# Patient Record
Sex: Male | Born: 1940
Health system: Southern US, Community
[De-identification: ages and names within clinical notes are randomized; demographics above are authoritative.]

## PROBLEM LIST (undated history)

## (undated) DIAGNOSIS — N189 Chronic kidney disease, unspecified: Secondary | ICD-10-CM

## (undated) DIAGNOSIS — E785 Hyperlipidemia, unspecified: Secondary | ICD-10-CM

## (undated) DIAGNOSIS — E119 Type 2 diabetes mellitus without complications: Secondary | ICD-10-CM

## (undated) DIAGNOSIS — I509 Heart failure, unspecified: Secondary | ICD-10-CM

## (undated) DIAGNOSIS — J449 Chronic obstructive pulmonary disease, unspecified: Secondary | ICD-10-CM

## (undated) DIAGNOSIS — I350 Nonrheumatic aortic (valve) stenosis: Secondary | ICD-10-CM

## (undated) DIAGNOSIS — G629 Polyneuropathy, unspecified: Secondary | ICD-10-CM

## (undated) DIAGNOSIS — R918 Other nonspecific abnormal finding of lung field: Secondary | ICD-10-CM

## (undated) HISTORY — PX: TOTAL KNEE ARTHROPLASTY: SHX125

## (undated) HISTORY — PX: ROTATOR CUFF REPAIR: SHX139

## (undated) SURGERY — EGD (ESOPHAGOGASTRODUODENOSCOPY)
Anesthesia: Monitor Anesthesia Care

---

## 2007-08-20 DIAGNOSIS — Z96619 Presence of unspecified artificial shoulder joint: Secondary | ICD-10-CM | POA: Insufficient documentation

## 2009-12-01 ENCOUNTER — Inpatient Hospital Stay (HOSPITAL_COMMUNITY): Admission: RE | Admit: 2009-12-01 | Discharge: 2009-12-06 | Payer: Self-pay | Admitting: Orthopaedic Surgery

## 2010-09-05 LAB — BASIC METABOLIC PANEL
BUN: 7 mg/dL (ref 6–23)
BUN: 8 mg/dL (ref 6–23)
CO2: 25 mEq/L (ref 19–32)
GFR calc Af Amer: >60 mL/min (ref >60)
Glucose, Bld: 118 mg/dL — ABNORMAL HIGH (ref 70–99)
Potassium: 4.1 mEq/L (ref 3.5–5.1)
Potassium: 4.1 mEq/L (ref 3.5–5.1)

## 2010-09-05 LAB — GLUCOSE, CAPILLARY
Glucose-Capillary: 128 mg/dL — ABNORMAL HIGH (ref 70–99)
Glucose-Capillary: 133 mg/dL — ABNORMAL HIGH (ref 70–99)
Glucose-Capillary: 137 mg/dL — ABNORMAL HIGH (ref 70–99)
Glucose-Capillary: 146 mg/dL — ABNORMAL HIGH (ref 70–99)
Glucose-Capillary: 152 mg/dL — ABNORMAL HIGH (ref 70–99)
Glucose-Capillary: 165 mg/dL — ABNORMAL HIGH (ref 70–99)
Glucose-Capillary: 171 mg/dL — ABNORMAL HIGH (ref 70–99)
Glucose-Capillary: 220 mg/dL — ABNORMAL HIGH (ref 70–99)
Glucose-Capillary: 70 mg/dL (ref 70–99)
Glucose-Capillary: 89 mg/dL (ref 70–99)

## 2010-09-05 LAB — CBC
HCT: 32.7 % — ABNORMAL LOW (ref 39.0–52.0)
Hemoglobin: 11.1 g/dL — ABNORMAL LOW (ref 13.0–17.0)
Platelets: 149 10*3/uL — ABNORMAL LOW (ref 150–400)
Platelets: 197 10*3/uL (ref 150–400)
RBC: 3.41 MIL/uL — ABNORMAL LOW (ref 4.22–5.81)
RDW: 16 % — ABNORMAL HIGH (ref 11.5–15.5)
RDW: 16.1 % — ABNORMAL HIGH (ref 11.5–15.5)
RDW: 16.2 % — ABNORMAL HIGH (ref 11.5–15.5)
WBC: 7.4 10*3/uL (ref 4.0–10.5)
WBC: 7.6 10*3/uL (ref 4.0–10.5)
WBC: 8.2 10*3/uL (ref 4.0–10.5)

## 2010-09-05 LAB — PROTIME-INR
INR: 1.1 (ref 0.00–1.49)
INR: 1.79 — ABNORMAL HIGH (ref 0.00–1.49)
INR: 2.07 — ABNORMAL HIGH (ref 0.00–1.49)
Prothrombin Time: 14.1 seconds (ref 11.6–15.2)
Prothrombin Time: 20.6 seconds — ABNORMAL HIGH (ref 11.6–15.2)

## 2010-09-06 LAB — COMPREHENSIVE METABOLIC PANEL
ALT: 14 U/L (ref 0–53)
AST: 19 U/L (ref 0–37)
Albumin: 4.1 g/dL (ref 3.5–5.2)
Alkaline Phosphatase: 65 U/L (ref 39–117)
BUN: 10 mg/dL (ref 6–23)
Chloride: 107 mEq/L (ref 96–112)
GFR calc Af Amer: >60 mL/min (ref >60)
Potassium: 4.3 mEq/L (ref 3.5–5.1)
Sodium: 135 mEq/L (ref 135–145)
Total Protein: 6.7 g/dL (ref 6.0–8.3)

## 2010-09-06 LAB — DIFFERENTIAL
Basophils Relative: 4 % — ABNORMAL HIGH (ref 0–1)
Eosinophils Relative: 3 % (ref 0–5)
Monocytes Absolute: 0.6 10*3/uL (ref 0.1–1.0)
Monocytes Relative: 11 % (ref 3–12)
Neutro Abs: 2.4 10*3/uL (ref 1.7–7.7)

## 2010-09-06 LAB — URINALYSIS, ROUTINE W REFLEX MICROSCOPIC
Bilirubin Urine: NEGATIVE
Glucose, UA: NEGATIVE mg/dL
Nitrite: NEGATIVE
Nitrite: NEGATIVE
Specific Gravity, Urine: 1.014 (ref 1.005–1.030)
Specific Gravity, Urine: 1.015 (ref 1.005–1.030)
pH: 5.5 (ref 5.0–8.0)
pH: 5.5 (ref 5.0–8.0)

## 2010-09-06 LAB — CBC
Platelets: 220 10*3/uL (ref 150–400)
RDW: 16.7 % — ABNORMAL HIGH (ref 11.5–15.5)
WBC: 5.6 10*3/uL (ref 4.0–10.5)

## 2010-09-06 LAB — URINE CULTURE
Colony Count: 30000
Colony Count: NO GROWTH

## 2010-09-06 LAB — GLUCOSE, CAPILLARY
Glucose-Capillary: 132 mg/dL — ABNORMAL HIGH (ref 70–99)
Glucose-Capillary: 134 mg/dL — ABNORMAL HIGH (ref 70–99)

## 2010-09-06 LAB — TYPE AND SCREEN: ABO/RH(D): O POS

## 2010-09-06 LAB — APTT: aPTT: 35 seconds (ref 24–37)

## 2011-07-20 DIAGNOSIS — I10 Essential (primary) hypertension: Secondary | ICD-10-CM | POA: Insufficient documentation

## 2011-07-27 DIAGNOSIS — M1990 Unspecified osteoarthritis, unspecified site: Secondary | ICD-10-CM | POA: Insufficient documentation

## 2011-07-27 DIAGNOSIS — M19011 Primary osteoarthritis, right shoulder: Secondary | ICD-10-CM | POA: Insufficient documentation

## 2011-07-27 DIAGNOSIS — E1149 Type 2 diabetes mellitus with other diabetic neurological complication: Secondary | ICD-10-CM | POA: Insufficient documentation

## 2011-07-31 DIAGNOSIS — I351 Nonrheumatic aortic (valve) insufficiency: Secondary | ICD-10-CM | POA: Insufficient documentation

## 2012-05-31 DIAGNOSIS — I739 Peripheral vascular disease, unspecified: Secondary | ICD-10-CM | POA: Insufficient documentation

## 2014-09-09 ENCOUNTER — Emergency Department (HOSPITAL_COMMUNITY): Payer: Medicare HMO

## 2014-09-09 ENCOUNTER — Encounter (HOSPITAL_COMMUNITY): Payer: Self-pay | Admitting: Neurology

## 2014-09-09 ENCOUNTER — Emergency Department (HOSPITAL_COMMUNITY)
Admission: EM | Admit: 2014-09-09 | Discharge: 2014-09-09 | Disposition: A | Discharge status: Home / Self Care | Payer: Medicare HMO | Attending: Emergency Medicine | Admitting: Emergency Medicine

## 2014-09-09 DIAGNOSIS — M25562 Pain in left knee: Secondary | ICD-10-CM | POA: Insufficient documentation

## 2014-09-09 DIAGNOSIS — I959 Hypotension, unspecified: Secondary | ICD-10-CM

## 2014-09-09 DIAGNOSIS — I9589 Other hypotension: Secondary | ICD-10-CM | POA: Diagnosis not present

## 2014-09-09 DIAGNOSIS — G629 Polyneuropathy, unspecified: Secondary | ICD-10-CM | POA: Diagnosis not present

## 2014-09-09 DIAGNOSIS — Z791 Long term (current) use of non-steroidal anti-inflammatories (NSAID): Secondary | ICD-10-CM | POA: Diagnosis not present

## 2014-09-09 DIAGNOSIS — Z79899 Other long term (current) drug therapy: Secondary | ICD-10-CM | POA: Diagnosis not present

## 2014-09-09 DIAGNOSIS — E119 Type 2 diabetes mellitus without complications: Secondary | ICD-10-CM | POA: Insufficient documentation

## 2014-09-09 HISTORY — DX: Type 2 diabetes mellitus without complications: E11.9

## 2014-09-09 HISTORY — DX: Polyneuropathy, unspecified: G62.9

## 2014-09-09 LAB — CBC WITH DIFFERENTIAL/PLATELET
BASOS PCT: 0 % (ref 0–1)
Basophils Absolute: 0 10*3/uL (ref 0.0–0.1)
EOS ABS: 0.1 10*3/uL (ref 0.0–0.7)
Eosinophils Relative: 2 % (ref 0–5)
HEMATOCRIT: 32.2 % — AB (ref 39.0–52.0)
Hemoglobin: 10.9 g/dL — ABNORMAL LOW (ref 13.0–17.0)
Lymphocytes Relative: 31 % (ref 12–46)
Lymphs Abs: 1.6 10*3/uL (ref 0.7–4.0)
MCH: 28.7 pg (ref 26.0–34.0)
MCHC: 33.9 g/dL (ref 30.0–36.0)
MCV: 84.7 fL (ref 78.0–100.0)
MONO ABS: 0.7 10*3/uL (ref 0.1–1.0)
MONOS PCT: 14 % — AB (ref 3–12)
Neutro Abs: 2.7 10*3/uL (ref 1.7–7.7)
Neutrophils Relative %: 53 % (ref 43–77)
Platelets: 225 10*3/uL (ref 150–400)
RBC: 3.8 MIL/uL — AB (ref 4.22–5.81)
RDW: 15.2 % (ref 11.5–15.5)
WBC: 5.1 10*3/uL (ref 4.0–10.5)

## 2014-09-09 LAB — URINALYSIS, ROUTINE W REFLEX MICROSCOPIC
BILIRUBIN URINE: NEGATIVE
HGB URINE DIPSTICK: NEGATIVE
KETONES UR: NEGATIVE mg/dL
LEUKOCYTES UA: NEGATIVE
Nitrite: NEGATIVE
Protein, ur: NEGATIVE mg/dL
SPECIFIC GRAVITY, URINE: 1.019 (ref 1.005–1.030)
Urobilinogen, UA: 0.2 mg/dL (ref 0.0–1.0)
pH: 5.5 (ref 5.0–8.0)

## 2014-09-09 LAB — URINE MICROSCOPIC-ADD ON

## 2014-09-09 LAB — I-STAT CG4 LACTIC ACID, ED: LACTIC ACID, VENOUS: 1.91 mmol/L (ref 0.5–2.0)

## 2014-09-09 LAB — SEDIMENTATION RATE: SED RATE: 28 mm/h — AB (ref 0–16)

## 2014-09-09 MED ORDER — HYDROCODONE-ACETAMINOPHEN 5-325 MG PO TABS: 1.0000 | ORAL_TABLET | ORAL | Status: DC | PRN

## 2014-09-09 MED ORDER — SODIUM CHLORIDE 0.9 % IV BOLUS (SEPSIS)
1000.0000 mL | Freq: Once | INTRAVENOUS | Status: AC
  Administered 2014-09-09: 1000 mL via INTRAVENOUS

## 2014-09-09 NOTE — Discharge Instructions (Signed)
Follow-up with the orthopedic doctor for reassessment of your knee pain that you have had for the past 4 weeks. For severe pain take norco or vicodin however realize they have the potential for addiction and it can make you sleepy and has tylenol in it.  No operating machinery while taking.  If you were given medicines take as directed.  If you are on coumadin or contraceptives realize their levels and effectiveness is altered by many different medicines.  If you have any reaction (rash, tongues swelling, other) to the medicines stop taking and see a physician.   Please follow up as directed and return to the ER or see a physician for new or worsening symptoms.  Thank you. Filed Vitals:   09/09/14 1830 09/09/14 1845 09/09/14 1851 09/09/14 2029  BP: 89/56 104/55  110/59  Pulse: 65 77  69  Temp:   98.8 F (37.1 C)   TempSrc:   Rectal   Resp: 16 17  22   Height:      Weight:      SpO2: 95% 96%  99%

## 2014-09-09 NOTE — ED Notes (Signed)
Pt reports left knee pain for 1 year but has gotten worse, denies injury. States he has has neuropathy. No swelling to knee noted.

## 2014-09-09 NOTE — ED Provider Notes (Signed)
CSN: [id number]     Arrival date & time 09/09/14  1654 History   First MD Initiated Contact with Patient 09/09/14 1741     Chief Complaint  Patient presents with  . Knee Pain     (Consider location/radiation/quality/duration/timing/severity/associated sxs/prior Treatment) HPI Comments: 74 year old male with history of left knee replacement 4 years ago by Dr. Dwain Sarna in [place], diabetes, neuropathy, nonsmoker, no illegal drugs presents for worsening left knee pain for the past 4 weeks. Patient has had it for 1 year however significant worsened recently. No significant swelling noted no fevers or chills. Mild decreased oral intake recently. No respiratory symptoms no urinary symptoms. Worse with range of motion.  Pt did have a fall recently onto both knees.   Patient is a 74 y.o. male presenting with knee pain. The history is provided by the patient.  Knee Pain Associated symptoms: no back pain, no fever and no neck pain     Past Medical History  Diagnosis Date  . Diabetes mellitus without complication   . Neuropathy    Past Surgical History  Procedure Laterality Date  . Total knee arthroplasty     No family history on file. History  Substance Use Topics  . Smoking status: Never Smoker   . Smokeless tobacco: Not on file  . Alcohol Use: No    Review of Systems  Constitutional: Negative for fever and chills.  HENT: Negative for congestion.   Eyes: Negative for visual disturbance.  Respiratory: Negative for shortness of breath.   Cardiovascular: Negative for chest pain.  Gastrointestinal: Negative for vomiting and abdominal pain.  Genitourinary: Negative for dysuria and flank pain.  Musculoskeletal: Positive for joint swelling (right knee, non tender). Negative for back pain, neck pain and neck stiffness.  Skin: Negative for rash.  Neurological: Negative for light-headedness and headaches.      Allergies  Review of patient's allergies indicates no known  allergies.  Home Medications   Prior to Admission medications   Medication Sig Start Date End Date Taking? Authorizing Provider  canagliflozin (INVOKANA) 300 MG TABS tablet Take 300 mg by mouth daily before breakfast.   Yes Historical Provider, MD  gabapentin (NEURONTIN) 300 MG capsule Take 300 mg by mouth at bedtime.   Yes Historical Provider, MD  Liraglutide 18 MG/3ML SOPN Inject 18 mg into the skin daily.   Yes Historical Provider, MD  losartan (COZAAR) 25 MG tablet Take 25 mg by mouth daily.   Yes Historical Provider, MD  meloxicam (MOBIC) 7.5 MG tablet Take 7.5 mg by mouth 2 (two) times daily.   Yes Historical Provider, MD  metFORMIN (GLUCOPHAGE) 1000 MG tablet Take 1,000 mg by mouth 2 (two) times daily with a meal.   Yes Historical Provider, MD  mirtazapine (REMERON) 15 MG tablet Take 15 mg by mouth at bedtime.   Yes Historical Provider, MD  pioglitazone (ACTOS) 30 MG tablet Take 30 mg by mouth daily.   Yes Historical Provider, MD  HYDROcodone-acetaminophen (NORCO) 5-325 MG per tablet Take 1 tablet by mouth every 4 (four) hours as needed. 09/09/14   Blane Ohara, MD   BP 110/59 mmHg  Pulse 69  Temp(Src) 98.8 F (37.1 C) (Rectal)  Resp 22  Ht  (1.854 m)  Wt 200 lb (90.719 kg)  BMI 26.39 kg/m2  SpO2 99% Physical Exam  Constitutional: He is oriented to person, place, and time. He appears well-developed and well-nourished.  HENT:  Head: Normocephalic and atraumatic.  Mild dry mucous membranes  Eyes: Conjunctivae  are normal. Right eye exhibits no discharge. Left eye exhibits no discharge.  Neck: Normal range of motion. Neck supple. No tracheal deviation present.  Cardiovascular: Normal rate and regular rhythm.   Pulmonary/Chest: Effort normal and breath sounds normal.  Abdominal: Soft. He exhibits no distension. There is no tenderness. There is no guarding.  Musculoskeletal: He exhibits tenderness. He exhibits no edema.  Patient is mild tenderness left knee with flexion and  lateral joint line. No calf tenderness or groin tenderness. No warmth or significant swelling to left knee. Patient has mild effusion right knee however nontender for range of motion of both knees. Neurovascular intact.  Neurological: He is alert and oriented to person, place, and time.  Skin: Skin is warm. No rash noted.  Psychiatric: He has a normal mood and affect.  Nursing note and vitals reviewed.   ED Course  Procedures (including critical care time) Labs Review Labs Reviewed  CBC WITH DIFFERENTIAL/PLATELET - Abnormal; Notable for the following:    RBC 3.80 (*)    Hemoglobin 10.9 (*)    HCT 32.2 (*)    Monocytes Relative 14 (*)    All other components within normal limits  URINALYSIS, ROUTINE W REFLEX MICROSCOPIC - Abnormal; Notable for the following:    Glucose, UA >1000 (*)    All other components within normal limits  SEDIMENTATION RATE - Abnormal; Notable for the following:    Sed Rate 28 (*)    All other components within normal limits  URINE CULTURE  URINE MICROSCOPIC-ADD ON  BASIC METABOLIC PANEL  TROPONIN I  C-REACTIVE PROTEIN  I-STAT CG4 LACTIC ACID, ED  POC OCCULT BLOOD, ED    Imaging Review Dg Chest Port 1 View  09/09/2014   CLINICAL DATA:  Hypotension, left knee pain  EXAM: PORTABLE CHEST - 1 VIEW  COMPARISON:  11/25/2009  FINDINGS: Lungs are clear.  No pleural effusion or pneumothorax.  The heart is top-normal in size.  Right shoulder arthroplasty.  IMPRESSION: No evidence of acute cardiopulmonary disease.   Electronically Signed   By: Charline BillsSriyesh  Krishnan M.D.   On: 09/09/2014 19:06   Dg Knee Left Port  09/09/2014   CLINICAL DATA:  Left knee pain x1 year, no known injury  EXAM: PORTABLE LEFT KNEE - 1-2 VIEW  COMPARISON:  None.  FINDINGS: Left total knee arthroplasty. No evidence of hardware fracture or loosening.  No fracture or dislocation is seen.  Visualized soft tissues are within normal limits.  No suprapatellar knee joint effusion.  IMPRESSION: Left knee  arthroplasty without evidence of complication.  No fracture or dislocation is seen.   Electronically Signed   By: Charline BillsSriyesh  Krishnan M.D.   On: 09/09/2014 19:02   Dg Knee Right Port  09/09/2014   CLINICAL DATA:  Fall, right knee pain/swelling  EXAM: PORTABLE RIGHT KNEE - 1-2 VIEW  COMPARISON:  None.  FINDINGS: No fracture or dislocation is seen.  Severe tricompartmental degenerative changes.  Moderate suprapatellar knee joint effusion.  IMPRESSION: No fracture or dislocation is seen.  Severe degenerative changes.  Moderate suprapatellar knee joint effusion.   Electronically Signed   By: Charline BillsSriyesh  Krishnan M.D.   On: 09/09/2014 20:43     EKG Interpretation   Date/Time:  Tuesday September 09 2014 17:46:29 EDT Ventricular Rate:  75 PR Interval:  188 QRS Duration: 107 QT Interval:  397 QTC Calculation: 443 R Axis:   -44 Text Interpretation:  Sinus rhythm Consider left atrial enlargement Left  axis deviation Abnormal R-wave progression, early transition  Confirmed by  Jodi Mourning  MD, Marshell Rieger ([id number]) on 09/09/2014 7:23:48 PM      MDM   Final diagnoses:  Hypotension  Transient hypotension  Knee pain, acute, left   Patient presents for worsening left knee pain clinical concern for arthritis, history of knee replacement 4 years ago. No swelling to the knee or in clinically for septic joint. Clinical concern for blood pressure 80 systolic. Patient has no other symptoms. For possible septic workup including urine, chest x-ray, knee x-ray, blood work and sedimentation rate. Patient well-appearing in clinically dehydrated. Fluid boluses given. Patient denies recent change in medications or any cardiac history. Hypotensive workup initiated.  Patient improved significantly in the ER, blood pressure improved to 110 systolic, patient has no fever no obvious source of infection on exam screening workup in ER unremarkable, patient and family are comfortable following up with primary Dr. in orthopedics  outpatient. Xrays reviewed no fx, right knee effusion Results and differential diagnosis were discussed with the patient/parent/guardian. Close follow up outpatient was discussed, comfortable with the plan.   Medications  sodium chloride 0.9 % bolus 1,000 mL (0 mLs Intravenous Stopped 09/09/14 2001)  sodium chloride 0.9 % bolus 1,000 mL (0 mLs Intravenous Stopped 09/09/14 2000)    Filed Vitals:   09/09/14 1830 09/09/14 1845 09/09/14 1851 09/09/14 2029  BP: 89/56 104/55  110/59  Pulse: 65 77  69  Temp:   98.8 F (37.1 C)   TempSrc:   Rectal   Resp: Height:      Weight:      SpO2: 95% 96%  99%    Final diagnoses:  Hypotension  Transient hypotension  Knee pain, acute, left        Blane Ohara, MD 09/09/14 2114

## 2014-09-09 NOTE — ED Notes (Signed)
Patient able to ambulate with cane, Patient family requesting the RT knee to be x-rayed MD made aware.

## 2014-09-10 LAB — C-REACTIVE PROTEIN: CRP: <0.5 mg/dL — ABNORMAL LOW (ref <0.60)

## 2014-09-11 LAB — URINE CULTURE
Colony Count: NO GROWTH
Culture: NO GROWTH

## 2014-09-14 ENCOUNTER — Other Ambulatory Visit (HOSPITAL_COMMUNITY): Payer: Self-pay

## 2015-10-26 ENCOUNTER — Other Ambulatory Visit: Payer: Self-pay | Admitting: *Deleted

## 2015-10-26 ENCOUNTER — Encounter: Payer: Self-pay | Admitting: Vascular Surgery

## 2015-10-26 DIAGNOSIS — I739 Peripheral vascular disease, unspecified: Secondary | ICD-10-CM

## 2016-01-26 ENCOUNTER — Encounter (HOSPITAL_COMMUNITY): Payer: Medicare HMO

## 2016-01-26 ENCOUNTER — Encounter: Payer: Medicare HMO | Admitting: Vascular Surgery

## 2016-08-01 ENCOUNTER — Encounter: Payer: Self-pay | Admitting: Cardiology

## 2016-09-01 ENCOUNTER — Ambulatory Visit (INDEPENDENT_AMBULATORY_CARE_PROVIDER_SITE_OTHER): Payer: Medicare HMO | Admitting: Cardiology

## 2016-09-01 ENCOUNTER — Encounter: Payer: Self-pay | Admitting: Cardiology

## 2016-09-01 ENCOUNTER — Telehealth: Payer: Self-pay | Admitting: Cardiology

## 2016-09-01 VITALS — BP 107/66 | HR 75 | Ht 73.0 in | Wt 195.8 lb

## 2016-09-01 DIAGNOSIS — I350 Nonrheumatic aortic (valve) stenosis: Secondary | ICD-10-CM

## 2016-09-01 DIAGNOSIS — E782 Mixed hyperlipidemia: Secondary | ICD-10-CM

## 2016-09-01 DIAGNOSIS — I10 Essential (primary) hypertension: Secondary | ICD-10-CM

## 2016-09-01 DIAGNOSIS — E119 Type 2 diabetes mellitus without complications: Secondary | ICD-10-CM | POA: Diagnosis not present

## 2016-09-01 NOTE — Patient Instructions (Signed)
Your physician recommends that you schedule a follow-up appointment in: 6 weeks with Dr. Wyline MoodBranch.  Your physician recommends that you continue on your current medications as directed. Please refer to the Current Medication list given to you today.  Your physician has requested that you have an echocardiogram. Echocardiography is a painless test that uses sound waves to create images of your heart. It provides your doctor with information about the size and shape of your heart and how well your heart's chambers and valves are working. This procedure takes approximately one hour. There are no restrictions for this procedure.

## 2016-09-01 NOTE — Progress Notes (Signed)
Clinical Summary Tristan Brooks is a 76 y.o.male seen today as a new patient, previously followed by [facility] cardiology. He is referred by Dr Sherryll Burger.   1. Aortic stenosis - echo at [facility] internal medicine 07/2016 LVEF 50-55%, inferolateral hypokinesis. Moderate AS, moderate AI. From notes LVOT not accurately measured, the reported AVA 0.49 based on AVA vmax, no AVA VTI reported. . There is no AV mean gradient reported. Peak velocity 3.3 m/s. AI PHT 440 ms. LVOT diameter 1.54 from recent study, the LVOT used in the 2013 study was 2.2 - echo 2013 mod AS: AVA 1.1, mean grade 20.  - he denies any SOB/DOE. Highest level of activity is loading his wood stove. Sedentary lifestyle - can have some dizziness. No chest pain. No recent syncope.   2. HTN - compliant with meds   3. DM2 - compliant with meds. No recent HgbA1c in our system.    4. Hyperlipidemia - compliant with statin.     Past Medical History:  Diagnosis Date  . Diabetes mellitus without complication   . Neuropathy      No Known Allergies   Current Outpatient Prescriptions  Medication Sig Dispense Refill  . canagliflozin (INVOKANA) 300 MG TABS tablet Take 300 mg by mouth daily before breakfast.    . gabapentin (NEURONTIN) 300 MG capsule Take 300 mg by mouth at bedtime.    Marland Kitchen HYDROcodone-acetaminophen (NORCO) 5-325 MG per tablet Take 1 tablet by mouth every 4 (four) hours as needed. 6 tablet 0  . Liraglutide 18 MG/3ML SOPN Inject 18 mg into the skin daily.    Marland Kitchen losartan (COZAAR) 25 MG tablet Take 25 mg by mouth daily.    . meloxicam (MOBIC) 7.5 MG tablet Take 7.5 mg by mouth 2 (two) times daily.    . metFORMIN (GLUCOPHAGE) 1000 MG tablet Take 1,000 mg by mouth 2 (two) times daily with a meal.    . mirtazapine (REMERON) 15 MG tablet Take 15 mg by mouth at bedtime.    . pioglitazone (ACTOS) 30 MG tablet Take 30 mg by mouth daily.     No current facility-administered medications for this visit.      Past Surgical  History:  Procedure Laterality Date  . TOTAL KNEE ARTHROPLASTY       No Known Allergies    No family history on file.   Social History Mr. Griesinger reports that he has never smoked. He does not have any smokeless tobacco history on file. Mr. Arwood reports that he does not drink alcohol.   Review of Systems CONSTITUTIONAL: No weight loss, fever, chills, weakness or fatigue.  HEENT: Eyes: No visual loss, blurred vision, double vision or yellow sclerae.No hearing loss, sneezing, congestion, runny nose or sore throat.  SKIN: No rash or itching.  CARDIOVASCULAR: per HPI RESPIRATORY: No shortness of breath, cough or sputum.  GASTROINTESTINAL: No anorexia, nausea, vomiting or diarrhea. No abdominal pain or blood.  GENITOURINARY: No burning on urination, no polyuria NEUROLOGICAL: No headache, dizziness, syncope, paralysis, ataxia, numbness or tingling in the extremities. No change in bowel or bladder control.  MUSCULOSKELETAL: No muscle, back pain, joint pain or stiffness.  LYMPHATICS: No enlarged nodes. No history of splenectomy.  PSYCHIATRIC: No history of depression or anxiety.  ENDOCRINOLOGIC: No reports of sweating, cold or heat intolerance. No polyuria or polydipsia.  Marland Kitchen   Physical Examination Vitals:   09/01/16 1500  BP: 107/66  Pulse: 75   Vitals:   09/01/16 1500  Weight: 195 lb 12.8  oz (88.8 kg)  Height: 6\' 1"  (1.854 m)    Gen: resting comfortably, no acute distress HEENT: no scleral icterus, pupils equal round and reactive, no palptable cervical adenopathy,  CV: RRR,3/6 systolic murmur RUSB, no jvd Resp: Clear to auscultation bilaterally GI: abdomen is soft, non-tender, non-distended, normal bowel sounds, no hepatosplenomegaly MSK: extremities are warm, no edema.  Skin: warm, no rash Neuro:  no focal deficits Psych: appropriate affect     Assessment and Plan  1. Aortic stenosis - recent echo parameters regarding severity of aortic stenosis are unclear  as described above. From report technically difficult study, particularly the evaluation of the LVOT - we will repeat echo to better determine severity of his aortic stenosis.  - sedentary lifestyle, difficult to assess functional capacity. Discussed increasing his level of activity and monitoring for symptoms.  2. HTN - at goal, continue current meds  3. DM2 - he is on invokana, metformin, pioglitazone. Request labs from pcp.   4. Hyperlipidemia - request pcp labs. Continue statin. Pending labs consider moderate strength statin given his DM2 history.     Antoine PocheJonathan F. Jerald Hennington, M.D.

## 2016-09-01 NOTE — Telephone Encounter (Signed)
Pre-cert Verification for the following procedure    [facility]

## 2016-09-22 ENCOUNTER — Other Ambulatory Visit: Payer: Self-pay

## 2016-09-22 ENCOUNTER — Ambulatory Visit (INDEPENDENT_AMBULATORY_CARE_PROVIDER_SITE_OTHER): Payer: Medicare HMO

## 2016-09-22 DIAGNOSIS — I350 Nonrheumatic aortic (valve) stenosis: Secondary | ICD-10-CM

## 2016-09-28 ENCOUNTER — Telehealth: Payer: Self-pay | Admitting: *Deleted

## 2016-09-28 NOTE — Telephone Encounter (Signed)
-----   Message from Antoine Poche, MD sent at 09/27/2016 10:17 AM EDT ----- Echo shows that his aortic valve stenosis is in the moderate to severe range. We will discuss further at our follow up, whether we continue to monitor closely or consider additional testing   Dominga Ferry MD

## 2016-09-28 NOTE — Telephone Encounter (Signed)
Pt voiced understanding - confirmed 4/27 appt - routed to pcp

## 2016-10-14 ENCOUNTER — Encounter: Payer: Self-pay | Admitting: Cardiology

## 2016-10-14 ENCOUNTER — Ambulatory Visit (INDEPENDENT_AMBULATORY_CARE_PROVIDER_SITE_OTHER): Payer: Medicare HMO | Admitting: Cardiology

## 2016-10-14 VITALS — BP 99/63 | HR 65 | Ht 73.0 in | Wt 196.6 lb

## 2016-10-14 DIAGNOSIS — I350 Nonrheumatic aortic (valve) stenosis: Secondary | ICD-10-CM

## 2016-10-14 NOTE — Progress Notes (Signed)
Clinical Summary Tristan Brooks is a 76 y.o.male seen today for follow up of the following medical problems. This is a focused visit on his history of aortic stenosis.   1. Aortic stenosis - echo at [facility] internal medicine 07/2016 LVEF 50-55%, inferolateral hypokinesis. Moderate AS, moderate AI. From notes LVOT not accurately measured, the reported AVA 0.49 based on AVA vmax, no AVA VTI reported. . There is no AV mean gradient reported. Peak velocity 3.3 m/s. AI PHT 440 ms. LVOT diameter 1.54 from recent study, the LVOT used in the 2013 study was 2.2 - echo 2013 mod AS: AVA 1.1, mean grade 20.  - repeat echo 09/2016 mean grad 30, AVA VTI 0.83, VTI dimensionless index 0.24. Overall moderate to severe AS.  - DOE at 1 block, 1 flight stairs overall stable. Somewhat difficult of a historian to precisely describe his exercise tolerance.     Past Medical History:  Diagnosis Date  . Diabetes mellitus without complication (HCC)   . Neuropathy (HCC)      No Known Allergies   Current Outpatient Prescriptions  Medication Sig Dispense Refill  . atorvastatin (LIPITOR) 10 MG tablet Take 10 mg by mouth daily.    . canagliflozin (INVOKANA) 300 MG TABS tablet Take 300 mg by mouth daily before breakfast.    . gabapentin (NEURONTIN) 300 MG capsule Take 300 mg by mouth at bedtime.    . meloxicam (MOBIC) 7.5 MG tablet Take 7.5 mg by mouth 2 (two) times daily.    . metFORMIN (GLUCOPHAGE) 1000 MG tablet Take 1,000 mg by mouth 2 (two) times daily with a meal.    . mirtazapine (REMERON) 15 MG tablet Take 15 mg by mouth at bedtime.    . Omega-3 Fatty Acids (FISH OIL) 1360 MG CAPS Take 1 capsule by mouth daily.    Marland Kitchen omeprazole (PRILOSEC) 20 MG capsule Take 20 mg by mouth daily.    . pioglitazone (ACTOS) 30 MG tablet Take 30 mg by mouth daily.     No current facility-administered medications for this visit.      Past Surgical History:  Procedure Laterality Date  . TOTAL KNEE ARTHROPLASTY       No  Known Allergies    No family history on file.   Social History Tristan Brooks reports that he quit smoking about 7 months ago. He has never used smokeless tobacco. Tristan Brooks reports that he does not drink alcohol.   Review of Systems CONSTITUTIONAL: No weight loss, fever, chills, weakness or fatigue.  HEENT: Eyes: No visual loss, blurred vision, double vision or yellow sclerae.No hearing loss, sneezing, congestion, runny nose or sore throat.  SKIN: No rash or itching.  CARDIOVASCULAR: per hpi RESPIRATORY: No shortness of breath, cough or sputum.  GASTROINTESTINAL: No anorexia, nausea, vomiting or diarrhea. No abdominal pain or blood.  GENITOURINARY: No burning on urination, no polyuria NEUROLOGICAL: No headache, dizziness, syncope, paralysis, ataxia, numbness or tingling in the extremities. No change in bowel or bladder control.  MUSCULOSKELETAL: No muscle, back pain, joint pain or stiffness.  LYMPHATICS: No enlarged nodes. No history of splenectomy.  PSYCHIATRIC: No history of depression or anxiety.  ENDOCRINOLOGIC: No reports of sweating, cold or heat intolerance. No polyuria or polydipsia.  Marland Kitchen   Physical Examination Vitals:   10/14/16 1106  BP: 99/63  Pulse: 65   Vitals:   10/14/16 1106  Weight: 196 lb 9.6 oz (89.2 kg)  Height:  (1.854 m)    Gen: resting comfortably, no  acute distress HEENT: no scleral icterus, pupils equal round and reactive, no palptable cervical adenopathy,  CV: RRR, no m/r/g, no jvd Resp: Clear to auscultation bilaterally GI: abdomen is soft, non-tender, non-distended, normal bowel sounds, no hepatosplenomegaly MSK: extremities are warm, no edema.  Skin: warm, no rash Neuro:  no focal deficits Psych: appropriate affect   Diagnostic Studies  09/2016 echo Study Conclusions  - Left ventricle: The cavity size was normal. Wall thickness was   normal. Systolic function was at the lower limits of normal. The   estimated ejection fraction  was in the range of 50% to 55%.   Doppler parameters are consistent with abnormal left ventricular   relaxation (grade 1 diastolic dysfunction). Doppler parameters   are consistent with high ventricular filling pressure. - Regional wall motion abnormality: Mild hypokinesis of the   basal-mid inferior and mid inferolateral myocardium. - Aortic valve: Moderately to severely calcified annulus.   Moderately thickened, moderately calcified leaflets. There was   moderate to severe stenosis. There was mild to moderate   regurgitation. Peak velocity (S): 377 cm/s. Mean gradient (S): 30   mm Hg. Valve area (VTI): 0.83 cm^2. Valve area (Vmax): 0.78 cm^2.   Valve area (Vmean): 0.8 cm^2. - Aorta: Mild aortic root dilatation. Aortic root dimension: 40 mm   (ED). - Mitral valve: Moderately to severely calcified annulus. - Left atrium: The atrium was severely dilated.   Assessment and Plan  1. Aortic stenosis - moderate to severe AS by recent echo. - somewhat difficult of a historian to assess his functional capacity. Appears at this time not to have significant symptoms - we will continue to follow closesly for progression of stenosis and potential onset of symptoms.    F/u 3 months       Antoine Poche, M.D.

## 2016-10-14 NOTE — Patient Instructions (Signed)
Medication Instructions:  Continue all current medications.  Labwork: none  Testing/Procedures: none  Follow-Up: 3 months   Any Other Special Instructions Will Be Listed Below (If Applicable).  If you need a refill on your cardiac medications before your next appointment, please call your pharmacy.  

## 2017-01-03 ENCOUNTER — Telehealth (INDEPENDENT_AMBULATORY_CARE_PROVIDER_SITE_OTHER): Payer: Self-pay | Admitting: *Deleted

## 2017-01-03 NOTE — Telephone Encounter (Signed)
Pt has a referral for his knee, but while scheduling his appt he stated he sees Dr. Cleophas DunkerWhitfield but needs to see Dr. Alvester MorinNewton again for an injection.

## 2017-01-06 ENCOUNTER — Ambulatory Visit: Payer: Medicare HMO | Admitting: Cardiology

## 2017-01-06 ENCOUNTER — Encounter: Payer: Self-pay | Admitting: Cardiology

## 2017-01-06 NOTE — Progress Notes (Deleted)
Clinical Summary [name] is a 76 y.o.male  1. Aortic stenosis - echo at [facility] internal medicine 07/2016 LVEF 50-55%, inferolateral hypokinesis. Moderate AS, moderate AI. From notes LVOT not accurately measured, the reported AVA 0.49 based on AVA vmax, no AVA VTI reported. . There is no AV mean gradient reported. Peak velocity 3.3 m/s. AI PHT 440 ms. LVOT diameter 1.54 from recent study, the LVOT used in the 2013 study was 2.2 - echo 2013 mod AS: AVA 1.1, mean grade 20.  - repeat echo 09/2016 mean grad 30, AVA VTI 0.83, VTI dimensionless index 0.24. Overall moderate to severe AS.  - DOE at 1 block, 1 flight stairs overall stable. Somewhat difficult of a historian to precisely describe his exercise tolerance.    2. HTN - compliant with meds   3. DM2 - compliant with meds. No recent HgbA1c in our system.    4. Hyperlipidemia - compliant with statin.     Past Medical History:  Diagnosis Date  . Diabetes mellitus without complication (HCC)   . Neuropathy      No Known Allergies   Current Outpatient Prescriptions  Medication Sig Dispense Refill  . aspirin 325 MG tablet Take by mouth.    Marland Kitchen atorvastatin (LIPITOR) 10 MG tablet Take 10 mg by mouth daily.    . canagliflozin (INVOKANA) 300 MG TABS tablet Take 300 mg by mouth daily before breakfast.    . gabapentin (NEURONTIN) 300 MG capsule Take 300 mg by mouth at bedtime.    . meloxicam (MOBIC) 7.5 MG tablet Take 7.5 mg by mouth 2 (two) times daily.    . metFORMIN (GLUCOPHAGE) 1000 MG tablet Take 1,000 mg by mouth 2 (two) times daily with a meal.    . mirtazapine (REMERON) 15 MG tablet Take 15 mg by mouth at bedtime.    . Omega-3 Fatty Acids (FISH OIL) 1360 MG CAPS Take 1 capsule by mouth daily.    Marland Kitchen omeprazole (PRILOSEC) 20 MG capsule Take 20 mg by mouth daily.    . pioglitazone (ACTOS) 30 MG tablet Take 30 mg by mouth daily.     No current facility-administered medications for this visit.      Past Surgical  History:  Procedure Laterality Date  . TOTAL KNEE ARTHROPLASTY       No Known Allergies    Family History  Problem Relation Age of Onset  . Heart Problems Father   . Heart attack Sister      Social History [name] reports that he quit smoking about 10 months ago. He has never used smokeless tobacco. [name] reports that he does not drink alcohol.   Review of Systems CONSTITUTIONAL: No weight loss, fever, chills, weakness or fatigue.  HEENT: Eyes: No visual loss, blurred vision, double vision or yellow sclerae.No hearing loss, sneezing, congestion, runny nose or sore throat.  SKIN: No rash or itching.  CARDIOVASCULAR:  RESPIRATORY: No shortness of breath, cough or sputum.  GASTROINTESTINAL: No anorexia, nausea, vomiting or diarrhea. No abdominal pain or blood.  GENITOURINARY: No burning on urination, no polyuria NEUROLOGICAL: No headache, dizziness, syncope, paralysis, ataxia, numbness or tingling in the extremities. No change in bowel or bladder control.  MUSCULOSKELETAL: No muscle, back pain, joint pain or stiffness.  LYMPHATICS: No enlarged nodes. No history of splenectomy.  PSYCHIATRIC: No history of depression or anxiety.  ENDOCRINOLOGIC: No reports of sweating, cold or heat intolerance. No polyuria or polydipsia.  Marland Kitchen   Physical Examination There were no  vitals filed for this visit. There were no vitals filed for this visit.  Gen: resting comfortably, no acute distress HEENT: no scleral icterus, pupils equal round and reactive, no palptable cervical adenopathy,  CV Resp: Clear to auscultation bilaterally GI: abdomen is soft, non-tender, non-distended, normal bowel sounds, no hepatosplenomegaly MSK: extremities are warm, no edema.  Skin: warm, no rash Neuro:  no focal deficits Psych: appropriate affect   Diagnostic Studies 09/2016 echo Study Conclusions  - Left ventricle: The cavity size was normal. Wall thickness was normal. Systolic function  was at the lower limits of normal. The estimated ejection fraction was in the range of 50% to 55%. Doppler parameters are consistent with abnormal left ventricular relaxation (grade 1 diastolic dysfunction). Doppler parameters are consistent with high ventricular filling pressure. - Regional wall motion abnormality: Mild hypokinesis of the basal-mid inferior and mid inferolateral myocardium. - Aortic valve: Moderately to severely calcified annulus. Moderately thickened, moderately calcified leaflets. There was moderate to severe stenosis. There was mild to moderate regurgitation. Peak velocity (S): 377 cm/s. Mean gradient (S): 30 mm Hg. Valve area (VTI): 0.83 cm^2. Valve area (Vmax): 0.78 cm^2. Valve area (Vmean): 0.8 cm^2. - Aorta: Mild aortic root dilatation. Aortic root dimension: 40 mm (ED). - Mitral valve: Moderately to severely calcified annulus. - Left atrium: The atrium was severely dilated.     Assessment and Plan   1. Aortic stenosis - moderate to severe AS by recent echo. - somewhat difficult of a historian to assess his functional capacity. Appears at this time not to have significant symptoms - we will continue to follow closesly for progression of stenosis and potential onset of symptoms.    Marland Kitchen. HTN - at goal, continue current meds  3. DM2 - he is on invokana, metformin, pioglitazone. Request labs from pcp.   4. Hyperlipidemia - request pcp labs. Continue statin. Pending labs consider moderate strength statin given his DM2 history.      Tristan PocheJonathan F. Elice Brooks, M.D., F.A.C.C.

## 2017-02-15 ENCOUNTER — Telehealth (INDEPENDENT_AMBULATORY_CARE_PROVIDER_SITE_OTHER): Payer: Self-pay

## 2017-02-15 ENCOUNTER — Encounter (INDEPENDENT_AMBULATORY_CARE_PROVIDER_SITE_OTHER): Payer: Self-pay | Admitting: Physical Medicine and Rehabilitation

## 2017-02-15 ENCOUNTER — Ambulatory Visit (INDEPENDENT_AMBULATORY_CARE_PROVIDER_SITE_OTHER): Payer: Medicare HMO | Admitting: Physical Medicine and Rehabilitation

## 2017-02-15 VITALS — BP 109/63 | HR 65

## 2017-02-15 DIAGNOSIS — M5416 Radiculopathy, lumbar region: Secondary | ICD-10-CM

## 2017-02-15 DIAGNOSIS — G8929 Other chronic pain: Secondary | ICD-10-CM

## 2017-02-15 DIAGNOSIS — R202 Paresthesia of skin: Secondary | ICD-10-CM | POA: Diagnosis not present

## 2017-02-15 DIAGNOSIS — M25562 Pain in left knee: Secondary | ICD-10-CM | POA: Diagnosis not present

## 2017-02-15 NOTE — Telephone Encounter (Signed)
Needs precert for Lt [id number]

## 2017-02-15 NOTE — Progress Notes (Signed)
ISAIC SYLER - 76 y.o. male MRN [id number]  Date of birth: December 30, 1940  Office Visit Note: Visit Date: 02/15/2017 PCP: Kirstie Peri, MD Referred by: Kirstie Peri, MD  Subjective: Chief Complaint  Patient presents with  . Lower Back - Pain  . Left Hip - Pain, Numbness   HPI: Mr. Pellow is a 76 year old gentleman with significant diabetes and what is likely significant polyneuropathy. I saw Mr. Kubicek a year ago and completed left L3 and L4 transforaminal epidural steroid injection for what was left hip and thigh pain down to the knee. He is followed by Dr. Cleophas Dunker but has not seen either of [place] since last year. He reports the injection at that time helped up until just the last couple of months and he's been having worsening left hip and leg pain to the knee. He does report some numbness and tingling in the leg as well. He does not feel like it is bilateral. He really denies specific back pain. He reports no specific trauma since that time and otherwise pretty stable health condition. MRI at the time which is again reviewed below showed L2 factorial severe stenosis at L3-4 L4-5 and even some at L5-S1. There was no listhesis. He actually did have some epidural lipomatosis likely from the diabetes as he is not a very large individual. He reports again the injection helped quite a bit. He has not noticed any new weakness. His symptoms are worse with standing and much less with sitting. Reports a lot of pain with lying down flat. He reports fairly constant pain in general. He uses gabapentin and Tylenol.    Review of Systems  Constitutional: Negative for chills, fever, malaise/fatigue and weight loss.  HENT: Negative for hearing loss and sinus pain.   Eyes: Negative for blurred vision, double vision and photophobia.  Respiratory: Negative for cough and shortness of breath.   Cardiovascular: Negative for chest pain, palpitations and leg swelling.  Gastrointestinal: Negative for abdominal  pain, nausea and vomiting.  Genitourinary: Negative for flank pain.  Musculoskeletal: Positive for back pain and joint pain. Negative for myalgias.  Skin: Negative for itching and rash.  Neurological: Positive for tingling. Negative for tremors, focal weakness and weakness.  Endo/Heme/Allergies: Negative.   Psychiatric/Behavioral: Negative for depression.  All other systems reviewed and are negative.  Otherwise per HPI.  Assessment & Plan: Visit Diagnoses:  1. Lumbar radiculopathy   2. Paresthesia of skin   3. Chronic pain of left knee     Plan: Findings:  Chronic worsening severe at times but constant left hip and leg pain down to the knee which is more of an L3 or L4 distribution. He does get some paresthesias. Exam shows weakness of the bilateral feet with dorsiflexion. He also has some wasting of the first dorsal interosseous muscle of the hands bilaterally with significant osteoarthritic findings of the hands. I think he probably has ongoing changes with diabetic polyneuropathy. He is a diabetic. I think his pain is related to the severe stenosis that he had in the past. He actually did extremely well considering his conditions and pathology of the lumbar spine. He probably would not be a great surgical candidate but at least that should be entertained if a decompression laminectomy could be performed if the injections were not helpful. I do recommend a repeat injection on the left L3 and L4 positions for severe multifactorial stenosis and radicular leg pain and this was improved to a great degree for close to 9 months  with prior injection.    Meds & Orders: No orders of the defined types were placed in this encounter.  No orders of the defined types were placed in this encounter.   Follow-up: Return for left L3 and L4 transforaminal epidural..   Procedures: No procedures performed  No notes on file   Clinical History: MRI LUMBAR SPINE WITHOUT CONTRAST  TECHNIQUE: Multiplanar,  multisequence MR imaging of the lumbar spine was performed. No intravenous contrast was administered.  COMPARISON: None.  FINDINGS: Lumbar segmentation appears to be normal and will be designated as such for this report. Moderate dextro convex lumbar scoliosis. Grade 1 anterolisthesis at L4-L5. Mild retrolisthesis at L5-S1. No marrow edema or evidence of acute osseous abnormality.  Visualized lower thoracic spinal cord is normal with conus medularis at T12.  Negative visualized abdominal viscera; partially visible bilateral renal cysts which appear benign. Ectasia of the abdominal aorta, with the infrarenal segment up to 26 mm diameter. Ectatic common iliac arteries. Negative visualized posterior paraspinal soft tissues.  T11-T12: Negative.  T12-L1: Negative.  L1-L2: Disc desiccation and circumferential disc bulge with mild facet hypertrophy. Mild bilateral L1 foraminal stenosis. Mild right lateral recess stenosis related to superimposed broad-based right paracentral disc protrusion best seen on series 3, image 8.  L2-L3: Circumferential disc bulge. Mild facet hypertrophy. No significant stenosis.  L3-L4: Severe disc space loss with bulky circumferential disc osteophyte complex eccentric to the left. Superimposed moderate to severe facet hypertrophy and epidural lipomatosis. Severe spinal and bilateral lateral recess stenosis. Severe left and mild right L3 foraminal stenosis.  L4-L5: Anterolisthesis. Bulky circumferential disc bulge slightly eccentric to the right. Severe facet hypertrophy greater on the right. Trace facet joint fluid. Epidural lipomatosis. Severe spinal and bilateral lateral recess stenosis. Moderate to severe bilateral L4 foraminal stenosis.  L5-S1: Right eccentric circumferential disc osteophyte complex. Moderate facet hypertrophy. Trace facet joint fluid. Epidural lipomatosis. Moderate spinal stenosis. Moderate right lateral recess stenosis.  Moderate left and moderate to severe right bilateral L5 foraminal stenosis.  IMPRESSION: 1. Ectatic abdominal aorta at risk for aneurysm development. Recommend followup by ultrasound in 5 years. This recommendation follows ACR consensus guidelines: White Paper of the ACR Incidental Findings Committee II on Vascular Findings. J Am Coll Radiol 2013; 10:789-794. 2. Dextro convex lumbar scoliosis with moderate to severe disc, endplate, and posterior element degeneration from L3-L4 to L5-S1. Subsequent severe spinal and lateral recess stenosis at L3-L4 and L4-L5, moderate spinal and right lateral recess stenosis at L5-S1, and moderate to severe foraminal stenosis throughout. 3. L1-L2 disc degeneration with mild right lateral recess stenosis.   Electronically Signed By: Odessa FlemingH Hall M.D. On: 10/19/2015 15:38  He reports that he quit smoking about a year ago. He has never used smokeless tobacco. No results for input(s): HGBA1C, LABURIC in the last 8760 hours.  Objective:  VS:  HT:    WT:   BMI:     BP:109/63  HR:65bpm  TEMP: ( )  RESP:  Physical Exam  Constitutional: He is oriented to person, place, and time. He appears well-developed and well-nourished. No distress.  HENT:  Head: Normocephalic and atraumatic.  Nose: Nose normal.  Mouth/Throat: Oropharynx is clear and moist.  Eyes: Pupils are equal, round, and reactive to light. Conjunctivae are normal.  Neck: Normal range of motion. Neck supple.  Cardiovascular: Regular rhythm and intact distal pulses.   Pulmonary/Chest: Effort normal. No respiratory distress.  Abdominal: Soft. He exhibits no distension.  Musculoskeletal: He exhibits no deformity.  Patient ambulates without aid  with a shuffling type gait with some weakness with dorsiflexion. He has no real swelling in the lower extremities. He has good strength with knee extension and knee flexion and hip flexion bilaterally. No pain with hip rotation. He has a negative slump test  bilaterally. Brief evaluation of the hand shows ulnar deviation of his MCP joints as well as some atrophy of the FDI muscles.  Neurological: He is alert and oriented to person, place, and time. He exhibits normal muscle tone.  Skin: Skin is warm and dry. No rash noted. No erythema.  Psychiatric: He has a normal mood and affect. His behavior is normal.  Nursing note and vitals reviewed.   Ortho Exam Imaging: No results found.  Past Medical/Family/Surgical/Social History: Medications & Allergies reviewed per EMR There are no active problems to display for this patient.  Past Medical History:  Diagnosis Date  . Diabetes mellitus without complication (HCC)   . Neuropathy    Family History  Problem Relation Age of Onset  . Heart Problems Father   . Heart attack Sister    Past Surgical History:  Procedure Laterality Date  . TOTAL KNEE ARTHROPLASTY     Social History   Occupational History  . Not on file.   Social History Main Topics  . Smoking status: Former Smoker    Quit date: 03/04/2016  . Smokeless tobacco: Never Used  . Alcohol use No  . Drug use: Unknown  . Sexual activity: Not on file

## 2017-02-15 NOTE — Progress Notes (Deleted)
Left thigh and knee pain. Constant pain. Worse with laying down. Ok with moving around.. Numbness and tingling.  Denies any back pain.

## 2017-02-16 ENCOUNTER — Encounter (INDEPENDENT_AMBULATORY_CARE_PROVIDER_SITE_OTHER): Payer: Self-pay | Admitting: Physical Medicine and Rehabilitation

## 2017-02-17 NOTE — Telephone Encounter (Signed)
Auto approved on [employer] website for code [id number]. [id number]. Called pt on home and cell # and got no answer and no vm set up so unable to leave message.

## 2017-02-17 NOTE — Telephone Encounter (Signed)
Scheduled for 9/13 at 230.

## 2017-03-01 ENCOUNTER — Ambulatory Visit: Payer: Medicare HMO | Admitting: Cardiology

## 2017-03-01 NOTE — Progress Notes (Deleted)
Clinical Summary Tristan Brooks is a 76 y.o.male    1. [facility] LVEF 50-55%, inferolateral hypokinesis. Moderate AS, moderate AI. From notes LVOT not accurately measured, the reported AVA 0.49 based on AVA vmax, no AVA VTI reported. . There is no AV mean gradient reported. Peak velocity 3.3 m/s. AI PHT 440 ms. LVOT diameter 1.54 from recent study, the LVOT used in the 2013 study was 2.2 - echo 2013 mod AS: AVA 1.1, mean grade 20.  - repeat echo 09/2016 mean grad 30, AVA VTI 0.83, VTI dimensionless index 0.24. Overall moderate to severe AS.  - DOE at 1 block, 1 flight stairs overall stable. Somewhat difficult of a historian to precisely describe his exercise tolerance.   2. HTN - compliant with meds   3. DM2 - compliant with meds. No recent HgbA1c in our system.    4. Hyperlipidemia - compliant with statin.  Past Medical History:  Diagnosis Date  . Diabetes mellitus without complication (HCC)   . Neuropathy      No Known Allergies   Current Outpatient Prescriptions  Medication Sig Dispense Refill  . aspirin 325 MG tablet Take by mouth.    Marland Kitchen. atorvastatin (LIPITOR) 10 MG tablet Take 10 mg by mouth daily.    . canagliflozin (INVOKANA) 300 MG TABS tablet Take 300 mg by mouth daily before breakfast.    . gabapentin (NEURONTIN) 300 MG capsule Take 300 mg by mouth at bedtime.    . meloxicam (MOBIC) 7.5 MG tablet Take 7.5 mg by mouth 2 (two) times daily.    . metFORMIN (GLUCOPHAGE) 1000 MG tablet Take 1,000 mg by mouth 2 (two) times daily with a meal.    . mirtazapine (REMERON) 15 MG tablet Take 15 mg by mouth at bedtime.    . Omega-3 Fatty Acids (FISH OIL) 1360 MG CAPS Take 1 capsule by mouth daily.    Marland Kitchen. omeprazole (PRILOSEC) 20 MG capsule Take 20 mg by mouth daily.    . pioglitazone (ACTOS) 30 MG tablet Take 30 mg by mouth daily.     No current facility-administered medications for this visit.      Past Surgical  History:  Procedure Laterality Date  . TOTAL KNEE ARTHROPLASTY       No Known Allergies    Family History  Problem Relation Age of Onset  . Heart Problems Father   . Heart attack Sister      Social History Tristan Brooks reports that he quit smoking about a year ago. He has never used smokeless tobacco. Tristan Brooks reports that he does not drink alcohol.   Review of Systems CONSTITUTIONAL: No weight loss, fever, chills, weakness or fatigue.  HEENT: Eyes: No visual loss, blurred vision, double vision or yellow sclerae.No hearing loss, sneezing, congestion, runny nose or sore throat.  SKIN: No rash or itching.  CARDIOVASCULAR:  RESPIRATORY: No shortness of breath, cough or sputum.  GASTROINTESTINAL: No anorexia, nausea, vomiting or diarrhea. No abdominal pain or blood.  GENITOURINARY: No burning on urination, no polyuria NEUROLOGICAL: No headache, dizziness, syncope, paralysis, ataxia, numbness or tingling in the extremities. No change in bowel or bladder control.  MUSCULOSKELETAL: No muscle, back pain, joint pain or stiffness.  LYMPHATICS: No enlarged nodes. No history of splenectomy.  PSYCHIATRIC: No history of depression or anxiety.  ENDOCRINOLOGIC: No reports of sweating, cold or heat intolerance. No polyuria or polydipsia.  Marland Kitchen.   Physical Examination There were no vitals filed  for this visit. There were no vitals filed for this visit.  Gen: resting comfortably, no acute distress HEENT: no scleral icterus, pupils equal round and reactive, no palptable cervical adenopathy,  CV Resp: Clear to auscultation bilaterally GI: abdomen is soft, non-tender, non-distended, normal bowel sounds, no hepatosplenomegaly MSK: extremities are warm, no edema.  Skin: warm, no rash Neuro:  no focal deficits Psych: appropriate affect   Diagnostic Studies 09/2016 echo Study Conclusions  - Left ventricle: The cavity size was normal. Wall thickness was normal. Systolic function was  at the lower limits of normal. The estimated ejection fraction was in the range of 50% to 55%. Doppler parameters are consistent with abnormal left ventricular relaxation (grade 1 diastolic dysfunction). Doppler parameters are consistent with high ventricular filling pressure. - Regional wall motion abnormality: Mild hypokinesis of the basal-mid inferior and mid inferolateral myocardium. - Aortic valve: Moderately to severely calcified annulus. Moderately thickened, moderately calcified leaflets. There was moderate to severe stenosis. There was mild to moderate regurgitation. Peak velocity (S): 377 cm/s. Mean gradient (S): 30 mm Hg. Valve area (VTI): 0.83 cm^2. Valve area (Vmax): 0.78 cm^2. Valve area (Vmean): 0.8 cm^2. - Aorta: Mild aortic root dilatation. Aortic root dimension: 40 mm (ED). - Mitral valve: Moderately to severely calcified annulus. - Left atrium: The atrium was severely dilated.    Assessment and Plan   1. Aortic stenosis - moderate to severe AS by recent echo. - somewhat difficult of a historian to assess his functional capacity. Appears at this time not to have significant symptoms - we will continue to follow closesly for progression of stenosis and potential onset of symptoms.   2. HTN - at goal, continue current meds  3. DM2 - he is on invokana, metformin, pioglitazone. Request labs from pcp.   4. Hyperlipidemia - request pcp labs. Continue statin. Pending labs consider moderate strength statin given his DM2 history.     F/u 3 months       Antoine Poche, M.D., F.A.C.C.

## 2017-03-02 ENCOUNTER — Ambulatory Visit (INDEPENDENT_AMBULATORY_CARE_PROVIDER_SITE_OTHER): Payer: Self-pay

## 2017-03-02 ENCOUNTER — Ambulatory Visit (INDEPENDENT_AMBULATORY_CARE_PROVIDER_SITE_OTHER): Payer: Medicare HMO | Admitting: Physical Medicine and Rehabilitation

## 2017-03-02 ENCOUNTER — Encounter (INDEPENDENT_AMBULATORY_CARE_PROVIDER_SITE_OTHER): Payer: Self-pay | Admitting: Physical Medicine and Rehabilitation

## 2017-03-02 VITALS — BP 123/65 | HR 72 | Temp 98.0°F

## 2017-03-02 DIAGNOSIS — M5416 Radiculopathy, lumbar region: Secondary | ICD-10-CM

## 2017-03-02 DIAGNOSIS — M48062 Spinal stenosis, lumbar region with neurogenic claudication: Secondary | ICD-10-CM

## 2017-03-02 MED ORDER — LIDOCAINE HCL (PF) 1 % IJ SOLN
2.0000 mL | Freq: Once | INTRAMUSCULAR | Status: AC
  Administered 2017-03-02: 2 mL

## 2017-03-02 MED ORDER — BETAMETHASONE SOD PHOS & ACET 6 (3-3) MG/ML IJ SUSP
12.0000 mg | Freq: Once | INTRAMUSCULAR | Status: AC
  Administered 2017-03-02: 12 mg

## 2017-03-02 NOTE — Patient Instructions (Signed)

## 2017-03-02 NOTE — Progress Notes (Deleted)
Patient is here for planned left L3-4 transforaminal injection.

## 2017-03-06 NOTE — Procedures (Signed)
Tristan Brooks is a 76 year old gentleman with pretty significant peripheral polyneuropathy and lumbar stenosis.The injection  will be diagnostic and hopefully therapeutic. The patient has failed conservative care including time, medications and activity modification. Please see our prior evaluation and management note for further details and justification. He is here for planned left L3 transforaminal epidural steroid injection.  Lumbosacral Transforaminal Epidural Steroid Injection - Sub-Pedicular Approach with Fluoroscopic Guidance  Patient: Tristan Brooks      Date of Birth: 1940/09/23 MRN: [id number] PCP: Kirstie Peri, MD      Visit Date: 03/02/2017   Universal Protocol:    Date/Time: 03/02/2017  Consent Given By: the patient  Position: PRONE  Additional Comments: Vital signs were monitored before and after the procedure. Patient was prepped and draped in the usual sterile fashion. The correct patient, procedure, and site was verified.   Injection Procedure Details:  Procedure Site One Meds Administered:  Meds ordered this encounter  Medications  . lidocaine (PF) (XYLOCAINE) 1 % injection 2 mL  . betamethasone acetate-betamethasone sodium phosphate (CELESTONE) injection 12 mg    Laterality: Left  Location/Site:  L3-L4  Needle size: 22 G  Needle type: Spinal  Needle Placement: Transforaminal  Findings:  -Contrast Used: 1 mL iohexol 180 mg iodine/mL   -Comments: Excellent flow of contrast along the nerve and into the epidural space.  Procedure Details: After squaring off the end-plates to get a true AP view, the C-arm was positioned so that an oblique view of the foramen as noted above was visualized. The target area is just inferior to the "nose of the scotty dog" or sub pedicular. The soft tissues overlying this structure were infiltrated with 2-3 ml. of 1% Lidocaine without Epinephrine.  The spinal needle was inserted toward the target using a "trajectory" view  along the fluoroscope beam.  Under AP and lateral visualization, the needle was advanced so it did not puncture dura and was located close the 6 O'Clock position of the pedical in AP tracterory. Biplanar projections were used to confirm position. Aspiration was confirmed to be negative for CSF and/or blood. A 1-2 ml. volume of Isovue-250 was injected and flow of contrast was noted at each level. Radiographs were obtained for documentation purposes.   After attaining the desired flow of contrast documented above, a 0.5 to 1.0 ml test dose of 0.25% Marcaine was injected into each respective transforaminal space.  The patient was observed for 90 seconds post injection.  After no sensory deficits were reported, and normal lower extremity motor function was noted,   the above injectate was administered so that equal amounts of the injectate were placed at each foramen (level) into the transforaminal epidural space.   Additional Comments:  The patient tolerated the procedure well Dressing: Band-Aid    Post-procedure details: Patient was observed during the procedure. Post-procedure instructions were reviewed.  Patient left the clinic in stable condition.

## 2017-03-07 ENCOUNTER — Ambulatory Visit: Payer: Medicare HMO | Admitting: Cardiology

## 2017-05-01 ENCOUNTER — Ambulatory Visit (INDEPENDENT_AMBULATORY_CARE_PROVIDER_SITE_OTHER): Payer: Medicare HMO | Admitting: Cardiology

## 2017-05-01 ENCOUNTER — Encounter: Payer: Self-pay | Admitting: *Deleted

## 2017-05-01 ENCOUNTER — Encounter: Payer: Self-pay | Admitting: Cardiology

## 2017-05-01 ENCOUNTER — Other Ambulatory Visit: Payer: Self-pay

## 2017-05-01 VITALS — BP 144/70 | HR 60 | Ht 73.0 in | Wt 196.0 lb

## 2017-05-01 DIAGNOSIS — E782 Mixed hyperlipidemia: Secondary | ICD-10-CM

## 2017-05-01 DIAGNOSIS — I10 Essential (primary) hypertension: Secondary | ICD-10-CM

## 2017-05-01 DIAGNOSIS — I350 Nonrheumatic aortic (valve) stenosis: Secondary | ICD-10-CM

## 2017-05-01 NOTE — Progress Notes (Signed)
Clinical Summary Tristan Brooks is a 76 y.o.male seen today for follow up of the following medical problems.   1. A[facility] stenosis - [facility]2016 LVEF 50-55%, inferolateral hypokinesis. Moderate AS, moderate AI. From notes LVOT not accurately measured, the reported AVA 0.49 based on AVA vmax, no AVA VTI reported. . There is no AV mean gradient reported. Peak velocity 3.3 m/s. AI PHT 440 ms. LVOT diameter 1.54 from recent study, the LVOT used in the 2013 study was 2.2 - echo 2013 mod AS: AVA 1.1, mean grade 20.  - repeat echo 09/2016 mean grad 30, AVA VTI 0.83, VTI dimensionless index 0.24. Overall moderate to severe AS.  - DOE at 1 block, 1 flight stairs overall stable. Somewhat difficult of a historian to precisely describe his exercise tolerance. He he is also limited by back pain.   - no recent SOB/DOE. Sedentary lifestyle, highest level of activity is mowing his grass. Can use a push mower up 1 hour.  - walks up a flight of stairs without stopping, though some SOB at the top which is stable - no chest pain, no syncope. Can have some orthostatic dizziness.    2. HTN - orthostatic symptoms at times - currently off bp meds  3. DM2 - compliant with meds.followed by pcp   4. Hyperlipidemia -he is compliant with his statin     Past Medical History:  Diagnosis Date  . Diabetes mellitus without complication (HCC)   . Neuropathy      No Known Allergies   Current Outpatient Medications  Medication Sig Dispense Refill  . aspirin 325 MG tablet Take by mouth.    Marland Kitchen. atorvastatin (LIPITOR) 10 MG tablet Take 10 mg by mouth daily.    . canagliflozin (INVOKANA) 300 MG TABS tablet Take 300 mg by mouth daily before breakfast.    . gabapentin (NEURONTIN) 300 MG capsule Take 300 mg by mouth at bedtime.    . meloxicam (MOBIC) 7.5 MG tablet Take 7.5 mg by mouth 2 (two) times daily.    . metFORMIN (GLUCOPHAGE) 1000 MG tablet Take 1,000 mg by mouth 2 (two)  times daily with a meal.    . mirtazapine (REMERON) 15 MG tablet Take 15 mg by mouth at bedtime.    . Omega-3 Fatty Acids (FISH OIL) 1360 MG CAPS Take 1 capsule by mouth daily.    Marland Kitchen. omeprazole (PRILOSEC) 20 MG capsule Take 20 mg by mouth daily.    . pioglitazone (ACTOS) 30 MG tablet Take 30 mg by mouth daily.     No current facility-administered medications for this visit.      Past Surgical History:  Procedure Laterality Date  . TOTAL KNEE ARTHROPLASTY       No Known Allergies    Family History  Problem Relation Age of Onset  . Heart Problems Father   . Heart attack Sister      Social History Tristan Brooks reports that he quit smoking about 13 months ago. he has never used smokeless tobacco. Tristan Brooks reports that he does not drink alcohol.   Review of Systems CONSTITUTIONAL: No weight loss, fever, chills, weakness or fatigue.  HEENT: Eyes: No visual loss, blurred vision, double vision or yellow sclerae.No hearing loss, sneezing, congestion, runny nose or sore throat.  SKIN: No rash or itching.  CARDIOVASCULAR: per hpi RESPIRATORY: No shortness of breath, cough or sputum.  GASTROINTESTINAL: No anorexia, nausea, vomiting or diarrhea. No abdominal pain or blood.  GENITOURINARY:  No burning on urination, no polyuria NEUROLOGICAL: No headache, dizziness, syncope, paralysis, ataxia, numbness or tingling in the extremities. No change in bowel or bladder control.  MUSCULOSKELETAL: No muscle, back pain, joint pain or stiffness.  LYMPHATICS: No enlarged nodes. No history of splenectomy.  PSYCHIATRIC: No history of depression or anxiety.  ENDOCRINOLOGIC: No reports of sweating, cold or heat intolerance. No polyuria or polydipsia.  Marland Kitchen.   Physical Examination Vitals:   05/01/17 1041  BP: (!) 144/70  Pulse: 60  SpO2: 96%   Vitals:   05/01/17 1041  Weight: 196 lb (88.9 kg)  Height: 6\' 1"  (1.854 m)    Gen: resting comfortably, no acute distress HEENT: no scleral  icterus, pupils equal round and reactive, no palptable cervical adenopathy,  CV: RRR, 3/6 systolic murmur rusb, no jvd Resp: Clear to auscultation bilaterally GI: abdomen is soft, non-tender, non-distended, normal bowel sounds, no hepatosplenomegaly MSK: extremities are warm, no edema.  Skin: warm, no rash Neuro:  no focal deficits Psych: appropriate affect   Diagnostic Studies 09/2016 echo Study Conclusions  - Left ventricle: The cavity size was normal. Wall thickness was normal. Systolic function was at the lower limits of normal. The estimated ejection fraction was in the range of 50% to 55%. Doppler parameters are consistent with abnormal left ventricular relaxation (grade 1 diastolic dysfunction). Doppler parameters are consistent with high ventricular filling pressure. - Regional wall motion abnormality: Mild hypokinesis of the basal-mid inferior and mid inferolateral myocardium. - Aortic valve: Moderately to severely calcified annulus. Moderately thickened, moderately calcified leaflets. There was moderate to severe stenosis. There was mild to moderate regurgitation. Peak velocity (S): 377 cm/s. Mean gradient (S): 30 mm Hg. Valve area (VTI): 0.83 cm^2. Valve area (Vmax): 0.78 cm^2. Valve area (Vmean): 0.8 cm^2. - Aorta: Mild aortic root dilatation. Aortic root dimension: 40 mm (ED). - Mitral valve: Moderately to severely calcified annulus. - Left atrium: The atrium was severely dilated.    Assessment and Plan  1. Aortic stenosis - moderate to severe AS by recent echo. -he remains asymptomatic based on provided history. Continue to monitor closely. Repeat echo next visit    2. HTN - reasonable bp's, he is off meds currenrlty, prior orthostatic symptoms.    3. Hyperlipidemia -he will continue statin     Antoine PocheJonathan F. Branch, M.D.

## 2017-05-01 NOTE — Patient Instructions (Signed)
Your physician wants you to follow-up in: 3 MONTHS WITH DR BRANCH  Your physician recommends that you continue on your current medications as directed. Please refer to the Current Medication list given to you today.  Thank you for choosing [facility]!!   

## 2017-05-04 ENCOUNTER — Encounter: Payer: Self-pay | Admitting: Cardiology

## 2017-07-28 ENCOUNTER — Telehealth: Payer: Self-pay | Admitting: Cardiology

## 2017-07-28 ENCOUNTER — Encounter: Payer: Self-pay | Admitting: Cardiology

## 2017-07-28 ENCOUNTER — Ambulatory Visit (INDEPENDENT_AMBULATORY_CARE_PROVIDER_SITE_OTHER): Payer: Medicare PPO | Admitting: Cardiology

## 2017-07-28 VITALS — BP 116/68 | HR 75 | Ht 73.0 in | Wt 193.2 lb

## 2017-07-28 DIAGNOSIS — I10 Essential (primary) hypertension: Secondary | ICD-10-CM | POA: Diagnosis not present

## 2017-07-28 DIAGNOSIS — E782 Mixed hyperlipidemia: Secondary | ICD-10-CM

## 2017-07-28 DIAGNOSIS — I350 Nonrheumatic aortic (valve) stenosis: Secondary | ICD-10-CM

## 2017-07-28 NOTE — Telephone Encounter (Signed)
Pre-cert Verification for the following procedure   °Echo scheduled for 08-10-17  °

## 2017-07-28 NOTE — Progress Notes (Signed)
Clinical Summary [name] is a 77 y.o.male  seen today for follow up of the following medical problems.   1. [facility] LVEF 50-55%, inferolateral hypokinesis. Moderate AS, moderate AI. From notes LVOT not accurately measured, the reported AVA 0.49 based on AVA vmax, no AVA VTI reported. . There is no AV mean gradient reported. Peak velocity 3.3 m/s. AI PHT 440 ms. LVOT diameter 1.54 from recent study, the LVOT used in the 2013 study was 2.2 - echo 2013 mod AS: AVA 1.1, mean grade 20.  - repeat echo 09/2016 mean grad 30, AVA VTI 0.83, VTI dimensionless index 0.24. Overall moderate to severe AS. - walks continously 30 minutes around neihborhood 3 times a week. - no chest pain. No presyncope or syncope   2. HTN - orthostatic symptoms at times - he has recently been off bp meds  3. DM2 - compliant with meds.followed by pcp   4. Hyperlipidemia -remains compliant with statin   Past Medical History:  Diagnosis Date  . Diabetes mellitus without complication (HCC)   . Neuropathy      No Known Allergies   Current Outpatient Medications  Medication Sig Dispense Refill  . aspirin 325 MG tablet Take by mouth.    Marland Kitchen. atorvastatin (LIPITOR) 10 MG tablet Take 10 mg by mouth daily.    . canagliflozin (INVOKANA) 300 MG TABS tablet Take 300 mg by mouth daily before breakfast.    . gabapentin (NEURONTIN) 300 MG capsule Take 300 mg by mouth at bedtime.    . meloxicam (MOBIC) 7.5 MG tablet Take 7.5 mg by mouth 2 (two) times daily.    . metFORMIN (GLUCOPHAGE) 1000 MG tablet Take 1,000 mg by mouth 2 (two) times daily with a meal.    . mirtazapine (REMERON) 15 MG tablet Take 15 mg by mouth at bedtime.    . Omega-3 Fatty Acids (FISH OIL) 1360 MG CAPS Take 1 capsule by mouth daily.    Marland Kitchen. omeprazole (PRILOSEC) 20 MG capsule Take 20 mg by mouth daily.    . pioglitazone (ACTOS) 30 MG tablet Take 30 mg by mouth daily.     No current  facility-administered medications for this visit.      Past Surgical History:  Procedure Laterality Date  . TOTAL KNEE ARTHROPLASTY       No Known Allergies    Family History  Problem Relation Age of Onset  . Heart Problems Father   . Heart attack Sister      Social History [name] reports that he has been smoking.  He has been smoking about 0.10 packs per day. he has never used smokeless tobacco. [name] reports that he does not drink alcohol.   Review of Systems CONSTITUTIONAL: No weight loss, fever, chills, weakness or fatigue.  HEENT: Eyes: No visual loss, blurred vision, double vision or yellow sclerae.No hearing loss, sneezing, congestion, runny nose or sore throat.  SKIN: No rash or itching.  CARDIOVASCULAR: per hpi RESPIRATORY: No shortness of breath, cough or sputum.  GASTROINTESTINAL: No anorexia, nausea, vomiting or diarrhea. No abdominal pain or blood.  GENITOURINARY: No burning on urination, no polyuria NEUROLOGICAL: No headache, dizziness, syncope, paralysis, ataxia, numbness or tingling in the extremities. No change in bowel or bladder control.  MUSCULOSKELETAL: No muscle, back pain, joint pain or stiffness.  LYMPHATICS: No enlarged nodes. No history of splenectomy.  PSYCHIATRIC: No history of depression or anxiety.  ENDOCRINOLOGIC: No reports of sweating,  cold or heat intolerance. No polyuria or polydipsia.  Marland Kitchen   Physical Examination Vitals:   07/28/17 1458  BP: 116/68  Pulse: 75  SpO2: 96%   Vitals:   07/28/17 1458  Weight: 193 lb 3.2 oz (87.6 kg)  Height: 6\' 1"  (1.854 m)    Gen: resting comfortably, no acute distress HEENT: no scleral icterus, pupils equal round and reactive, no palptable cervical adenopathy,  CV: RRR, 3/6 systolic murmur rusb, no jvd Resp: Clear to auscultation bilaterally GI: abdomen is soft, non-tender, non-distended, normal bowel sounds, no hepatosplenomegaly MSK: extremities are warm, no edema.  Skin: warm,  no rash Neuro:  no focal deficits Psych: appropriate affect   Diagnostic Studies 09/2016 echo Study Conclusions  - Left ventricle: The cavity size was normal. Wall thickness was normal. Systolic function was at the lower limits of normal. The estimated ejection fraction was in the range of 50% to 55%. Doppler parameters are consistent with abnormal left ventricular relaxation (grade 1 diastolic dysfunction). Doppler parameters are consistent with high ventricular filling pressure. - Regional wall motion abnormality: Mild hypokinesis of the basal-mid inferior and mid inferolateral myocardium. - Aortic valve: Moderately to severely calcified annulus. Moderately thickened, moderately calcified leaflets. There was moderate to severe stenosis. There was mild to moderate regurgitation. Peak velocity (S): 377 cm/s. Mean gradient (S): 30 mm Hg. Valve area (VTI): 0.83 cm^2. Valve area (Vmax): 0.78 cm^2. Valve area (Vmean): 0.8 cm^2. - Aorta: Mild aortic root dilatation. Aortic root dimension: 40 mm (ED). - Mitral valve: Moderately to severely calcified annulus. - Left atrium: The atrium was severely dilated.    Assessment and Plan  1. Aortic stenosis - moderate to severe AS by recent echo. - he is a difficult historian, but appears to remain asymptomatic. - we will repeat echo   2. HTN - bp at goal, no longer requiring meds. Prior orthostatic symptoms have resolved   3. Hyperlipidemia - continue statin, request labs from pcp   F/u 3 months      Antoine Poche, M.D.

## 2017-07-28 NOTE — Patient Instructions (Addendum)
Medication Instructions:   Your physician recommends that you continue on your current medications as directed. Please refer to the Current Medication list given to you today.  Labwork:  NONE  Testing/Procedures: Your physician has requested that you have an echocardiogram. Echocardiography is a painless test that uses sound waves to create images of your heart. It provides your doctor with information about the size and shape of your heart and how well your heart's chambers and valves are working. This procedure takes approximately one hour. There are no restrictions for this procedure.  Follow-Up:  Your physician recommends that you schedule a follow-up appointment in: 3 months.  Any Other Special Instructions Will Be Listed Below (If Applicable).  If you need a refill on your cardiac medications before your next appointment, please call your pharmacy. 

## 2017-08-02 ENCOUNTER — Encounter: Payer: Self-pay | Admitting: Cardiology

## 2017-08-10 ENCOUNTER — Other Ambulatory Visit: Payer: Self-pay

## 2017-08-10 ENCOUNTER — Ambulatory Visit (INDEPENDENT_AMBULATORY_CARE_PROVIDER_SITE_OTHER): Payer: Medicare PPO

## 2017-08-10 DIAGNOSIS — I350 Nonrheumatic aortic (valve) stenosis: Secondary | ICD-10-CM | POA: Diagnosis not present

## 2017-08-10 LAB — ECHOCARDIOGRAM COMPLETE
AOPV: 0.32 m/s
AV Area VTI: 1.1 cm2
AV Area mean vel: 1.09 cm2
AV VEL mean LVOT/AV: 0.31
AV area mean vel ind: 0.51 cm2/m2
AV peak Index: 0.52
AV pk vel: 323 cm/s
AV vel: 1.3
AVAREAVTIIND: 0.61 cm2/m2
AVG: 22 mmHg
AVPG: 42 mmHg
DOP CAL AO MEAN VELOCITY: 214 cm/s
E/e' ratio: 23.71
EWDT: 240 ms
FS: 37 % (ref 28–44)
IV/PV OW: 1.37
LA diam end sys: 46 mm
LA vol A4C: 56.4 ml
LA vol index: 27 mL/m2
LA vol: 57.6 mL
LADIAMINDEX: 2.16 cm/m2
LASIZE: 46 mm
LV PW d: 7.35 mm — AB (ref 0.6–1.1)
LV dias vol: 110 mL (ref 62–150)
LV sys vol: 44 mL (ref 21–61)
LVDIAVOLIN: 52 mL/m2
LVEEAVG: 23.71
LVEEMED: 23.71
LVOT VTI: 23 cm
LVOT area: 3.46 cm2
LVOT diameter: 21 mm
LVOT peak VTI: 0.38 cm
LVOT peak vel: 103 cm/s
LVOTSV: 80 mL
LVSYSVOLIN: 20 mL/m2
MV Dec: 240
MV Peak grad: 3 mmHg
MV pk A vel: 158 m/s
MV pk E vel: 82.5 m/s
P 1/2 time: 610 ms
RV LATERAL S' VELOCITY: 10.9 cm/s
Reg peak vel: 125 cm/s
Simpson's disk: 61
Stroke v: 67 ml
TAPSE: 22.9 mm
TDI e' medial: 3.48
TR max vel: 125 cm/s
VTI: 61.1 cm
Valve area index: 0.61
Valve area: 1.3 cm2

## 2017-08-16 ENCOUNTER — Telehealth: Payer: Self-pay | Admitting: *Deleted

## 2017-08-16 NOTE — Telephone Encounter (Signed)
Pt aware and voiced understanding - routed to pcp  

## 2017-08-16 NOTE — Telephone Encounter (Signed)
-----   Message from Antoine PocheJonathan F Branch, MD sent at 08/15/2017 12:53 PM EST ----- Aortic stenosis (thickened heart valve) remains in the moderate range, continue to monitor at this time  Dominga FerryJ Branch MD

## 2017-11-14 ENCOUNTER — Ambulatory Visit: Payer: Medicare PPO | Admitting: Cardiology

## 2017-11-14 NOTE — Progress Notes (Deleted)
Clinical Summary Tristan Brooks is a 77 y.o.male seen today for follow up of the following medical problems.  1. Aortic stenosis - echo at [facility] 07/2016 LVEF 50-55%, inferolateral hypokinesis. Moderate AS, moderate AI. From notes LVOT not accurately measured, the reported AVA 0.49 based on AVA vmax, no AVA VTI reported. . There is no AV mean gradient reported. Peak velocity 3.3 m/s. AI PHT 440 ms. LVOT diameter 1.54 from recent study, the LVOT used in the 2013 study was 2.2 - echo 2013 mod AS: AVA 1.1, mean grade 20.  - repeat echo 09/2016 mean grad 30, AVA VTI 0.83, VTI dimensionless index 0.24. Overall moderate to severe AS. - walks continously 30 minutes around [place]borhood 3 times a week. - no chest pain. No presyncope or syncope   2. HTN -orthostatic symptoms at times - he has recently been off bp meds  3. DM2 - compliant with meds.followed by pcp   4. Hyperlipidemia -remains compliant with statin    Past Medical History:  Diagnosis Date  . Diabetes mellitus without complication (HCC)   . Neuropathy      No Known Allergies   Current Outpatient Medications  Medication Sig Dispense Refill  . aspirin 325 MG tablet Take by mouth.    Marland Kitchen atorvastatin (LIPITOR) 10 MG tablet Take 10 mg by mouth daily.    . canagliflozin (INVOKANA) 300 MG TABS tablet Take 300 mg by mouth daily before breakfast.    . gabapentin (NEURONTIN) 300 MG capsule Take 300 mg by mouth at bedtime.    Marland Kitchen loratadine (CLARITIN) 10 MG tablet Take 10 mg by mouth daily.    . meloxicam (MOBIC) 7.5 MG tablet Take 7.5 mg by mouth 2 (two) times daily.    . metFORMIN (GLUCOPHAGE) 1000 MG tablet Take 1,000 mg by mouth 2 (two) times daily with a meal.    . mirtazapine (REMERON) 15 MG tablet Take 15 mg by mouth at bedtime.    . Omega-3 Fatty Acids (FISH OIL) 1360 MG CAPS Take 1 capsule by mouth daily.    Marland Kitchen omeprazole (PRILOSEC) 20 MG capsule Take 20 mg by mouth daily.    . pioglitazone  (ACTOS) 30 MG tablet Take 30 mg by mouth daily.     No current facility-administered medications for this visit.      Past Surgical History:  Procedure Laterality Date  . TOTAL KNEE ARTHROPLASTY       No Known Allergies    Family History  Problem Relation Age of Onset  . Heart Problems Father   . Heart attack Sister      Social History Tristan Brooks reports that he has been smoking.  He has been smoking about 0.10 packs per day. He has never used smokeless tobacco. Tristan Brooks reports that he does not drink alcohol.   Review of Systems CONSTITUTIONAL: No weight loss, fever, chills, weakness or fatigue.  HEENT: Eyes: No visual loss, blurred vision, double vision or yellow sclerae.No hearing loss, sneezing, congestion, runny nose or sore throat.  SKIN: No rash or itching.  CARDIOVASCULAR:  RESPIRATORY: No shortness of breath, cough or sputum.  GASTROINTESTINAL: No anorexia, nausea, vomiting or diarrhea. No abdominal pain or blood.  GENITOURINARY: No burning on urination, no polyuria NEUROLOGICAL: No headache, dizziness, syncope, paralysis, ataxia, numbness or tingling in the extremities. No change in bowel or bladder control.  MUSCULOSKELETAL: No muscle, back pain, joint pain or stiffness.  LYMPHATICS: No enlarged nodes. No history of splenectomy.  PSYCHIATRIC:  No history of depression or anxiety.  ENDOCRINOLOGIC: No reports of sweating, cold or heat intolerance. No polyuria or polydipsia.  Marland Kitchen   Physical Examination There were no vitals filed for this visit. There were no vitals filed for this visit.  Gen: resting comfortably, no acute distress HEENT: no scleral icterus, pupils equal round and reactive, no palptable cervical adenopathy,  CV Resp: Clear to auscultation bilaterally GI: abdomen is soft, non-tender, non-distended, normal bowel sounds, no hepatosplenomegaly MSK: extremities are warm, no edema.  Skin: warm, no rash Neuro:  no focal deficits Psych:  appropriate affect   Diagnostic Studies  09/2016 echo Study Conclusions  - Left ventricle: The cavity size was normal. Wall thickness was normal. Systolic function was at the lower limits of normal. The estimated ejection fraction was in the range of 50% to 55%. Doppler parameters are consistent with abnormal left ventricular relaxation (grade 1 diastolic dysfunction). Doppler parameters are consistent with high ventricular filling pressure. - Regional wall motion abnormality: Mild hypokinesis of the basal-mid inferior and mid inferolateral myocardium. - Aortic valve: Moderately to severely calcified annulus. Moderately thickened, moderately calcified leaflets. There was moderate to severe stenosis. There was mild to moderate regurgitation. Peak velocity (S): 377 cm/s. Mean gradient (S): 30 mm Hg. Valve area (VTI): 0.83 cm^2. Valve area (Vmax): 0.78 cm^2. Valve area (Vmean): 0.8 cm^2. - Aorta: Mild aortic root dilatation. Aortic root dimension: 40 mm (ED). - Mitral valve: Moderately to severely calcified annulus. - Left atrium: The atrium was severely dilated.   07/2017 echo Study Conclusions  - Left ventricle: The cavity size was normal. Wall thickness was   normal. Systolic function was normal. The estimated ejection   fraction was in the range of 55% to 60%. Wall motion was normal;   there were no regional wall motion abnormalities. Doppler   parameters are consistent with abnormal left ventricular   relaxation (grade 1 diastolic dysfunction). Doppler parameters   are consistent with high ventricular filling pressure. - Aortic valve: Moderately to severely calcified annulus.   Trileaflet; moderately calcified leaflets. There was moderate   stenosis. There was moderate regurgitation. Peak velocity (S):   323 cm/s. Mean gradient (S): 22 mm Hg. Valve area (VTI): 1.3   cm^2. Valve area (Vmax): 1.1 cm^2. Valve area (Vmean): 1.09 cm^2. - Mitral valve:  Calcified annulus.   Assessment and Plan  1. Aortic stenosis - moderate to severe AS by recent echo. - he is a difficult historian, but appears to remain asymptomatic. - we will repeat echo   2. HTN - bp at goal, no longer requiring meds. Prior orthostatic symptoms have resolved   3. Hyperlipidemia - continue statin, request labs from pcp   F/u 3 months        Antoine Poche, M.D., F.A.C.C.

## 2017-11-22 ENCOUNTER — Ambulatory Visit (INDEPENDENT_AMBULATORY_CARE_PROVIDER_SITE_OTHER): Payer: Medicare PPO | Admitting: Cardiology

## 2017-11-22 ENCOUNTER — Encounter: Payer: Self-pay | Admitting: Cardiology

## 2017-11-22 ENCOUNTER — Encounter: Payer: Self-pay | Admitting: *Deleted

## 2017-11-22 VITALS — BP 118/67 | HR 70 | Ht 73.0 in | Wt 195.8 lb

## 2017-11-22 DIAGNOSIS — I10 Essential (primary) hypertension: Secondary | ICD-10-CM

## 2017-11-22 DIAGNOSIS — I350 Nonrheumatic aortic (valve) stenosis: Secondary | ICD-10-CM | POA: Diagnosis not present

## 2017-11-22 DIAGNOSIS — E782 Mixed hyperlipidemia: Secondary | ICD-10-CM | POA: Diagnosis not present

## 2017-11-22 MED ORDER — ASPIRIN EC 81 MG PO TBEC: 81.0000 mg | DELAYED_RELEASE_TABLET | Freq: Every day | ORAL | 3 refills | Status: DC

## 2017-11-22 NOTE — Progress Notes (Signed)
Clinical Summary Tristan Brooks is a 77 y.o.male seen today for follow up of the following medical problems.  1. Aortic stenosis - echo at [facility] internal medicine 07/2016 LVEF 50-55%, inferolateral hypokinesis. Moderate AS, moderate AI. From notes LVOT not accurately measured, the reported AVA 0.49 based on AVA vmax, no AVA VTI reported. . There is no AV mean gradient reported. Peak velocity 3.3 m/s. AI PHT 440 ms. LVOT diameter 1.54 from recent study, the LVOT used in the 2013 study was 2.2 - echo 2013 mod AS: AVA 1.1, mean grade 20.   - repeat echo 09/2016 mean grad 30, AVA VTI 0.83, VTI dimensionless index 0.24. Overall moderate to severe AS.  07/2017 echo: LVEF 55-60%, mod AS, mod AI. AS mean grad 22, AVA VTI 1.3, dimensionless index 0.32  - no recent symptoms  2. HTN -orthostatic symptoms at times - he has recently been off bp meds  - no recent orthostatic symptoms.     3. Hyperlipidemia -he is compliant with statin - recent labs with pcp   Past Medical History:  Diagnosis Date  . Diabetes mellitus without complication (HCC)   . Neuropathy      No Known Allergies   Current Outpatient Medications  Medication Sig Dispense Refill  . aspirin 325 MG tablet Take by mouth.    Marland Kitchen atorvastatin (LIPITOR) 10 MG tablet Take 10 mg by mouth daily.    . canagliflozin (INVOKANA) 300 MG TABS tablet Take 300 mg by mouth daily before breakfast.    . gabapentin (NEURONTIN) 300 MG capsule Take 300 mg by mouth at bedtime.    Marland Kitchen loratadine (CLARITIN) 10 MG tablet Take 10 mg by mouth daily.    . meloxicam (MOBIC) 7.5 MG tablet Take 7.5 mg by mouth 2 (two) times daily.    . metFORMIN (GLUCOPHAGE) 1000 MG tablet Take 1,000 mg by mouth 2 (two) times daily with a meal.    . mirtazapine (REMERON) 15 MG tablet Take 15 mg by mouth at bedtime.    . Omega-3 Fatty Acids (FISH OIL) 1360 MG CAPS Take 1 capsule by mouth daily.    Marland Kitchen omeprazole (PRILOSEC) 20 MG capsule Take 20 mg by mouth  daily.    . pioglitazone (ACTOS) 30 MG tablet Take 30 mg by mouth daily.     No current facility-administered medications for this visit.      Past Surgical History:  Procedure Laterality Date  . TOTAL KNEE ARTHROPLASTY       No Known Allergies    Family History  Problem Relation Age of Onset  . Heart Problems Father   . Heart attack Sister      Social History Tristan Brooks reports that he has been smoking.  He has been smoking about 0.10 packs per day. He has never used smokeless tobacco. Tristan Brooks reports that he does not drink alcohol.   Review of Systems CONSTITUTIONAL: No weight loss, fever, chills, weakness or fatigue.  HEENT: Eyes: No visual loss, blurred vision, double vision or yellow sclerae.No hearing loss, sneezing, congestion, runny nose or sore throat.  SKIN: No rash or itching.  CARDIOVASCULAR: per hpi RESPIRATORY: per hpi GASTROINTESTINAL: No anorexia, nausea, vomiting or diarrhea. No abdominal pain or blood.  GENITOURINARY: No burning on urination, no polyuria NEUROLOGICAL: No headache, dizziness, syncope, paralysis, ataxia, numbness or tingling in the extremities. No change in bowel or bladder control.  MUSCULOSKELETAL: No muscle, back pain, joint pain or stiffness.  LYMPHATICS: No enlarged nodes. No history  of splenectomy.  PSYCHIATRIC: No history of depression or anxiety.  ENDOCRINOLOGIC: No reports of sweating, cold or heat intolerance. No polyuria or polydipsia.  Marland Kitchen.   Physical Examination Vitals:   11/22/17 1406  BP: 118/67  Pulse: 70  SpO2: 97%   Vitals:   11/22/17 1406  Weight: 195 lb 12.8 oz (88.8 kg)  Height: 6\' 1"  (1.854 m)    Gen: resting comfortably, no acute distress HEENT: no scleral icterus, pupils equal round and reactive, no palptable cervical adenopathy,  CV: RRR, no m/r/g, n ojvd Resp: Clear to auscultation bilaterally GI: abdomen is soft, non-tender, non-distended, normal bowel sounds, no hepatosplenomegaly MSK:  extremities are warm, no edema.  Skin: warm, no rash Neuro:  no focal deficits Psych: appropriate affect   Diagnostic Studies 09/2016 echo Study Conclusions  - Left ventricle: The cavity size was normal. Wall thickness was normal. Systolic function was at the lower limits of normal. The estimated ejection fraction was in the range of 50% to 55%. Doppler parameters are consistent with abnormal left ventricular relaxation (grade 1 diastolic dysfunction). Doppler parameters are consistent with high ventricular filling pressure. - Regional wall motion abnormality: Mild hypokinesis of the basal-mid inferior and mid inferolateral myocardium. - Aortic valve: Moderately to severely calcified annulus. Moderately thickened, moderately calcified leaflets. There was moderate to severe stenosis. There was mild to moderate regurgitation. Peak velocity (S): 377 cm/s. Mean gradient (S): 30 mm Hg. Valve area (VTI): 0.83 cm^2. Valve area (Vmax): 0.78 cm^2. Valve area (Vmean): 0.8 cm^2. - Aorta: Mild aortic root dilatation. Aortic root dimension: 40 mm (ED). - Mitral valve: Moderately to severely calcified annulus. - Left atrium: The atrium was severely dilated.   07/2017 echo  Study Conclusions  - Left ventricle: The cavity size was normal. Wall thickness was   normal. Systolic function was normal. The estimated ejection   fraction was in the range of 55% to 60%. Wall motion was normal;   there were no regional wall motion abnormalities. Doppler   parameters are consistent with abnormal left ventricular   relaxation (grade 1 diastolic dysfunction). Doppler parameters   are consistent with high ventricular filling pressure. - Aortic valve: Moderately to severely calcified annulus.   Trileaflet; moderately calcified leaflets. There was moderate   stenosis. There was moderate regurgitation. Peak velocity (S):   323 cm/s. Mean gradient (S): 22 mm Hg. Valve area (VTI):  1.3   cm^2. Valve area (Vmax): 1.1 cm^2. Valve area (Vmean): 1.09 cm^2. - Mitral valve: Calcified annulus.   Assessment and Plan  1. Aortic stenosis - moderate by last echo, continue to monitor. No recent symptoms.   2. HTN - bp at goal, no longer requiring meds. - continue to monitor at this time   3. Hyperlipidemia - continue statin, request labs from pcp   F/u 6 months       Antoine PocheJonathan F. Venetia Prewitt, M.D

## 2017-11-22 NOTE — Patient Instructions (Addendum)
Medication Instructions:   Your physician has recommended you make the following change in your medication:   Decrease aspirin to 81 mg by mouth daily.  Continue all other medications the same.  Labwork:  NONE  Testing/Procedures:  NONE  Follow-Up:  Your physician recommends that you schedule a follow-up appointment in: 6 months. You will receive a reminder letter in the mail in about 4 months reminding you to call and schedule your appointment. If you don't receive this letter, please contact our office.   Any Other Special Instructions Will Be Listed Below (If Applicable).  If you need a refill on your cardiac medications before your next appointment, please call your pharmacy. 

## 2017-11-29 ENCOUNTER — Encounter: Payer: Self-pay | Admitting: Cardiology

## 2018-02-07 ENCOUNTER — Encounter (INDEPENDENT_AMBULATORY_CARE_PROVIDER_SITE_OTHER): Payer: Self-pay | Admitting: Orthopaedic Surgery

## 2018-02-07 ENCOUNTER — Ambulatory Visit (INDEPENDENT_AMBULATORY_CARE_PROVIDER_SITE_OTHER): Payer: Medicare PPO | Admitting: Orthopaedic Surgery

## 2018-02-07 VITALS — BP 104/56 | HR 78 | Ht 73.0 in | Wt 195.0 lb

## 2018-02-07 DIAGNOSIS — G8929 Other chronic pain: Secondary | ICD-10-CM | POA: Diagnosis not present

## 2018-02-07 DIAGNOSIS — Z96652 Presence of left artificial knee joint: Secondary | ICD-10-CM

## 2018-02-07 DIAGNOSIS — M25561 Pain in right knee: Secondary | ICD-10-CM | POA: Diagnosis not present

## 2018-02-07 MED ORDER — METHYLPREDNISOLONE ACETATE 40 MG/ML IJ SUSP
80.0000 mg | INTRAMUSCULAR | Status: AC | PRN
  Administered 2018-02-07: 80 mg

## 2018-02-07 MED ORDER — LIDOCAINE HCL 1 % IJ SOLN
2.0000 mL | INTRAMUSCULAR | Status: AC | PRN
  Administered 2018-02-07: 2 mL

## 2018-02-07 MED ORDER — BUPIVACAINE HCL 0.5 % IJ SOLN
2.0000 mL | INTRAMUSCULAR | Status: AC | PRN
  Administered 2018-02-07: 2 mL via INTRA_ARTICULAR

## 2018-02-07 NOTE — Progress Notes (Signed)
Office Visit Note   Patient: Tristan Brooks           Date of Birth: 01/20/1941           MRN: [id number] Visit Date: 02/07/2018              Requested by: Kirstie PeriShah, Ashish, MD [address] [place], [place] [id number] PCP: Kirstie PeriShah, Ashish, MD   Assessment & Plan: Visit Diagnoses:  1. Chronic pain of right knee   2. History of left knee replacement     Plan: End-stage osteoarthritis right knee.  Will inject with cortisone.  Has left total knee replacement that appears to be without complication.  Long discussion about different treatment options with his right knee including possible knee replacement over time  Follow-Up Instructions: Return if symptoms worsen or fail to improve.   Orders:  No orders of the defined types were placed in this encounter.  No orders of the defined types were placed in this encounter.     Procedures: Large Joint Inj: R knee on 02/07/2018 2:33 PM Indications: pain and diagnostic evaluation Details: 25 G 1.5 in needle, anteromedial approach  Arthrogram: No  Medications: 2 mL lidocaine 1 %; 2 mL bupivacaine 0.5 %; 80 mg methylPREDNISolone acetate 40 MG/ML Procedure, treatment alternatives, risks and benefits explained, specific risks discussed. Consent was given by the patient. Immediately prior to procedure a time out was called to verify the correct patient, procedure, equipment, support staff and site/side marked as required. Patient was prepped and draped in the usual sterile fashion.       Clinical Data: No additional findings.   Subjective: Chief Complaint  Patient presents with  . Follow-up    L KNEE SURGERY 2009, R KNEE PAIN NO SURGERY  To [name] visited the office for evaluation of bilateral knee pain.  He is approximately 10 years status post left total knee replacement.  He is a diabetic and has a history of neuropathy pain.  He has had problems with both of his knees.  He is having a bit more trouble on the right than the left.  In 2016  he had x-rays of his right knee revealing end-stage arthritis.  He is having difficulty getting around with achiness soreness and stiffness of both knees.  He thinks his neuropathy is causing quite a bit of his problem.  HPI  Review of Systems   Objective: Vital Signs: BP (!) 104/56 (BP Location: Right Arm, Patient Position: Sitting, Cuff Size: Normal)   Pulse 78   Ht 6\' 1"  (1.854 m)   Wt 195 lb (88.5 kg)   BMI 25.73 kg/m   Physical Exam  Constitutional: He is oriented to person, place, and time. He appears well-developed and well-nourished.  HENT:  Mouth/Throat: Oropharynx is clear and moist.  Eyes: Pupils are equal, round, and reactive to light. EOM are normal.  Pulmonary/Chest: Effort normal.  Neurological: He is alert and oriented to person, place, and time.  Skin: Skin is warm and dry.  Psychiatric: He has a normal mood and affect. His behavior is normal.    Ortho Exam awake alert and oriented x3.  Comfortable sitting.  Accompanied by his wife.  Shuffling gait apparently related to his knees but also possibly he does have burning and tingling in both of his feet.  The left knee replacement appears to be without problem.  There was no knee effusion.  The left knee was not unstable.  He had full extension and flexion over 100  degrees.  No localized areas of tenderness.  Appears to have some weakness of both of his legs.  The right knee was hypertrophic with large osteophytes along the medial lateral compartment.  He lacked full knee extension with positive patellar crepitation flexed about 102 or 3 degrees.  No popliteal pain or calf pain.  Straight leg raise was negative.  Painless range of motion both hips. I did aspirate his right knee prior to cortisone injection removed about 75 cc of clear yellow joint effusion.  Had better flexion after removal of the fluid less discomfort  Specialty Comments:  No specialty comments available.  Imaging: No results found.   PMFS  History: There are no active problems to display for this patient.  Past Medical History:  Diagnosis Date  . Diabetes mellitus without complication (HCC)   . Neuropathy     Family History  Problem Relation Age of Onset  . Heart Problems Father   . Heart attack Sister     Past Surgical History:  Procedure Laterality Date  . TOTAL KNEE ARTHROPLASTY     Social History   Occupational History  . Not on file  Tobacco Use  . Smoking status: Current Some Day Smoker    Packs/day: 0.10  . Smokeless tobacco: Never Used  . Tobacco comment: 4-5 ciggs per day  Substance and Sexual Activity  . Alcohol use: No  . Drug use: Not on file  . Sexual activity: Not on file     Valeria BatmanPeter W Hisae Decoursey, MD   Note - This record has been created using [employer] software.  Chart creation errors have been sought, but may not always  have been located. Such creation errors do not reflect on  the standard of medical care.

## 2018-02-07 NOTE — Progress Notes (Deleted)
   Office Visit Note   Patient: Tristan Brooks           Date of Birth: 10/07/1940           MRN: [id number] Visit Date: 02/07/2018              Requested by: Kirstie PeriShah, Ashish, MD [address] [place], [place] [address] PCP: Kirstie PeriShah, Ashish, MD   Assessment & Plan: Visit Diagnoses: No diagnosis found.  Plan: ***  Follow-Up Instructions: No follow-ups on file.   Orders:  No orders of the defined types were placed in this encounter.  No orders of the defined types were placed in this encounter.     Procedures: No procedures performed   Clinical Data: No additional findings.   Subjective: Chief Complaint  Patient presents with  . Follow-up    L KNEE SURGERY 2009, R KNEE PAIN NO SURGERY    HPI  Review of Systems  Constitutional: Negative for fatigue and fever.  HENT: Negative for ear pain.   Eyes: Negative for pain.  Respiratory: Negative for cough and shortness of breath.   Cardiovascular: Positive for leg swelling.  Gastrointestinal: Negative for constipation and diarrhea.  Genitourinary: Negative for difficulty urinating.  Musculoskeletal: Negative for back pain and neck pain.  Skin: Negative for rash.  Allergic/Immunologic: Negative for food allergies.  Neurological: Positive for weakness and numbness.  Hematological: Does not bruise/bleed easily.  Psychiatric/Behavioral: Positive for sleep disturbance.     Objective: Vital Signs: BP (!) 104/56 (BP Location: Right Arm, Patient Position: Sitting, Cuff Size: Normal)   Pulse 78   Ht 6\' 1"  (1.854 m)   Wt 195 lb (88.5 kg)   BMI 25.73 kg/m   Physical Exam  Ortho Exam  Specialty Comments:  No specialty comments available.  Imaging: No results found.   PMFS History: There are no active problems to display for this patient.  Past Medical History:  Diagnosis Date  . Diabetes mellitus without complication (HCC)   . Neuropathy     Family History  Problem Relation Age of Onset  . Heart Problems Father     . Heart attack Sister     Past Surgical History:  Procedure Laterality Date  . TOTAL KNEE ARTHROPLASTY     Social History   Occupational History  . Not on file  Tobacco Use  . Smoking status: Current Some Day Smoker    Packs/day: 0.10  . Smokeless tobacco: Never Used  . Tobacco comment: 4-5 ciggs per day  Substance and Sexual Activity  . Alcohol use: No  . Drug use: Not on file  . Sexual activity: Not on file

## 2018-05-30 ENCOUNTER — Ambulatory Visit (INDEPENDENT_AMBULATORY_CARE_PROVIDER_SITE_OTHER): Payer: Medicare PPO | Admitting: Orthopaedic Surgery

## 2018-05-30 ENCOUNTER — Ambulatory Visit (INDEPENDENT_AMBULATORY_CARE_PROVIDER_SITE_OTHER): Payer: Medicare PPO | Admitting: Cardiology

## 2018-05-30 ENCOUNTER — Encounter: Payer: Self-pay | Admitting: Cardiology

## 2018-05-30 ENCOUNTER — Telehealth: Payer: Self-pay | Admitting: Cardiology

## 2018-05-30 VITALS — BP 124/60 | HR 64 | Ht 73.0 in | Wt 201.8 lb

## 2018-05-30 DIAGNOSIS — E782 Mixed hyperlipidemia: Secondary | ICD-10-CM

## 2018-05-30 DIAGNOSIS — I350 Nonrheumatic aortic (valve) stenosis: Secondary | ICD-10-CM

## 2018-05-30 NOTE — Addendum Note (Signed)
Addended by: Eustace MooreANDERSON, Yamilett Anastos M on: 05/30/2018 03:05 PM   Modules accepted: Orders

## 2018-05-30 NOTE — Progress Notes (Signed)
Clinical Summary Tristan Brooks is a 77 y.o.male seen today for follow up of the following medical problems.  1. Aortic stenosis - echo 09/2016 mean grad 30, AVA VTI 0.83, VTI dimensionless index 0.24. Overall moderate to severe AS.  07/2017 echo: LVEF 55-60%, mod AS, mod AI. AS mean grad 22, AVA VTI 1.3, dimensionless index 0.32   - no recent chst pain, no SOB or syncope    2. Hyperlipidemia - recent labs with pcp   Past Medical History:  Diagnosis Date  . Diabetes mellitus without complication (HCC)   . Neuropathy      No Known Allergies   Current Outpatient Medications  Medication Sig Dispense Refill  . aspirin EC 81 MG tablet Take 1 tablet (81 mg total) by mouth daily. 90 tablet 3  . atorvastatin (LIPITOR) 10 MG tablet Take 10 mg by mouth daily.    . canagliflozin (INVOKANA) 300 MG TABS tablet Take 300 mg by mouth daily before breakfast.    . gabapentin (NEURONTIN) 300 MG capsule Take 300 mg by mouth at bedtime.    Marland Kitchen. loratadine (CLARITIN) 10 MG tablet Take 10 mg by mouth daily.    . meloxicam (MOBIC) 7.5 MG tablet Take 7.5 mg by mouth 2 (two) times daily.    . metFORMIN (GLUCOPHAGE) 1000 MG tablet Take 1,000 mg by mouth 2 (two) times daily with a meal.    . mirtazapine (REMERON) 15 MG tablet Take 15 mg by mouth at bedtime.    . Omega-3 Fatty Acids (FISH OIL) 1360 MG CAPS Take 1 capsule by mouth daily.    Marland Kitchen. omeprazole (PRILOSEC) 20 MG capsule Take 20 mg by mouth daily.    . pioglitazone (ACTOS) 30 MG tablet Take 30 mg by mouth daily.     No current facility-administered medications for this visit.      Past Surgical History:  Procedure Laterality Date  . TOTAL KNEE ARTHROPLASTY       No Known Allergies    Family History  Problem Relation Age of Onset  . Heart Problems Father   . Heart attack Sister      Social History Tristan Brooks reports that he has been smoking. He has been smoking about 0.10 packs per day. He has never used smokeless  tobacco. Tristan Brooks reports that he does not drink alcohol.   Review of Systems CONSTITUTIONAL: No weight loss, fever, chills, weakness or fatigue.  HEENT: Eyes: No visual loss, blurred vision, double vision or yellow sclerae.No hearing loss, sneezing, congestion, runny nose or sore throat.  SKIN: No rash or itching.  CARDIOVASCULAR: per hpi RESPIRATORY: No shortness of breath, cough or sputum.  GASTROINTESTINAL: No anorexia, nausea, vomiting or diarrhea. No abdominal pain or blood.  GENITOURINARY: No burning on urination, no polyuria NEUROLOGICAL: No headache, dizziness, syncope, paralysis, ataxia, numbness or tingling in the extremities. No change in bowel or bladder control.  MUSCULOSKELETAL: No muscle, back pain, joint pain or stiffness.  LYMPHATICS: No enlarged nodes. No history of splenectomy.  PSYCHIATRIC: No history of depression or anxiety.  ENDOCRINOLOGIC: No reports of sweating, cold or heat intolerance. No polyuria or polydipsia.  Marland Kitchen.   Physical Examination Vitals:   05/30/18 1443  BP: 124/60  Pulse: 64  SpO2: 96%   Vitals:   05/30/18 1443  Weight: 201 lb 12.8 oz (91.5 kg)  Height: 6\' 1"  (1.854 m)    Gen: resting comfortably, no acute distress HEENT: no scleral icterus, pupils equal round and reactive, no palptable  cervical adenopathy,  CV: RRR, 2/6 systolc murmur rusb, no jvd Resp: Clear to auscultation bilaterally GI: abdomen is soft, non-tender, non-distended, normal bowel sounds, no hepatosplenomegaly MSK: extremities are warm, no edema.  Skin: warm, no rash Neuro:  no focal deficits Psych: appropriate affect   Diagnostic Studies 09/2016 echo Study Conclusions  - Left ventricle: The cavity size was normal. Wall thickness was normal. Systolic function was at the lower limits of normal. The estimated ejection fraction was in the range of 50% to 55%. Doppler parameters are consistent with abnormal left ventricular relaxation (grade 1 diastolic  dysfunction). Doppler parameters are consistent with high ventricular filling pressure. - Regional wall motion abnormality: Mild hypokinesis of the basal-mid inferior and mid inferolateral myocardium. - Aortic valve: Moderately to severely calcified annulus. Moderately thickened, moderately calcified leaflets. There was moderate to severe stenosis. There was mild to moderate regurgitation. Peak velocity (S): 377 cm/s. Mean gradient (S): 30 mm Hg. Valve area (VTI): 0.83 cm^2. Valve area (Vmax): 0.78 cm^2. Valve area (Vmean): 0.8 cm^2. - Aorta: Mild aortic root dilatation. Aortic root dimension: 40 mm (ED). - Mitral valve: Moderately to severely calcified annulus. - Left atrium: The atrium was severely dilated.   07/2017 echo  Study Conclusions  - Left ventricle: The cavity size was normal. Wall thickness was normal. Systolic function was normal. The estimated ejection fraction was in the range of 55% to 60%. Wall motion was normal; there were no regional wall motion abnormalities. Doppler parameters are consistent with abnormal left ventricular relaxation (grade 1 diastolic dysfunction). Doppler parameters are consistent with high ventricular filling pressure. - Aortic valve: Moderately to severely calcified annulus. Trileaflet; moderately calcified leaflets. There was moderate stenosis. There was moderate regurgitation. Peak velocity (S): 323 cm/s. Mean gradient (S): 22 mm Hg. Valve area (VTI): 1.3 cm^2. Valve area (Vmax): 1.1 cm^2. Valve area (Vmean): 1.09 cm^2. - Mitral valve: Calcified annulus.    Assessment and Plan  1. Aortic stenosis - moderate AS by echo early this year, repeat echo 07/2018 - remains asymptomatic.   2. Hyperlipidemia - reqeust pcp labs - continue statin. Pending LDL could consider moderate strength statin in setting of DM2 if not a very low LDL already   F/u 6 months. Repeat echo /2020      Antoine Poche, M.D

## 2018-05-30 NOTE — Telephone Encounter (Signed)
°  Precert needed for: Echo    Location: [facility]     Date: Aug 01, 2017

## 2018-05-30 NOTE — Patient Instructions (Signed)
Medication Instructions:   Your physician recommends that you continue on your current medications as directed. Please refer to the Current Medication list given to you today.  Labwork:  NONE  Testing/Procedures: Your physician has requested that you have an echocardiogram in February 2020. Echocardiography is a painless test that uses sound waves to create images of your heart. It provides your doctor with information about the size and shape of your heart and how well your heart's chambers and valves are working. This procedure takes approximately one hour. There are no restrictions for this procedure.  Follow-Up:  Your physician recommends that you schedule a follow-up appointment in: 6 months. You will receive a reminder letter in the mail in about 4 months reminding you to call and schedule your appointment. If you don't receive this letter, please contact our office.  Any Other Special Instructions Will Be Listed Below (If Applicable).  If you need a refill on your cardiac medications before your next appointment, please call your pharmacy.

## 2018-06-27 ENCOUNTER — Ambulatory Visit (INDEPENDENT_AMBULATORY_CARE_PROVIDER_SITE_OTHER): Payer: Medicare PPO | Admitting: Orthopaedic Surgery

## 2018-08-01 ENCOUNTER — Other Ambulatory Visit: Payer: Medicare PPO

## 2018-08-01 ENCOUNTER — Other Ambulatory Visit: Payer: Self-pay | Admitting: Cardiology

## 2018-08-01 DIAGNOSIS — I359 Nonrheumatic aortic valve disorder, unspecified: Secondary | ICD-10-CM

## 2018-08-29 ENCOUNTER — Ambulatory Visit (INDEPENDENT_AMBULATORY_CARE_PROVIDER_SITE_OTHER): Payer: Medicare HMO

## 2018-08-29 ENCOUNTER — Other Ambulatory Visit: Payer: Self-pay

## 2018-08-29 DIAGNOSIS — I359 Nonrheumatic aortic valve disorder, unspecified: Secondary | ICD-10-CM

## 2018-09-03 ENCOUNTER — Telehealth: Payer: Self-pay

## 2018-09-03 NOTE — Telephone Encounter (Signed)
Called pt. No answer, left message for pt to return call.  

## 2018-11-30 ENCOUNTER — Telehealth: Payer: Self-pay | Admitting: Cardiology

## 2018-11-30 NOTE — Telephone Encounter (Signed)
Virtual Visit Pre-Appointment Phone Call  "(Name), I am calling you today to discuss your upcoming appointment. We are currently trying to limit exposure to the virus that causes COVID-19 by seeing patients at home rather than in the office."  "What is the BEST phone number to call the day of the visit?" -  [phone]  1. Do you have or have access to (through a family member/friend) a smartphone with video capability that we can use for your visit?" a. If yes - list this number in appt notes as cell (if different from BEST phone #) and list the appointment type as a VIDEO visit in appointment notes b. If no - list the appointment type as a PHONE visit in appointment notes  2. Confirm consent - "In the setting of the current Covid19 crisis, you are scheduled for a (phone or video) visit with your provider on (date) at (time).  Just as we do with many in-office visits, in order for you to participate in this visit, we must obtain consent.  If you'd like, I can send this to your mychart (if signed up) or email for you to review.  Otherwise, I can obtain your verbal consent now.  All virtual visits are billed to your insurance company just like a normal visit would be.  By agreeing to a virtual visit, we'd like you to understand that the technology does not allow for your provider to perform an examination, and thus may limit your provider's ability to fully assess your condition. If your provider identifies any concerns that need to be evaluated in person, we will make arrangements to do so.  Finally, though the technology is pretty good, we cannot assure that it will always work on either your or our end, and in the setting of a video visit, we may have to convert it to a phone-only visit.  In either situation, we cannot ensure that we have a secure connection.  Are you willing to proceed?" STAFF: Did the patient verbally acknowledge consent to telehealth visit? Document YES/NO here:  YES  3. Advise patient to be prepared - "Two hours prior to your appointment, go ahead and check your blood pressure, pulse, oxygen saturation, and your weight (if you have the equipment to check those) and write them all down. When your visit starts, your provider will ask you for this information. If you have an Apple Watch or Kardia device, please plan to have heart rate information ready on the day of your appointment. Please have a pen and paper handy nearby the day of the visit as well."  4. Give patient instructions for MyChart download to smartphone OR Doximity/Doxy.me as below if video visit (depending on what platform provider is using)  5. Inform patient they will receive a phone call 15 minutes prior to their appointment time (may be from unknown caller ID) so they should be prepared to answer    [name] for a follow-up tele-health visit to limit community exposure during the Covid-19 pandemic. I spoke with the patient via phone to ensure availability of phone/video source, confirm preferred email & phone number, and discuss instructions and expectations.  I reminded Tristan Brooks to be prepared with any vital sign and/or heart rhythm information that could potentially be obtained via home monitoring, at the time of his visit. I reminded Tristan Brooks to expect a phone call prior to his visit.  [name] 11/30/2018 3:47 PM   INSTRUCTIONS FOR DOWNLOADING THE MYCHART APP TO SMARTPHONE  - The patient must first make sure to have activated MyChart and know their login information - If Apple, go to [employer] Store and type in MyChart in the search bar and download the app. If Android, ask patient to go to [employer] Play Store and type in StemMyChart in the search bar and download the app. The app is free but as with any other app downloads, their phone may require them to verify saved payment information or Apple/Android  password.  - The patient will need to then log into the app with their MyChart username and password, and select [place] [facility] as their healthcare provider to link the account. When it is time for your visit, go to the MyChart app, find appointments, and click Begin Video Visit. Be sure to Select Allow for your device to access the Microphone and Camera for your visit. You will then be connected, and your provider will be with you shortly.  **If they have any issues connecting, or need assistance please contact MyChart service desk [phone])**  **If using a computer, in order to ensure the best quality for their visit they will need to use either of the following Internet Browsers: [employer] Edge, or Google Chrome**  IF USING DOXIMITY or DOXY.ME - The patient will receive a link just prior to their visit by text.     FULL LENGTH CONSENT FOR TELE-HEALTH VISIT   I hereby voluntarily request, consent and authorize [facility] and its employed or contracted physicians, physician assistants, nurse practitioners or other licensed health care professionals (the Practitioner), to provide me with telemedicine health care services (the Services") as deemed necessary by the treating Practitioner. I acknowledge and consent to receive the Services by the Practitioner via telemedicine. I understand that the telemedicine visit will involve communicating with the Practitioner through live audiovisual communication technology and the disclosure of certain medical information by electronic transmission. I acknowledge that I have been given the opportunity to request an in-person assessment or other available alternative prior to the telemedicine visit and am voluntarily participating in the telemedicine visit.  I understand that I have the right to withhold or withdraw my consent to the use of telemedicine in the course of my care at any time, without affecting my right to future care or treatment,  and that the Practitioner or I may terminate the telemedicine visit at any time. I understand that I have the right to inspect all information obtained and/or recorded in the course of the telemedicine visit and may receive copies of available information for a reasonable fee.  I understand that some of the potential risks of receiving the Services via telemedicine include:   Delay or interruption in medical evaluation due to technological equipment failure or disruption;  Information transmitted may not be sufficient (e.g. poor resolution of images) to allow for appropriate medical decision making by the Practitioner; and/or   In rare instances, security protocols could fail, causing a breach of personal health information.  Furthermore, I acknowledge that it is my responsibility to provide information about my medical history, conditions and care that is complete and accurate to the best of my ability. I acknowledge that Practitioner's advice, recommendations, and/or decision may be based on factors not within their control, such as incomplete or inaccurate data provided by me or distortions of diagnostic images or specimens that may result from electronic transmissions. I understand that the practice  of medicine is not an Chief Strategy Officer and that Practitioner makes no warranties or guarantees regarding treatment outcomes. I acknowledge that I will receive a copy of this consent concurrently upon execution via email to the email address I last provided but may also request a printed copy by calling the office of [place].    I understand that my insurance will be billed for this visit.   I have read or had this consent read to me.  I understand the contents of this consent, which adequately explains the benefits and risks of the Services being provided via telemedicine.   I have been provided ample opportunity to ask questions regarding this consent and the Services and have had my questions  answered to my satisfaction.  I give my informed consent for the services to be provided through the use of telemedicine in my medical care  By participating in this telemedicine visit I agree to the above.

## 2018-12-06 ENCOUNTER — Telehealth (INDEPENDENT_AMBULATORY_CARE_PROVIDER_SITE_OTHER): Payer: Medicare HMO | Admitting: Cardiology

## 2018-12-06 ENCOUNTER — Encounter: Payer: Self-pay | Admitting: Cardiology

## 2018-12-06 VITALS — Ht 73.0 in | Wt 208.0 lb

## 2018-12-06 DIAGNOSIS — I350 Nonrheumatic aortic (valve) stenosis: Secondary | ICD-10-CM | POA: Diagnosis not present

## 2018-12-06 DIAGNOSIS — E782 Mixed hyperlipidemia: Secondary | ICD-10-CM

## 2018-12-06 NOTE — Patient Instructions (Signed)

## 2018-12-06 NOTE — Progress Notes (Signed)
Virtual Visit via Telephone Note   This visit type was conducted due to national recommendations for restrictions regarding the COVID-19 Pandemic (e.g. social distancing) in an effort to limit this patient's exposure and mitigate transmission in our community.  Due to his co-morbid illnesses, this patient is at least at moderate risk for complications without adequate follow up.  This format is felt to be most appropriate for this patient at this time.  The patient did not have access to video technology/had technical difficulties with video requiring transitioning to audio format only (telephone).  All issues noted in this document were discussed and addressed.  No physical exam could be performed with this format.  Please refer to the patient's chart for his  consent to [facility].   Date:  12/06/2018   ID:  Tristan Brooks, DOB 03/04/1941, MRN [id number]  Patient Location: Home Provider Location: Office  PCP:  Kirstie PeriShah, Ashish, MD  Cardiologist:  Dina RichBranch, Rosevelt Luu, MD  Electrophysiologist:  None   Evaluation Performed:  Follow-Up Visit  Chief Complaint:  6 month follow up  History of Present Illness:    Tristan Brooks is a 78 y.o. male  seen today for follow up of the following medical problems.  1. Aortic stenosis - echo 09/2016 mean grad 30, AVA VTI 0.83, VTI dimensionless index 0.24. Overall moderate to severe AS.  07/2017 echo: LVEF 55-60%, mod AS, mod AI. AS mean grad 22, AVA VTI 1.3, dimensionless index 0.32  08/2018 echo LVEF 55-60%, grade I diastolic dysfunction, moderate AS AVA VTI 1.2 AV mean 20  -no recent chest pain, no SOB or DOE    2. Hyperlipidemia - labs followed by pcp - compliant with statin    The patient does not have symptoms concerning for COVID-19 infection (fever, chills, cough, or new shortness of breath).    Past Medical History:  Diagnosis Date  . Diabetes mellitus without complication (HCC)   . Neuropathy     Past Surgical History:  Procedure Laterality Date  . TOTAL KNEE ARTHROPLASTY       No outpatient medications have been marked as taking for the 12/06/18 encounter (Appointment) with Antoine PocheBranch, Nalia Honeycutt F, MD.     Allergies:   Patient has no known allergies.   Social History   Tobacco Use  . Smoking status: Current Some Day Smoker    Packs/day: 0.10  . Smokeless tobacco: Never Used  . Tobacco comment: 4-5 ciggs per day  Substance Use Topics  . Alcohol use: No  . Drug use: Not on file     Family Hx: The patient's family history includes Heart Problems in his father; Heart attack in his sister.  ROS:   Please see the history of present illness.     All other systems reviewed and are negative.   Prior CV studies:   The following studies were reviewed today:  09/2016 echo Study Conclusions  - Left ventricle: The cavity size was normal. Wall thickness was normal. Systolic function was at the lower limits of normal. The estimated ejection fraction was in the range of 50% to 55%. Doppler parameters are consistent with abnormal left ventricular relaxation (grade 1 diastolic dysfunction). Doppler parameters are consistent with high ventricular filling pressure. - Regional wall motion abnormality: Mild hypokinesis of the basal-mid inferior and mid inferolateral myocardium. - Aortic valve: Moderately to severely calcified annulus. Moderately thickened, moderately calcified leaflets. There was moderate to severe stenosis. There was mild to moderate regurgitation. Peak velocity (S): 377  cm/s. Mean gradient (S): 30 mm Hg. Valve area (VTI): 0.83 cm^2. Valve area (Vmax): 0.78 cm^2. Valve area (Vmean): 0.8 cm^2. - Aorta: Mild aortic root dilatation. Aortic root dimension: 40 mm (ED). - Mitral valve: Moderately to severely calcified annulus. - Left atrium: The atrium was severely dilated.   07/2017 echo  Study Conclusions  - Left ventricle: The cavity  size was normal. Wall thickness was normal. Systolic function was normal. The estimated ejection fraction was in the range of 55% to 60%. Wall motion was normal; there were no regional wall motion abnormalities. Doppler parameters are consistent with abnormal left ventricular relaxation (grade 1 diastolic dysfunction). Doppler parameters are consistent with high ventricular filling pressure. - Aortic valve: Moderately to severely calcified annulus. Trileaflet; moderately calcified leaflets. There was moderate stenosis. There was moderate regurgitation. Peak velocity (S): 323 cm/s. Mean gradient (S): 22 mm Hg. Valve area (VTI): 1.3 cm^2. Valve area (Vmax): 1.1 cm^2. Valve area (Vmean): 1.09 cm^2. - Mitral valve: Calcified annulus.  08/2018 echo IMPRESSIONS    1. The left ventricle has normal systolic function, with an ejection fraction of 55-60%. The cavity size was normal. There is mildly increased left ventricular wall thickness. Left ventricular diastolic Doppler parameters are consistent with impaired  relaxation. Possible basal inferolateral hypokinesis .  2. The right ventricle has normal systolic function. The cavity was normal. There is no increase in right ventricular wall thickness. Right ventricular systolic pressure normal with an estimated pressure of 9.2 mmHg.  3. The aortic valve is functionally bicuspid. Moderate calcification of the aortic valve. Aortic valve regurgitation is mild by color flow Doppler. Moderate stenosis of the aortic valve. LVOT/AV VTI 0.37 and AVA approximately 1.2 cm2. Mild aortic  annular calcification noted.  4. The mitral valve is degenerative. Mild thickening of the mitral valve leaflet. There is moderate mitral annular calcification present.  5. The tricuspid valve is grossly normal.    Labs/Other Tests and Data Reviewed:    EKG:  No ECG reviewed.  Recent Labs: No results found for requested labs within last 8760 hours.    Recent Lipid Panel No results found for: CHOL, TRIG, HDL, CHOLHDL, LDLCALC, LDLDIRECT  Wt Readings from Last 3 Encounters:  05/30/18 201 lb 12.8 oz (91.5 kg)  02/07/18 195 lb (88.5 kg)  11/22/17 195 lb 12.8 oz (88.8 kg)     Objective:    Vital Signs:  There were no vitals taken for this visit.   Today's Vitals   12/06/18 1337  Weight: 208 lb (94.3 kg)  Height: 6\' 1"  (1.854 m)   Body mass index is 27.44 kg/m.  Normal affect. Normal speech pattern and tone. Comfortable, no apparent distress. No audible signs of SOB or wheezing.   ASSESSMENT & PLAN:    1. Aortic stenosis - stable moderate asymptomatic AS, had imaging earlier this year - continue to monitor.   2. Hyperlipidemia - continue statin - request labs from pcp   [id number]  COVID-19 Education: The signs and symptoms of COVID-19 were discussed with the patient and how to seek care for testing (follow up with PCP or arrange E-visit).  The importance of social distancing was discussed today.  Time:   Today, I have spent 13 minutes with the patient with telehealth technology discussing the above problems.     Medication Adjustments/Labs and Tests Ordered: Current medicines are reviewed at length with the patient today.  Concerns regarding medicines are outlined above.   Tests Ordered: No orders of the defined types  were placed in this encounter.   Medication Changes: No orders of the defined types were placed in this encounter.   Follow Up:  In Person in 6 month(s)  Signed, Carlyle Dolly, MD  12/06/2018 1:20 PM    [facility]

## 2018-12-17 DIAGNOSIS — E119 Type 2 diabetes mellitus without complications: Secondary | ICD-10-CM | POA: Insufficient documentation

## 2018-12-17 DIAGNOSIS — E785 Hyperlipidemia, unspecified: Secondary | ICD-10-CM | POA: Insufficient documentation

## 2019-03-11 DIAGNOSIS — N183 Chronic kidney disease, stage 3 unspecified: Secondary | ICD-10-CM | POA: Insufficient documentation

## 2019-07-31 DIAGNOSIS — E119 Type 2 diabetes mellitus without complications: Secondary | ICD-10-CM | POA: Diagnosis not present

## 2019-07-31 DIAGNOSIS — E785 Hyperlipidemia, unspecified: Secondary | ICD-10-CM | POA: Diagnosis not present

## 2019-07-31 DIAGNOSIS — N183 Chronic kidney disease, stage 3 unspecified: Secondary | ICD-10-CM | POA: Diagnosis not present

## 2019-08-28 ENCOUNTER — Encounter: Payer: Self-pay | Admitting: *Deleted

## 2019-08-28 ENCOUNTER — Encounter: Payer: Self-pay | Admitting: Cardiology

## 2019-08-28 ENCOUNTER — Telehealth (INDEPENDENT_AMBULATORY_CARE_PROVIDER_SITE_OTHER): Payer: Medicare HMO | Admitting: Cardiology

## 2019-08-28 ENCOUNTER — Telehealth: Payer: Self-pay | Admitting: Cardiology

## 2019-08-28 VITALS — Ht 73.0 in | Wt 200.0 lb

## 2019-08-28 DIAGNOSIS — I350 Nonrheumatic aortic (valve) stenosis: Secondary | ICD-10-CM

## 2019-08-28 DIAGNOSIS — E782 Mixed hyperlipidemia: Secondary | ICD-10-CM

## 2019-08-28 NOTE — Addendum Note (Signed)
Addended by: Burman Nieves T on: 08/28/2019 01:11 PM   Modules accepted: Orders

## 2019-08-28 NOTE — Progress Notes (Signed)
Virtual Visit via Telephone Note   This visit type was conducted due to national recommendations for restrictions regarding the COVID-19 Pandemic (e.g. social distancing) in an effort to limit this patient's exposure and mitigate transmission in our community.  Due to his co-morbid illnesses, this patient is at least at moderate risk for complications without adequate follow up.  This format is felt to be most appropriate for this patient at this time.  The patient did not have access to video technology/had technical difficulties with video requiring transitioning to audio format only (telephone).  All issues noted in this document were discussed and addressed.  No physical exam could be performed with this format.  Please refer to the patient's chart for his  consent to telehealth for [facility].   The patient was identified using 2 identifiers.  Date:  08/28/2019   ID:  [name], DOB 12/01/1940, MRN [id number]  Patient Location: Home Provider Location: Office  PCP:  Kirstie Peri, MD  Cardiologist:  Dina Rich, MD  Electrophysiologist:  None   Evaluation Performed:  Follow-Up Visit  Chief Complaint:  Follow up   History of Present Illness:    Tristan Brooks is a 79 y.o. male seen today for follow up of the following medical problems.  1. Aortic stenosis - echo 09/2016 mean grad 30, AVA VTI 0.83, VTI dimensionless index 0.24. Overall moderate to severe AS.  07/2017 echo: LVEF 55-60%, mod AS, mod AI. AS mean grad 22, AVA VTI 1.3, dimensionless index 0.32  08/2018 echo LVEF 55-60%, grade I diastolic dysfunction, moderate AS AVA VTI 1.2 AV mean 20  -isolated episode of chest tightnes that resolved. No SOB or DOE. No LE edema.   2. Hyperlipidemia - labs followed by pcp - compliant with statin   Had his first covid shot last week at [facility].   The patient does not have symptoms concerning for COVID-19 infection (fever, chills, cough, or new shortness of  breath).    Past Medical History:  Diagnosis Date  . Diabetes mellitus without complication (HCC)   . Neuropathy    Past Surgical History:  Procedure Laterality Date  . TOTAL KNEE ARTHROPLASTY       Current Meds  Medication Sig  . aspirin EC 81 MG tablet Take 1 tablet (81 mg total) by mouth daily.  Marland Kitchen atorvastatin (LIPITOR) 10 MG tablet Take 10 mg by mouth daily.  Marland Kitchen glimepiride (AMARYL) 2 MG tablet Take 2 mg by mouth daily with breakfast.  . Multiple Vitamins-Minerals (CENTRUM SILVER PO) Take by mouth.  . Omega-3 Fatty Acids (FISH OIL) 1360 MG CAPS Take 1 capsule by mouth daily.  . pantoprazole (PROTONIX) 40 MG tablet Take 40 mg by mouth daily.  . pioglitazone (ACTOS) 30 MG tablet Take 30 mg by mouth daily.  . [DISCONTINUED] canagliflozin (INVOKANA) 300 MG TABS tablet Take 300 mg by mouth daily before breakfast.     Allergies:   Patient has no known allergies.   Social History   Tobacco Use  . Smoking status: Current Some Day Smoker    Packs/day: 0.10  . Smokeless tobacco: Never Used  . Tobacco comment: 4-5 ciggs per day  Substance Use Topics  . Alcohol use: No  . Drug use: Not on file     Family Hx: The patient's family history includes Heart Problems in his father; Heart attack in his sister.  ROS:   Please see the history of present illness.     All other systems reviewed  and are negative.   Prior CV studies:   The following studies were reviewed today:  09/2016 echo Study Conclusions  - Left ventricle: The cavity size was normal. Wall thickness was normal. Systolic function was at the lower limits of normal. The estimated ejection fraction was in the range of 50% to 55%. Doppler parameters are consistent with abnormal left ventricular relaxation (grade 1 diastolic dysfunction). Doppler parameters are consistent with high ventricular filling pressure. - Regional wall motion abnormality: Mild hypokinesis of the basal-mid inferior and mid  inferolateral myocardium. - Aortic valve: Moderately to severely calcified annulus. Moderately thickened, moderately calcified leaflets. There was moderate to severe stenosis. There was mild to moderate regurgitation. Peak velocity (S): 377 cm/s. Mean gradient (S): 30 mm Hg. Valve area (VTI): 0.83 cm^2. Valve area (Vmax): 0.78 cm^2. Valve area (Vmean): 0.8 cm^2. - Aorta: Mild aortic root dilatation. Aortic root dimension: 40 mm (ED). - Mitral valve: Moderately to severely calcified annulus. - Left atrium: The atrium was severely dilated.   07/2017 echo  Study Conclusions  - Left ventricle: The cavity size was normal. Wall thickness was normal. Systolic function was normal. The estimated ejection fraction was in the range of 55% to 60%. Wall motion was normal; there were no regional wall motion abnormalities. Doppler parameters are consistent with abnormal left ventricular relaxation (grade 1 diastolic dysfunction). Doppler parameters are consistent with high ventricular filling pressure. - Aortic valve: Moderately to severely calcified annulus. Trileaflet; moderately calcified leaflets. There was moderate stenosis. There was moderate regurgitation. Peak velocity (S): 323 cm/s. Mean gradient (S): 22 mm Hg. Valve area (VTI): 1.3 cm^2. Valve area (Vmax): 1.1 cm^2. Valve area (Vmean): 1.09 cm^2. - Mitral valve: Calcified annulus.  08/2018 echo IMPRESSIONS   1. The left ventricle has normal systolic function, with an ejection fraction of 55-60%. The cavity size was normal. There is mildly increased left ventricular wall thickness. Left ventricular diastolic Doppler parameters are consistent with impaired  relaxation. Possible basal inferolateral hypokinesis . 2. The right ventricle has normal systolic function. The cavity was normal. There is no increase in right ventricular wall thickness. Right ventricular systolic pressure normal with an  estimated pressure of 9.2 mmHg. 3. The aortic valve is functionally bicuspid. Moderate calcification of the aortic valve. Aortic valve regurgitation is mild by color flow Doppler. Moderate stenosis of the aortic valve. LVOT/AV VTI 0.37 and AVA approximately 1.2 cm2. Mild aortic  annular calcification noted. 4. The mitral valve is degenerative. Mild thickening of the mitral valve leaflet. There is moderate mitral annular calcification present. 5. The tricuspid valve is grossly normal.  Labs/Other Tests and Data Reviewed:    EKG:  No ECG reviewed.  Recent Labs: No results found for requested labs within last 8760 hours.   Recent Lipid Panel No results found for: CHOL, TRIG, HDL, CHOLHDL, LDLCALC, LDLDIRECT  Wt Readings from Last 3 Encounters:  08/28/19 200 lb (90.7 kg)  12/06/18 208 lb (94.3 kg)  05/30/18 201 lb 12.8 oz (91.5 kg)     Objective:    Vital Signs:  Ht 6\' 1"  (1.854 m)   Wt 200 lb (90.7 kg)   BMI 26.39 kg/m    Normal affect. Normal speech pattern and tone. Comfortable, no audible signs of SOB or wheezing.   ASSESSMENT & PLAN:    1. Aortic stenosis - stable moderate asymptomatic AS - repeat surveillance echo  2. Hyperlipidemia - he will contiue statin - request pcp labs  COVID-19 Education: The signs and symptoms of COVID-19  were discussed with the patient and how to seek care for testing (follow up with PCP or arrange E-visit).  The importance of social distancing was discussed today.  Time:   Today, I have spent 20 minutes with the patient with telehealth technology discussing the above problems.     Medication Adjustments/Labs and Tests Ordered: Current medicines are reviewed at length with the patient today.  Concerns regarding medicines are outlined above.   Tests Ordered: No orders of the defined types were placed in this encounter.   Medication Changes: No orders of the defined types were placed in this encounter.   Follow Up:  Either In  Person or Virtual in 6 month(s)  Signed, Carlyle Dolly, MD  08/28/2019 12:27 PM    [place]

## 2019-08-28 NOTE — Telephone Encounter (Signed)
  Precert needed for: Echo   Location:   [facility]   Date: September 26, 2019 

## 2019-08-28 NOTE — Patient Instructions (Signed)
Your physician wants you to follow-up in: 6 MONTHS WITH DR. BRANCH You will receive a reminder letter in the mail two months in advance. If you don't receive a letter, please call our office to schedule the follow-up appointment.  Your physician recommends that you continue on your current medications as directed. Please refer to the Current Medication list given to you today.  Your physician has requested that you have an echocardiogram. Echocardiography is a painless test that uses sound waves to create images of your heart. It provides your doctor with information about the size and shape of your heart and how well your heart's chambers and valves are working. This procedure takes approximately one hour. There are no restrictions for this procedure.  Thank you for choosing [facility]!!   

## 2019-08-29 DIAGNOSIS — H35341 Macular cyst, hole, or pseudohole, right eye: Secondary | ICD-10-CM | POA: Diagnosis not present

## 2019-08-29 DIAGNOSIS — H40019 Open angle with borderline findings, low risk, unspecified eye: Secondary | ICD-10-CM | POA: Diagnosis not present

## 2019-08-29 DIAGNOSIS — H43822 Vitreomacular adhesion, left eye: Secondary | ICD-10-CM | POA: Diagnosis not present

## 2019-09-17 DIAGNOSIS — J019 Acute sinusitis, unspecified: Secondary | ICD-10-CM | POA: Diagnosis not present

## 2019-09-17 DIAGNOSIS — R519 Headache, unspecified: Secondary | ICD-10-CM | POA: Diagnosis not present

## 2019-09-25 DIAGNOSIS — H40013 Open angle with borderline findings, low risk, bilateral: Secondary | ICD-10-CM | POA: Diagnosis not present

## 2019-09-26 ENCOUNTER — Ambulatory Visit (INDEPENDENT_AMBULATORY_CARE_PROVIDER_SITE_OTHER): Payer: Medicare HMO

## 2019-09-26 ENCOUNTER — Other Ambulatory Visit: Payer: Self-pay

## 2019-09-26 DIAGNOSIS — I350 Nonrheumatic aortic (valve) stenosis: Secondary | ICD-10-CM | POA: Diagnosis not present

## 2019-10-01 DIAGNOSIS — L821 Other seborrheic keratosis: Secondary | ICD-10-CM | POA: Diagnosis not present

## 2019-10-01 DIAGNOSIS — L649 Androgenic alopecia, unspecified: Secondary | ICD-10-CM | POA: Diagnosis not present

## 2019-10-01 DIAGNOSIS — L209 Atopic dermatitis, unspecified: Secondary | ICD-10-CM | POA: Diagnosis not present

## 2019-10-01 DIAGNOSIS — L639 Alopecia areata, unspecified: Secondary | ICD-10-CM | POA: Diagnosis not present

## 2019-10-02 ENCOUNTER — Telehealth: Payer: Self-pay | Admitting: *Deleted

## 2019-10-02 NOTE — Telephone Encounter (Signed)
LM to return call.

## 2019-10-02 NOTE — Telephone Encounter (Signed)
-----   Message from Antoine Poche, MD sent at 10/02/2019  1:10 PM EDT ----- Aortic stenosis remains moderate, we will continue to monitor   Dominga Ferry MD

## 2019-10-11 ENCOUNTER — Encounter: Payer: Self-pay | Admitting: *Deleted

## 2019-10-11 NOTE — Telephone Encounter (Signed)
Left messages with no return call - letter mailed

## 2019-10-14 DIAGNOSIS — N183 Chronic kidney disease, stage 3 unspecified: Secondary | ICD-10-CM | POA: Diagnosis not present

## 2019-10-16 DIAGNOSIS — E119 Type 2 diabetes mellitus without complications: Secondary | ICD-10-CM | POA: Diagnosis not present

## 2019-10-16 DIAGNOSIS — N183 Chronic kidney disease, stage 3 unspecified: Secondary | ICD-10-CM | POA: Diagnosis not present

## 2019-10-16 DIAGNOSIS — D649 Anemia, unspecified: Secondary | ICD-10-CM | POA: Diagnosis not present

## 2019-10-16 DIAGNOSIS — I10 Essential (primary) hypertension: Secondary | ICD-10-CM | POA: Diagnosis not present

## 2019-10-23 ENCOUNTER — Ambulatory Visit (INDEPENDENT_AMBULATORY_CARE_PROVIDER_SITE_OTHER): Payer: Medicare HMO

## 2019-10-23 ENCOUNTER — Other Ambulatory Visit: Payer: Self-pay

## 2019-10-23 ENCOUNTER — Encounter: Payer: Self-pay | Admitting: Orthopaedic Surgery

## 2019-10-23 ENCOUNTER — Ambulatory Visit (INDEPENDENT_AMBULATORY_CARE_PROVIDER_SITE_OTHER): Payer: Medicare HMO | Admitting: Orthopaedic Surgery

## 2019-10-23 VITALS — Ht 73.0 in | Wt 220.0 lb

## 2019-10-23 DIAGNOSIS — M5441 Lumbago with sciatica, right side: Secondary | ICD-10-CM

## 2019-10-23 DIAGNOSIS — M5442 Lumbago with sciatica, left side: Secondary | ICD-10-CM

## 2019-10-23 DIAGNOSIS — M79605 Pain in left leg: Secondary | ICD-10-CM

## 2019-10-23 DIAGNOSIS — G8929 Other chronic pain: Secondary | ICD-10-CM | POA: Diagnosis not present

## 2019-10-23 DIAGNOSIS — M79604 Pain in right leg: Secondary | ICD-10-CM

## 2019-10-23 DIAGNOSIS — M545 Low back pain, unspecified: Secondary | ICD-10-CM | POA: Insufficient documentation

## 2019-10-23 NOTE — Progress Notes (Signed)
Office Visit Note   Patient: Tristan Brooks           Date of Birth: [birth year]           MRN: [id number] Visit Date: 10/23/2019              Requested by: Monico Blitz, MD [address] [place],  [place] [address] PCP: Monico Blitz, MD   Assessment & Plan: Visit Diagnoses:  1. Bilateral leg pain   2. Chronic bilateral low back pain with bilateral sciatica     Plan: Mr. Cupples has significant degenerative arthritis of the lumbar spine and is experiencing bilateral lower extremity pain, claudication and numbness and tingling.  I bet he has spinal stenosis.  We will obtain an MRI scan.  He is diabetic and could have a vascular etiology as well but will start with the back  Follow-Up Instructions: Return After MRI scan lumbar spine.   Orders:  Orders Placed This Encounter  Procedures  . XR Lumbar Spine 2-3 Views   No orders of the defined types were placed in this encounter.     Procedures: No procedures performed   Clinical Data: No additional findings.   Subjective: Chief Complaint  Patient presents with  . Right Leg - Pain  . Left Leg - Pain  Patient presents today for bilateral leg pain. He said that his legs hurt all the way down to his feet, and have been doing so for at least 2 months. No known injury. He has numbness and tingling in his legs as well. The pain is worse with lying down and walking.  He has a sensation of his legs feeling heavy and tired. He takes Gabapentin 300mg  at bedtime.  He is diabetic and taking Actos and Amaryl  HPI  Review of Systems   Objective: Vital Signs: Ht 6\' 1"  (1.854 m)   Wt 220 lb (99.8 kg)   BMI 29.03 kg/m   Physical Exam Constitutional:      Appearance: He is well-developed.  Eyes:     Pupils: Pupils are equal, round, and reactive to light.  Pulmonary:     Effort: Pulmonary effort is normal.  Skin:    General: Skin is warm and dry.  Neurological:     Mental Status: He is alert and oriented to person, place, and  time.  Psychiatric:        Behavior: Behavior normal.     Ortho Exam awake alert and oriented x3.  Comfortable sitting.  Does have pitting edema both ankles.  Feet are warm.  Did not feel good pulses but he had good capillary refill to the toes.  Painless range of motion of both of his hips.  Straight leg raise was negative.  Very minimal percussible tenderness of the lumbar spine. Specialty Comments:  No specialty comments available.  Imaging: XR Lumbar Spine 2-3 Views  Result Date: 10/23/2019 Films lumbar spine were obtained in several projections.  There is a degenerative scoliosis to the right of approximately 10 degrees.  There is complete collapse of the L4-5 disc space.  I do not think there is a compression fracture.  There is narrowing of the L5-S1 disc space with slight anterior listhesis.  Diffuse calcification of the abdominal aorta without obvious aneurysmal dilatation .facet sclerosis at L4-5 and L5-S1    PMFS History: Patient Active Problem List   Diagnosis Date Noted  . Low back pain 10/23/2019   Past Medical History:  Diagnosis Date  . Diabetes  mellitus without complication (HCC)   . Neuropathy     Family History  Problem Relation Age of Onset  . Heart Problems Father   . Heart attack Sister     Past Surgical History:  Procedure Laterality Date  . TOTAL KNEE ARTHROPLASTY     Social History   Occupational History  . Not on file  Tobacco Use  . Smoking status: Current Some Day Smoker    Packs/day: 0.10  . Smokeless tobacco: Never Used  . Tobacco comment: 4-5 ciggs per day  Substance and Sexual Activity  . Alcohol use: No  . Drug use: Not on file  . Sexual activity: Not on file

## 2019-10-23 NOTE — Addendum Note (Signed)
Addended by: Wendi Maya on: 10/23/2019 03:11 PM   Modules accepted: Orders

## 2019-10-31 DIAGNOSIS — E119 Type 2 diabetes mellitus without complications: Secondary | ICD-10-CM | POA: Diagnosis not present

## 2019-10-31 DIAGNOSIS — E1142 Type 2 diabetes mellitus with diabetic polyneuropathy: Secondary | ICD-10-CM | POA: Diagnosis not present

## 2019-10-31 DIAGNOSIS — E785 Hyperlipidemia, unspecified: Secondary | ICD-10-CM | POA: Diagnosis not present

## 2019-10-31 DIAGNOSIS — N183 Chronic kidney disease, stage 3 unspecified: Secondary | ICD-10-CM | POA: Diagnosis not present

## 2019-10-31 DIAGNOSIS — M1990 Unspecified osteoarthritis, unspecified site: Secondary | ICD-10-CM | POA: Diagnosis not present

## 2019-11-13 DIAGNOSIS — M47817 Spondylosis without myelopathy or radiculopathy, lumbosacral region: Secondary | ICD-10-CM | POA: Diagnosis not present

## 2019-11-13 DIAGNOSIS — E882 Lipomatosis, not elsewhere classified: Secondary | ICD-10-CM | POA: Diagnosis not present

## 2019-11-13 DIAGNOSIS — M79605 Pain in left leg: Secondary | ICD-10-CM | POA: Diagnosis not present

## 2019-11-13 DIAGNOSIS — M545 Low back pain: Secondary | ICD-10-CM | POA: Diagnosis not present

## 2019-11-13 DIAGNOSIS — M5136 Other intervertebral disc degeneration, lumbar region: Secondary | ICD-10-CM | POA: Diagnosis not present

## 2019-11-13 DIAGNOSIS — G8929 Other chronic pain: Secondary | ICD-10-CM | POA: Diagnosis not present

## 2019-11-13 DIAGNOSIS — M5442 Lumbago with sciatica, left side: Secondary | ICD-10-CM | POA: Diagnosis not present

## 2019-11-13 DIAGNOSIS — M4316 Spondylolisthesis, lumbar region: Secondary | ICD-10-CM | POA: Diagnosis not present

## 2019-11-13 DIAGNOSIS — M419 Scoliosis, unspecified: Secondary | ICD-10-CM | POA: Diagnosis not present

## 2019-11-20 ENCOUNTER — Other Ambulatory Visit: Payer: Self-pay

## 2019-11-20 ENCOUNTER — Encounter: Payer: Self-pay | Admitting: Orthopaedic Surgery

## 2019-11-20 ENCOUNTER — Ambulatory Visit (INDEPENDENT_AMBULATORY_CARE_PROVIDER_SITE_OTHER): Payer: Medicare HMO | Admitting: Orthopaedic Surgery

## 2019-11-20 VITALS — Ht 73.0 in | Wt 220.0 lb

## 2019-11-20 DIAGNOSIS — M5441 Lumbago with sciatica, right side: Secondary | ICD-10-CM | POA: Diagnosis not present

## 2019-11-20 DIAGNOSIS — G8929 Other chronic pain: Secondary | ICD-10-CM

## 2019-11-20 DIAGNOSIS — M5442 Lumbago with sciatica, left side: Secondary | ICD-10-CM | POA: Diagnosis not present

## 2019-11-20 NOTE — Progress Notes (Signed)
Office Visit Note   Patient: Tristan Brooks           Date of Birth: Oct 16, 1940           MRN: [id number] Visit Date: 11/20/2019              Requested by: Gaspar Skeeters, MD [address] [place],  [place] [address] PCP: Gaspar Skeeters, MD   Assessment & Plan: Visit Diagnoses:  1. Chronic bilateral low back pain with bilateral sciatica     Plan: Tristan Brooks had an MRI scan of his lumbar spine that was not significantly different from the scan in 2017.  He does have advanced degenerative disease at L3-4 and below with scoliosis and an L4-5 anterolisthesis.  There are advanced thecal sac effacement at L3-4 and L5-S1 due to degenerative disease and epidural lipomatosis.  There is also multilevel foraminal impingement with neural compression on the left at L3-4 and bilaterally at L4-5.  There are some areas of central stenosis.  Long discussion with Tristan Brooks regarding the above.  He did well with epidural steroid injections in the past.  We will reconsult Dr. Alvester Morin in our office for follow-up intervals injections and see him back over the next 4 to 6 weeks.  Follow-Up Instructions: Return if symptoms worsen or fail to improve.   Orders:  Orders Placed This Encounter  Procedures  . Ambulatory referral to Physical Medicine Rehab   No orders of the defined types were placed in this encounter.     Procedures: No procedures performed   Clinical Data: No additional findings.   Subjective: Chief Complaint  Patient presents with  . Lower Back - Follow-up    MRI review  Patient presents today for follow up on his lower back. He had an MRI of his L-Spine at [facility] on 11-13-2019. He is here today to go over those results.  HPI  Review of Systems   Objective: Vital Signs: Ht 6\' 1"  (1.854 m)   Wt 220 lb (99.8 kg)   BMI 29.03 kg/m   Physical Exam Constitutional:      Appearance: He is well-developed.  Eyes:     Pupils: Pupils are equal, round, and reactive to  light.  Pulmonary:     Effort: Pulmonary effort is normal.  Skin:    General: Skin is warm and dry.  Neurological:     Mental Status: He is alert and oriented to person, place, and time.  Psychiatric:        Behavior: Behavior normal.     Ortho Exam straight leg raise negative bilaterally.  No percussible tenderness lumbar spine.  Does have some altered sensibility both of his feet related to his diabetes have some pitting edema but motor exam appears to be intact Specialty Comments:  No specialty comments available.  Imaging: No results found.   PMFS History: Patient Active Problem List   Diagnosis Date Noted  . Low back pain 10/23/2019   Past Medical History:  Diagnosis Date  . Diabetes mellitus without complication (HCC)   . Neuropathy     Family History  Problem Relation Age of Onset  . Heart Problems Father   . Heart attack Sister     Past Surgical History:  Procedure Laterality Date  . TOTAL KNEE ARTHROPLASTY     Social History   Occupational History  . Not on file  Tobacco Use  . Smoking status: Current Some Day Smoker    Packs/day: 0.10  . Smokeless tobacco:  Never Used  . Tobacco comment: 4-5 ciggs per day  Substance and Sexual Activity  . Alcohol use: No  . Drug use: Not on file  . Sexual activity: Not on file

## 2019-12-17 ENCOUNTER — Ambulatory Visit: Payer: Self-pay

## 2019-12-17 ENCOUNTER — Other Ambulatory Visit: Payer: Self-pay

## 2019-12-17 ENCOUNTER — Ambulatory Visit (INDEPENDENT_AMBULATORY_CARE_PROVIDER_SITE_OTHER): Payer: Medicare HMO | Admitting: Physical Medicine and Rehabilitation

## 2019-12-17 ENCOUNTER — Encounter: Payer: Self-pay | Admitting: Physical Medicine and Rehabilitation

## 2019-12-17 VITALS — BP 146/77 | HR 74

## 2019-12-17 DIAGNOSIS — M5416 Radiculopathy, lumbar region: Secondary | ICD-10-CM | POA: Diagnosis not present

## 2019-12-17 MED ORDER — METHYLPREDNISOLONE ACETATE 80 MG/ML IJ SUSP
80.0000 mg | Freq: Once | INTRAMUSCULAR | Status: AC
  Administered 2019-12-17: 80 mg

## 2019-12-17 NOTE — Progress Notes (Signed)
Tristan Brooks - 79 y.o. male MRN [id number]  Date of birth: [birth year]  Office Visit Note: Visit Date: 12/17/2019 PCP: Gaspar Skeeters, MD Referred by: Gaspar Skeeters, MD  Subjective: Chief Complaint  Patient presents with  . Right Leg - Pain  . Left Leg - Pain   HPI:  Tristan Brooks is a 79 y.o. male who comes in today at the request of Dr. Norlene Campbell for planned Bilateral L3-L4 Thoracic epidural steroid injection with fluoroscopic guidance.  The patient has failed conservative care including home exercise, medications, time and activity modification.  This injection will be diagnostic and hopefully therapeutic.  Please see requesting physician notes for further details and justification.  MRI reviewed with images and spine model.  MRI reviewed in the note below.   ROS Otherwise per HPI.  Assessment & Plan: Visit Diagnoses:  1. Lumbar radiculopathy     Plan: No additional findings.   Meds & Orders:  Meds ordered this encounter  Medications  . methylPREDNISolone acetate (DEPO-MEDROL) injection 80 mg    Orders Placed This Encounter  Procedures  . XR C-ARM NO REPORT  . Epidural Steroid injection    Follow-up: Return for visit to requesting physician as needed.   Procedures: No procedures performed  Lumbosacral Transforaminal Epidural Steroid Injection - Sub-Pedicular Approach with Fluoroscopic Guidance  Patient: Tristan Brooks      Date of Birth: 05/03/1941 MRN: [id number] PCP: Gaspar Skeeters, MD      Visit Date: 12/17/2019   Universal Protocol:    Date/Time: 12/17/2019  Consent Given By: the patient  Position: PRONE  Additional Comments: Vital signs were monitored before and after the procedure. Patient was prepped and draped in the usual sterile fashion. The correct patient, procedure, and site was verified.   Injection Procedure Details:  Procedure Site One Meds Administered:  Meds ordered this encounter  Medications  .  methylPREDNISolone acetate (DEPO-MEDROL) injection 80 mg    Laterality: Bilateral  Location/Site:  L3-L4  Needle size: 22 G  Needle type: Spinal  Needle Placement: Transforaminal  Findings:    -Comments: Excellent flow of contrast along the nerve, nerve root and into the epidural space.  Procedure Details: After squaring off the end-plates to get a true AP view, the C-arm was positioned so that an oblique view of the foramen as noted above was visualized. The target area is just inferior to the "nose of the scotty dog" or sub pedicular. The soft tissues overlying this structure were infiltrated with 2-3 ml. of 1% Lidocaine without Epinephrine.  The spinal needle was inserted toward the target using a "trajectory" view along the fluoroscope beam.  Under AP and lateral visualization, the needle was advanced so it did not puncture dura and was located close the 6 O'Clock position of the pedical in AP tracterory. Biplanar projections were used to confirm position. Aspiration was confirmed to be negative for CSF and/or blood. A 1-2 ml. volume of Isovue-250 was injected and flow of contrast was noted at each level. Radiographs were obtained for documentation purposes.   After attaining the desired flow of contrast documented above, a 0.5 to 1.0 ml test dose of 0.25% Marcaine was injected into each respective transforaminal space.  The patient was observed for 90 seconds post injection.  After no sensory deficits were reported, and normal lower extremity motor function was noted,   the above injectate was administered so that equal amounts of the injectate were placed at each foramen (level) into the transforaminal  epidural space.   Additional Comments:  The patient tolerated the procedure well Dressing: 2 x 2 sterile gauze and Band-Aid    Post-procedure details: Patient was observed during the procedure. Post-procedure instructions were reviewed.  Patient left the clinic in stable  condition.     Clinical History: Interface, Rad Results In - 11/14/2019 6:38 AM EDT  Formatting of this note might be different from the original.  CLINICAL DATA: Low back pain radiating down both legs for 2 months   EXAM:  MRI LUMBAR SPINE WITHOUT CONTRAST   TECHNIQUE:  Multiplanar, multisequence MR imaging of the lumbar spine was  performed. No intravenous contrast was administered.   COMPARISON: 10/19/2015   FINDINGS:  Segmentation: Standard lumbar number   Alignment: Dextroscoliosis and L4-5 anterolisthesis   Vertebrae: No fracture, evidence of discitis, or bone lesion.   Conus medullaris and cauda equina: Conus extends to the T12 level.  Conus and cauda equina appear normal.   Paraspinal and other soft tissues: Renal cystic intensities.   Disc levels:   T12- L1: Unremarkable.   L1-L2: Disc narrowing and circumferential bulging. No neural  compression   L2-L3: Right eccentric disc bulging with small right inferior  foraminal protrusion that is noncompressive   L3-L4: Severe disc degeneration with asymmetric left-sided collapse  and spurring. There is facet osteoarthritis with asymmetric left  facet spurring. Epidural lipomatosis and degenerative disease causes  severe effacement of the thecal sac. Severe left foraminal  impingement   L4-L5: Advanced facet osteoarthritis with spurring and  anterolisthesis. The disc is narrowed and bulging. Advanced spinal  and biforaminal impingement   L5-S1:Disc narrowing and bulging. Facet osteoarthritis. Epidural  lipomatosis completely effaces the thecal sac and there ismoderate  right more than left foraminal impingement.   IMPRESSION:  1. Advanced degenerative disease at L3-4 and below with scoliosis  and L4-5 anterolisthesis.  2. Advanced thecal sac effacement at L3-4 to L5-S1 due to  degenerative disease and epidural lipomatosis.  3. Multilevel foraminal impingement with neural compression on the  left at  L3-4 and bilaterally at L4-5.  4. No focal or notable progression compared to 2017.    Electronically Signed   By: Marnee Spring M.D.   On: 11/14/2019 06:35 Specimen Collected: - Date: 11/14/19     Objective:  VS:  HT:    WT:   BMI:     BP:(!) 146/77  HR:74bpm  TEMP: ( )  RESP:  Physical Exam Constitutional:      General: He is not in acute distress.    Appearance: Normal appearance. He is not ill-appearing.  HENT:     Head: Normocephalic and atraumatic.     Right Ear: External ear normal.     Left Ear: External ear normal.  Eyes:     Extraocular Movements: Extraocular movements intact.  Cardiovascular:     Rate and Rhythm: Normal rate.     Pulses: Normal pulses.  Abdominal:     General: There is no distension.     Palpations: Abdomen is soft.  Musculoskeletal:        General: No tenderness or signs of injury.     Right lower leg: No edema.     Left lower leg: No edema.     Comments: Patient has good distal strength without clonus.  Skin:    Findings: No erythema or rash.  Neurological:     General: No focal deficit present.     Mental Status: He is alert and oriented to person, place,  and time.     Sensory: No sensory deficit.     Motor: No weakness or abnormal muscle tone.     Coordination: Coordination normal.  Psychiatric:        Mood and Affect: Mood normal.        Behavior: Behavior normal.      Imaging: XR C-ARM NO REPORT  Result Date: 12/17/2019 Please see Notes tab for imaging impression.

## 2019-12-17 NOTE — Procedures (Signed)
Lumbosacral Transforaminal Epidural Steroid Injection - Sub-Pedicular Approach with Fluoroscopic Guidance  Patient: Tristan Brooks      Date of Birth: 1940/12/16 MRN: [id number] PCP: Gaspar Skeeters, MD      Visit Date: 12/17/2019   Universal Protocol:    Date/Time: 12/17/2019  Consent Given By: the patient  Position: PRONE  Additional Comments: Vital signs were monitored before and after the procedure. Patient was prepped and draped in the usual sterile fashion. The correct patient, procedure, and site was verified.   Injection Procedure Details:  Procedure Site One Meds Administered:  Meds ordered this encounter  Medications  . methylPREDNISolone acetate (DEPO-MEDROL) injection 80 mg    Laterality: Bilateral  Location/Site:  L3-L4  Needle size: 22 G  Needle type: Spinal  Needle Placement: Transforaminal  Findings:    -Comments: Excellent flow of contrast along the nerve, nerve root and into the epidural space.  Procedure Details: After squaring off the end-plates to get a true AP view, the C-arm was positioned so that an oblique view of the foramen as noted above was visualized. The target area is just inferior to the "nose of the scotty dog" or sub pedicular. The soft tissues overlying this structure were infiltrated with 2-3 ml. of 1% Lidocaine without Epinephrine.  The spinal needle was inserted toward the target using a "trajectory" view along the fluoroscope beam.  Under AP and lateral visualization, the needle was advanced so it did not puncture dura and was located close the 6 O'Clock position of the pedical in AP tracterory. Biplanar projections were used to confirm position. Aspiration was confirmed to be negative for CSF and/or blood. A 1-2 ml. volume of Isovue-250 was injected and flow of contrast was noted at each level. Radiographs were obtained for documentation purposes.   After attaining the desired flow of contrast documented above, a 0.5 to 1.0  ml test dose of 0.25% Marcaine was injected into each respective transforaminal space.  The patient was observed for 90 seconds post injection.  After no sensory deficits were reported, and normal lower extremity motor function was noted,   the above injectate was administered so that equal amounts of the injectate were placed at each foramen (level) into the transforaminal epidural space.   Additional Comments:  The patient tolerated the procedure well Dressing: 2 x 2 sterile gauze and Band-Aid    Post-procedure details: Patient was observed during the procedure. Post-procedure instructions were reviewed.  Patient left the clinic in stable condition.

## 2019-12-17 NOTE — Progress Notes (Signed)
Pt states pain down the back of both legs all the way down. Pt states pain started about 3-4 months ago. Walking and lifting makes pain worse. Tylenol helps with pain.   Numeric Pain Rating Scale and Functional Assessment Average Pain 8   In the last MONTH (on 0-10 scale) has pain interfered with the following?  1. General activity like being  able to carry out your everyday physical activities such as walking, climbing stairs, carrying groceries, or moving a chair?  Rating(8)   +Driver, -BT, -Dye Allergies.

## 2020-01-01 DIAGNOSIS — E1142 Type 2 diabetes mellitus with diabetic polyneuropathy: Secondary | ICD-10-CM | POA: Diagnosis not present

## 2020-01-01 DIAGNOSIS — L84 Corns and callosities: Secondary | ICD-10-CM | POA: Diagnosis not present

## 2020-01-01 DIAGNOSIS — M79676 Pain in unspecified toe(s): Secondary | ICD-10-CM | POA: Diagnosis not present

## 2020-01-01 DIAGNOSIS — B351 Tinea unguium: Secondary | ICD-10-CM | POA: Diagnosis not present

## 2020-01-15 ENCOUNTER — Other Ambulatory Visit: Payer: Self-pay

## 2020-01-15 ENCOUNTER — Ambulatory Visit (INDEPENDENT_AMBULATORY_CARE_PROVIDER_SITE_OTHER): Payer: Medicare HMO

## 2020-01-15 ENCOUNTER — Ambulatory Visit (INDEPENDENT_AMBULATORY_CARE_PROVIDER_SITE_OTHER): Payer: Medicare HMO | Admitting: Orthopaedic Surgery

## 2020-01-15 ENCOUNTER — Encounter: Payer: Self-pay | Admitting: Orthopaedic Surgery

## 2020-01-15 VITALS — BP 116/76 | Ht 73.0 in | Wt 220.0 lb

## 2020-01-15 DIAGNOSIS — M1712 Unilateral primary osteoarthritis, left knee: Secondary | ICD-10-CM

## 2020-01-15 DIAGNOSIS — G8929 Other chronic pain: Secondary | ICD-10-CM | POA: Diagnosis not present

## 2020-01-15 DIAGNOSIS — M170 Bilateral primary osteoarthritis of knee: Secondary | ICD-10-CM

## 2020-01-15 DIAGNOSIS — M25562 Pain in left knee: Secondary | ICD-10-CM | POA: Diagnosis not present

## 2020-01-15 DIAGNOSIS — M1711 Unilateral primary osteoarthritis, right knee: Secondary | ICD-10-CM

## 2020-01-15 MED ORDER — METHYLPREDNISOLONE ACETATE 40 MG/ML IJ SUSP
80.0000 mg | INTRAMUSCULAR | Status: AC | PRN
  Administered 2020-01-15: 80 mg via INTRA_ARTICULAR

## 2020-01-15 MED ORDER — LIDOCAINE HCL 1 % IJ SOLN
2.0000 mL | INTRAMUSCULAR | Status: AC | PRN
  Administered 2020-01-15: 2 mL

## 2020-01-15 MED ORDER — BUPIVACAINE HCL 0.5 % IJ SOLN
2.0000 mL | INTRAMUSCULAR | Status: AC | PRN
  Administered 2020-01-15: 2 mL via INTRA_ARTICULAR

## 2020-01-15 NOTE — Progress Notes (Signed)
Office Visit Note   Patient: Tristan Brooks           Date of Birth: 08-29-1940           MRN: [id number] Visit Date: 01/15/2020              Requested by: Gaspar Skeeters, MD [address] [place],  [place] [address] PCP: Gaspar Skeeters, MD   Assessment & Plan: Visit Diagnoses:  1. Chronic pain of left knee   2. Bilateral primary osteoarthritis of knee     Plan: Mr. Vansickle has end-stage osteoarthritis of his right knee.  He has had a prior successful left total knee replacement.  Long discussion regarding the diagnosis and treatment options.  Today I will aspirate the knee and inject cortisone. Films of his right knee demonstrates end-stage changes.  Films of his left knee were also included.  There did not appear to be any complicating factors.  The prosthesis appears to be stable without evidence of loosening and excellent alignment.  Suspect a lot of the pain is having in his left knee might be related to his back  Follow-Up Instructions: Return if symptoms worsen or fail to improve.   Orders:  Orders Placed This Encounter  Procedures  . Large Joint Inj: R knee  . XR Knee 1-2 Views Left   No orders of the defined types were placed in this encounter.     Procedures: Large Joint Inj: R knee on 01/15/2020 3:35 PM Indications: pain and diagnostic evaluation Details: 25 G 1.5 in needle, anteromedial approach  Arthrogram: No  Medications: 2 mL lidocaine 1 %; 2 mL bupivacaine 0.5 %; 80 mg methylPREDNISolone acetate 40 MG/ML Aspirate: 65 mL serous Outcome: tolerated well, no immediate complications Procedure, treatment alternatives, risks and benefits explained, specific risks discussed. Consent was given by the patient. Immediately prior to procedure a time out was called to verify the correct patient, procedure, equipment, support staff and site/side marked as required. Patient was prepped and draped in the usual sterile fashion.       Clinical Data: No  additional findings.   Subjective: Chief Complaint  Patient presents with  . Left Knee - Pain  Patient presents to office with left knee pain. He denies any known injury and states that the pain level is 8 out of 10.  He had left total knee surgery in 2009. He has had recent problems with his lumbar spine and has undergone lumbar ESI on 12/17/2019.  This has helped his back pain, however, it has not really helped his left knee symptoms.  He also has been experiencing some numbness and tingling in the entire left leg which I suspect is related to his back. He notes that he is probably having more trouble with his right knee than he is with the left  HPI  Review of Systems   Objective: Vital Signs: BP 116/76   Ht 6\' 1"  (1.854 m)   Wt (!) 220 lb (99.8 kg)   BMI 29.03 kg/m   Physical Exam Constitutional:      Appearance: He is well-developed.  Eyes:     Pupils: Pupils are equal, round, and reactive to light.  Pulmonary:     Effort: Pulmonary effort is normal.  Skin:    General: Skin is warm and dry.  Neurological:     Mental Status: He is alert and oriented to person, place, and time.  Psychiatric:        Behavior: Behavior normal.  Ortho Exam right knee with large effusion.  Has both medial lateral joint pain.  Lacks a few degrees to full extension flexed about 100 degrees without instability.  Has both medial and lateral joint pain.  Some patellar crepitation but no pain with compression.  Left knee had full extension.  The knee was not hot warm red or swollen.  No effusion or evidence of instability.  Flexed over 100 degrees  Specialty Comments:  No specialty comments available.  Imaging: XR Knee 1-2 Views Left  Result Date: 01/15/2020 Films of the right knee were obtained in 3 projections standing.  There is bone-on-bone in the medial lateral compartments with normal alignment.  In both medial lateral compartments there is subchondral sclerosis, subchondral cysts and  large peripheral osteophytes but films are consistent with end-stage osteoarthritis.  No acute change    PMFS History: Patient Active Problem List   Diagnosis Date Noted  . Bilateral primary osteoarthritis of knee 01/15/2020  . Low back pain 10/23/2019   Past Medical History:  Diagnosis Date  . Diabetes mellitus without complication (HCC)   . Neuropathy     Family History  Problem Relation Age of Onset  . Heart Problems Father   . Heart attack Sister     Past Surgical History:  Procedure Laterality Date  . TOTAL KNEE ARTHROPLASTY     Social History   Occupational History  . Not on file  Tobacco Use  . Smoking status: Current Some Day Smoker    Packs/day: 0.10  . Smokeless tobacco: Never Used  . Tobacco comment: 4-5 ciggs per day  Substance and Sexual Activity  . Alcohol use: No  . Drug use: Not on file  . Sexual activity: Not on file

## 2020-01-29 DIAGNOSIS — N183 Chronic kidney disease, stage 3 unspecified: Secondary | ICD-10-CM | POA: Diagnosis not present

## 2020-02-03 DIAGNOSIS — N183 Chronic kidney disease, stage 3 unspecified: Secondary | ICD-10-CM | POA: Diagnosis not present

## 2020-02-03 DIAGNOSIS — E785 Hyperlipidemia, unspecified: Secondary | ICD-10-CM | POA: Diagnosis not present

## 2020-02-03 DIAGNOSIS — R61 Generalized hyperhidrosis: Secondary | ICD-10-CM | POA: Diagnosis not present

## 2020-02-03 DIAGNOSIS — Z125 Encounter for screening for malignant neoplasm of prostate: Secondary | ICD-10-CM | POA: Diagnosis not present

## 2020-02-03 DIAGNOSIS — E119 Type 2 diabetes mellitus without complications: Secondary | ICD-10-CM | POA: Diagnosis not present

## 2020-02-03 DIAGNOSIS — E1142 Type 2 diabetes mellitus with diabetic polyneuropathy: Secondary | ICD-10-CM | POA: Diagnosis not present

## 2020-02-04 DIAGNOSIS — E119 Type 2 diabetes mellitus without complications: Secondary | ICD-10-CM | POA: Diagnosis not present

## 2020-02-04 DIAGNOSIS — Z7984 Long term (current) use of oral hypoglycemic drugs: Secondary | ICD-10-CM | POA: Diagnosis not present

## 2020-02-04 DIAGNOSIS — K5900 Constipation, unspecified: Secondary | ICD-10-CM | POA: Diagnosis not present

## 2020-02-04 DIAGNOSIS — G8929 Other chronic pain: Secondary | ICD-10-CM | POA: Diagnosis not present

## 2020-02-04 DIAGNOSIS — K219 Gastro-esophageal reflux disease without esophagitis: Secondary | ICD-10-CM | POA: Diagnosis not present

## 2020-02-04 DIAGNOSIS — R69 Illness, unspecified: Secondary | ICD-10-CM | POA: Diagnosis not present

## 2020-02-04 DIAGNOSIS — Z6830 Body mass index (BMI) 30.0-30.9, adult: Secondary | ICD-10-CM | POA: Diagnosis not present

## 2020-02-04 DIAGNOSIS — E785 Hyperlipidemia, unspecified: Secondary | ICD-10-CM | POA: Diagnosis not present

## 2020-02-04 DIAGNOSIS — E669 Obesity, unspecified: Secondary | ICD-10-CM | POA: Diagnosis not present

## 2020-02-04 DIAGNOSIS — R030 Elevated blood-pressure reading, without diagnosis of hypertension: Secondary | ICD-10-CM | POA: Diagnosis not present

## 2020-02-10 DIAGNOSIS — D649 Anemia, unspecified: Secondary | ICD-10-CM | POA: Diagnosis not present

## 2020-02-10 DIAGNOSIS — N183 Chronic kidney disease, stage 3 unspecified: Secondary | ICD-10-CM | POA: Diagnosis not present

## 2020-02-10 DIAGNOSIS — I10 Essential (primary) hypertension: Secondary | ICD-10-CM | POA: Diagnosis not present

## 2020-02-10 DIAGNOSIS — I340 Nonrheumatic mitral (valve) insufficiency: Secondary | ICD-10-CM | POA: Diagnosis not present

## 2020-02-28 ENCOUNTER — Other Ambulatory Visit: Payer: Self-pay

## 2020-02-28 ENCOUNTER — Ambulatory Visit: Payer: Medicare HMO | Admitting: Cardiology

## 2020-02-28 ENCOUNTER — Encounter: Payer: Self-pay | Admitting: *Deleted

## 2020-02-28 ENCOUNTER — Encounter: Payer: Self-pay | Admitting: Cardiology

## 2020-02-28 VITALS — BP 122/70 | HR 91 | Ht 73.0 in | Wt 229.0 lb

## 2020-02-28 DIAGNOSIS — E782 Mixed hyperlipidemia: Secondary | ICD-10-CM

## 2020-02-28 DIAGNOSIS — I350 Nonrheumatic aortic (valve) stenosis: Secondary | ICD-10-CM

## 2020-02-28 NOTE — Patient Instructions (Signed)

## 2020-02-28 NOTE — Progress Notes (Signed)
Clinical Summary Tristan Brooks is a 79 y.o.male seen today for follow up of the following medical problems.  1. Aortic stenosis - echo 09/2016 mean grad 30, AVA VTI 0.83, VTI dimensionless index 0.24. Overall moderate to severe AS.  07/2017 echo: LVEF 55-60%, mod AS, mod AI. AS mean grad 22, AVA VTI 1.3, dimensionless index 0.32  08/2018 echo LVEF 55-60%, grade I diastolic dysfunction, moderate AS AVA VTI 1.2 AV mean 20    09/2019 echo LVEF 60-65%, grade I DDx. Moderate AS (mean grad 15, AVA VTI 0.9, DI 0.51. LVOT Diam 1.5) - the difference from 08/2018 appears to be a smaller LVOT diameter used.   - no recent SOB, no chest pains    2. Hyperlipidemia -compliant with statin - recent labsl with pcp   Has had covid vaccine   Wife passed recently 02/2020  Past Medical History:  Diagnosis Date  . Diabetes mellitus without complication (HCC)   . Neuropathy      No Known Allergies   Current Outpatient Medications  Medication Sig Dispense Refill  . aspirin EC 81 MG tablet Take 1 tablet (81 mg total) by mouth daily. 90 tablet 3  . atorvastatin (LIPITOR) 10 MG tablet Take 10 mg by mouth daily.    . Exenatide ER (BYDUREON) 2 MG PEN Inject into the skin once a week.    Marland Kitchen glimepiride (AMARYL) 2 MG tablet Take 2 mg by mouth daily with breakfast.    . Multiple Vitamins-Minerals (CENTRUM SILVER PO) Take by mouth.    . Omega-3 Fatty Acids (FISH OIL) 1360 MG CAPS Take 1 capsule by mouth daily.    . pantoprazole (PROTONIX) 40 MG tablet Take 40 mg by mouth daily.    . pioglitazone (ACTOS) 30 MG tablet Take 30 mg by mouth daily.     No current facility-administered medications for this visit.     Past Surgical History:  Procedure Laterality Date  . TOTAL KNEE ARTHROPLASTY       No Known Allergies    Family History  Problem Relation Age of Onset  . Heart Problems Father   . Heart attack Sister      Social History Tristan Brooks reports that he has been  smoking. He has been smoking about 0.10 packs per day. He has never used smokeless tobacco. Tristan Brooks reports no history of alcohol use.   Review of Systems CONSTITUTIONAL: No weight loss, fever, chills, weakness or fatigue.  HEENT: Eyes: No visual loss, blurred vision, double vision or yellow sclerae.No hearing loss, sneezing, congestion, runny nose or sore throat.  SKIN: No rash or itching.  CARDIOVASCULAR: per hpi RESPIRATORY: No shortness of breath, cough or sputum.  GASTROINTESTINAL: No anorexia, nausea, vomiting or diarrhea. No abdominal pain or blood.  GENITOURINARY: No burning on urination, no polyuria NEUROLOGICAL: No headache, dizziness, syncope, paralysis, ataxia, numbness or tingling in the extremities. No change in bowel or bladder control.  MUSCULOSKELETAL: No muscle, back pain, joint pain or stiffness.  LYMPHATICS: No enlarged nodes. No history of splenectomy.  PSYCHIATRIC: No history of depression or anxiety.  ENDOCRINOLOGIC: No reports of sweating, cold or heat intolerance. No polyuria or polydipsia.  Marland Kitchen   Physical Examination Today's Vitals   02/28/20 1422  BP: 122/70  Pulse: 91  SpO2: 95%  Weight: 229 lb (103.9 kg)  Height: 6\' 1"  (1.854 m)   Body mass index is 30.21 kg/m.  Gen: resting comfortably, no acute distress HEENT: no scleral icterus, pupils equal round and  reactive, no palptable cervical adenopathy,  CV: RRR, 3/6 systolic murmur rusb, no jvd Resp: Clear to auscultation bilaterally GI: abdomen is soft, non-tender, non-distended, normal bowel sounds, no hepatosplenomegaly MSK: extremities are warm, no edema.  Skin: warm, no rash Neuro:  no focal deficits Psych: appropriate affect   Diagnostic Studies 09/2016 echo Study Conclusions  - Left ventricle: The cavity size was normal. Wall thickness was normal. Systolic function was at the lower limits of normal. The estimated ejection fraction was in the range of 50% to 55%. Doppler  parameters are consistent with abnormal left ventricular relaxation (grade 1 diastolic dysfunction). Doppler parameters are consistent with high ventricular filling pressure. - Regional wall motion abnormality: Mild hypokinesis of the basal-mid inferior and mid inferolateral myocardium. - Aortic valve: Moderately to severely calcified annulus. Moderately thickened, moderately calcified leaflets. There was moderate to severe stenosis. There was mild to moderate regurgitation. Peak velocity (S): 377 cm/s. Mean gradient (S): 30 mm Hg. Valve area (VTI): 0.83 cm^2. Valve area (Vmax): 0.78 cm^2. Valve area (Vmean): 0.8 cm^2. - Aorta: Mild aortic root dilatation. Aortic root dimension: 40 mm (ED). - Mitral valve: Moderately to severely calcified annulus. - Left atrium: The atrium was severely dilated.   07/2017 echo  Study Conclusions  - Left ventricle: The cavity size was normal. Wall thickness was normal. Systolic function was normal. The estimated ejection fraction was in the range of 55% to 60%. Wall motion was normal; there were no regional wall motion abnormalities. Doppler parameters are consistent with abnormal left ventricular relaxation (grade 1 diastolic dysfunction). Doppler parameters are consistent with high ventricular filling pressure. - Aortic valve: Moderately to severely calcified annulus. Trileaflet; moderately calcified leaflets. There was moderate stenosis. There was moderate regurgitation. Peak velocity (S): 323 cm/s. Mean gradient (S): 22 mm Hg. Valve area (VTI): 1.3 cm^2. Valve area (Vmax): 1.1 cm^2. Valve area (Vmean): 1.09 cm^2. - Mitral valve: Calcified annulus.  08/2018 echo IMPRESSIONS   1. The left ventricle has normal systolic function, with an ejection fraction of 55-60%. The cavity size was normal. There is mildly increased left ventricular wall thickness. Left ventricular diastolic Doppler parameters are  consistent with impaired  relaxation. Possible basal inferolateral hypokinesis . 2. The right ventricle has normal systolic function. The cavity was normal. There is no increase in right ventricular wall thickness. Right ventricular systolic pressure normal with an estimated pressure of 9.2 mmHg. 3. The aortic valve is functionally bicuspid. Moderate calcification of the aortic valve. Aortic valve regurgitation is mild by color flow Doppler. Moderate stenosis of the aortic valve. LVOT/AV VTI 0.37 and AVA approximately 1.2 cm2. Mild aortic  annular calcification noted. 4. The mitral valve is degenerative. Mild thickening of the mitral valve leaflet. There is moderate mitral annular calcification present. 5. The tricuspid valve is grossly normal.  09/2019 echo 1. Left ventricular ejection fraction, by estimation, is 60 to 65%. The  left ventricle has normal function. The left ventricle has no regional  wall motion abnormalities. There is severe concentric left ventricular  hypertrophy. Left ventricular diastolic  parameters are consistent with Grade I diastolic dysfunction (impaired  relaxation). Elevated left ventricular end-diastolic pressure.  2. Right ventricular systolic function is low normal. The right  ventricular size is normal.  3. The mitral valve is degenerative. No evidence of mitral valve  regurgitation. Mild mitral stenosis.  4. Aortic valve leaflets were poorly visualized. Overall, aortic stenosis  appeared moderate in severity.. The aortic valve has an indeterminant  number of cusps. Aortic valve  regurgitation is mild. Moderate aortic valve  stenosis.     Assessment and Plan  1. Aortic stenosis -moderate AS, remains asymptomatic - continue to monitor.   2. Hyperlipidemia -continue statin, request labs from pcp  EKG shows SR, LVH with strain pattern   F/u 6 months      Antoine Poche, M.D.

## 2020-04-01 DIAGNOSIS — H40013 Open angle with borderline findings, low risk, bilateral: Secondary | ICD-10-CM | POA: Diagnosis not present

## 2020-04-01 DIAGNOSIS — H26492 Other secondary cataract, left eye: Secondary | ICD-10-CM | POA: Diagnosis not present

## 2020-04-14 DIAGNOSIS — H43822 Vitreomacular adhesion, left eye: Secondary | ICD-10-CM | POA: Diagnosis not present

## 2020-04-14 DIAGNOSIS — H35341 Macular cyst, hole, or pseudohole, right eye: Secondary | ICD-10-CM | POA: Diagnosis not present

## 2020-04-28 DIAGNOSIS — H26492 Other secondary cataract, left eye: Secondary | ICD-10-CM | POA: Diagnosis not present

## 2020-05-20 DIAGNOSIS — N183 Chronic kidney disease, stage 3 unspecified: Secondary | ICD-10-CM | POA: Diagnosis not present

## 2020-05-20 DIAGNOSIS — E785 Hyperlipidemia, unspecified: Secondary | ICD-10-CM | POA: Diagnosis not present

## 2020-05-20 DIAGNOSIS — E119 Type 2 diabetes mellitus without complications: Secondary | ICD-10-CM | POA: Diagnosis not present

## 2020-05-20 DIAGNOSIS — M1990 Unspecified osteoarthritis, unspecified site: Secondary | ICD-10-CM | POA: Diagnosis not present

## 2020-05-20 DIAGNOSIS — Z0000 Encounter for general adult medical examination without abnormal findings: Secondary | ICD-10-CM | POA: Diagnosis not present

## 2020-05-20 DIAGNOSIS — E1142 Type 2 diabetes mellitus with diabetic polyneuropathy: Secondary | ICD-10-CM | POA: Diagnosis not present

## 2020-07-14 DIAGNOSIS — N183 Chronic kidney disease, stage 3 unspecified: Secondary | ICD-10-CM | POA: Diagnosis not present

## 2020-07-29 DIAGNOSIS — I340 Nonrheumatic mitral (valve) insufficiency: Secondary | ICD-10-CM | POA: Diagnosis not present

## 2020-07-29 DIAGNOSIS — D649 Anemia, unspecified: Secondary | ICD-10-CM | POA: Diagnosis not present

## 2020-07-29 DIAGNOSIS — I10 Essential (primary) hypertension: Secondary | ICD-10-CM | POA: Diagnosis not present

## 2020-07-29 DIAGNOSIS — N183 Chronic kidney disease, stage 3 unspecified: Secondary | ICD-10-CM | POA: Diagnosis not present

## 2020-08-06 DIAGNOSIS — M79676 Pain in unspecified toe(s): Secondary | ICD-10-CM | POA: Diagnosis not present

## 2020-08-06 DIAGNOSIS — E1142 Type 2 diabetes mellitus with diabetic polyneuropathy: Secondary | ICD-10-CM | POA: Diagnosis not present

## 2020-08-06 DIAGNOSIS — L84 Corns and callosities: Secondary | ICD-10-CM | POA: Diagnosis not present

## 2020-08-06 DIAGNOSIS — B351 Tinea unguium: Secondary | ICD-10-CM | POA: Diagnosis not present

## 2020-08-27 ENCOUNTER — Ambulatory Visit: Payer: Medicare HMO | Admitting: Cardiology

## 2020-09-10 DIAGNOSIS — E1169 Type 2 diabetes mellitus with other specified complication: Secondary | ICD-10-CM | POA: Diagnosis not present

## 2020-09-10 DIAGNOSIS — Z6830 Body mass index (BMI) 30.0-30.9, adult: Secondary | ICD-10-CM | POA: Diagnosis not present

## 2020-09-10 DIAGNOSIS — K219 Gastro-esophageal reflux disease without esophagitis: Secondary | ICD-10-CM | POA: Diagnosis not present

## 2020-09-10 DIAGNOSIS — E7800 Pure hypercholesterolemia, unspecified: Secondary | ICD-10-CM | POA: Diagnosis not present

## 2020-09-10 DIAGNOSIS — N189 Chronic kidney disease, unspecified: Secondary | ICD-10-CM | POA: Diagnosis not present

## 2020-09-17 DIAGNOSIS — B369 Superficial mycosis, unspecified: Secondary | ICD-10-CM | POA: Diagnosis not present

## 2020-09-17 DIAGNOSIS — Z0001 Encounter for general adult medical examination with abnormal findings: Secondary | ICD-10-CM | POA: Diagnosis not present

## 2020-09-17 DIAGNOSIS — E7801 Familial hypercholesterolemia: Secondary | ICD-10-CM | POA: Diagnosis not present

## 2020-09-17 DIAGNOSIS — N189 Chronic kidney disease, unspecified: Secondary | ICD-10-CM | POA: Diagnosis not present

## 2020-09-17 DIAGNOSIS — Z6830 Body mass index (BMI) 30.0-30.9, adult: Secondary | ICD-10-CM | POA: Diagnosis not present

## 2020-09-17 DIAGNOSIS — K219 Gastro-esophageal reflux disease without esophagitis: Secondary | ICD-10-CM | POA: Diagnosis not present

## 2020-09-17 DIAGNOSIS — E1169 Type 2 diabetes mellitus with other specified complication: Secondary | ICD-10-CM | POA: Diagnosis not present

## 2020-10-06 ENCOUNTER — Encounter: Payer: Self-pay | Admitting: Cardiology

## 2020-10-06 DIAGNOSIS — N1832 Chronic kidney disease, stage 3b: Secondary | ICD-10-CM | POA: Diagnosis not present

## 2020-10-06 DIAGNOSIS — B369 Superficial mycosis, unspecified: Secondary | ICD-10-CM | POA: Diagnosis not present

## 2020-10-06 DIAGNOSIS — I38 Endocarditis, valve unspecified: Secondary | ICD-10-CM | POA: Diagnosis not present

## 2020-10-06 DIAGNOSIS — Z6830 Body mass index (BMI) 30.0-30.9, adult: Secondary | ICD-10-CM | POA: Diagnosis not present

## 2020-10-06 DIAGNOSIS — K219 Gastro-esophageal reflux disease without esophagitis: Secondary | ICD-10-CM | POA: Diagnosis not present

## 2020-10-06 DIAGNOSIS — E1169 Type 2 diabetes mellitus with other specified complication: Secondary | ICD-10-CM | POA: Diagnosis not present

## 2020-10-06 DIAGNOSIS — N189 Chronic kidney disease, unspecified: Secondary | ICD-10-CM | POA: Diagnosis not present

## 2020-10-06 DIAGNOSIS — E7800 Pure hypercholesterolemia, unspecified: Secondary | ICD-10-CM | POA: Diagnosis not present

## 2020-10-14 ENCOUNTER — Ambulatory Visit (INDEPENDENT_AMBULATORY_CARE_PROVIDER_SITE_OTHER): Payer: Medicare HMO | Admitting: Cardiology

## 2020-10-14 ENCOUNTER — Encounter: Payer: Self-pay | Admitting: Cardiology

## 2020-10-14 ENCOUNTER — Encounter: Payer: Self-pay | Admitting: *Deleted

## 2020-10-14 VITALS — BP 118/64 | HR 87 | Ht 73.0 in | Wt 220.0 lb

## 2020-10-14 DIAGNOSIS — E782 Mixed hyperlipidemia: Secondary | ICD-10-CM | POA: Diagnosis not present

## 2020-10-14 DIAGNOSIS — I350 Nonrheumatic aortic (valve) stenosis: Secondary | ICD-10-CM | POA: Diagnosis not present

## 2020-10-14 NOTE — Patient Instructions (Signed)
Your physician recommends that you schedule a follow-up appointment in: 6 MONTHS WITH DR BRANCH ° °Your physician recommends that you continue on your current medications as directed. Please refer to the Current Medication list given to you today. ° °Your physician has requested that you have an echocardiogram. Echocardiography is a painless test that uses sound waves to create images of your heart. It provides your doctor with information about the size and shape of your heart and how well your heart’s chambers and valves are working. This procedure takes approximately one hour. There are no restrictions for this procedure. ° °Thank you for choosing [facility]!! ° ° °

## 2020-10-14 NOTE — Progress Notes (Signed)
Clinical Summary Mr. Barbary is a 80 y.o.male seen today for follow up of the following medical problems.  1. Aortic stenosis - echo 09/2016 mean grad 30, AVA VTI 0.83, VTI dimensionless index 0.24. Overall moderate to severe AS.  07/2017 echo: LVEF 55-60%, mod AS, mod AI. AS mean grad 22, AVA VTI 1.3, dimensionless index 0.32  08/2018 echo LVEF 55-60%, grade I diastolic dysfunction, moderate AS AVA VTI 1.2 AV mean 20    09/2019 echo LVEF 60-65%, grade I DDx. Moderate AS (mean grad 15, AVA VTI 0.9, DI 0.51. LVOT Diam 1.5) - the difference from 08/2018 appears to be a smaller LVOT diameter used.   - no SOB/DOE, no exertional chest pain       2. Hyperlipidemia - labs followed by pcp  compliant with statin    Has had covid vaccine   Wife passed recently 02/2020  Past Medical History:  Diagnosis Date  . Diabetes mellitus without complication (HCC)   . Neuropathy      No Known Allergies   Current Outpatient Medications  Medication Sig Dispense Refill  . aspirin EC 81 MG tablet Take 1 tablet (81 mg total) by mouth daily. 90 tablet 3  . atorvastatin (LIPITOR) 10 MG tablet Take 10 mg by mouth daily.    . Exenatide ER (BYDUREON) 2 MG PEN Inject into the skin once a week.    Marland Kitchen glimepiride (AMARYL) 2 MG tablet Take 2 mg by mouth daily with breakfast.    . Multiple Vitamins-Minerals (CENTRUM SILVER PO) Take by mouth.    . Omega-3 Fatty Acids (FISH OIL) 1360 MG CAPS Take 1 capsule by mouth daily.    . pantoprazole (PROTONIX) 40 MG tablet Take 40 mg by mouth daily.    . pioglitazone (ACTOS) 30 MG tablet Take 30 mg by mouth daily.     No current facility-administered medications for this visit.     Past Surgical History:  Procedure Laterality Date  . TOTAL KNEE ARTHROPLASTY       No Known Allergies    Family History  Problem Relation Age of Onset  . Heart Problems Father   . Heart attack Sister      Social History Mr. Coll reports that  he has been smoking. He has been smoking about 0.10 packs per day. He has never used smokeless tobacco. Mr. Dunklee reports no history of alcohol use.   Review of Systems CONSTITUTIONAL: No weight loss, fever, chills, weakness or fatigue.  HEENT: Eyes: No visual loss, blurred vision, double vision or yellow sclerae.No hearing loss, sneezing, congestion, runny nose or sore throat.  SKIN: No rash or itching.  CARDIOVASCULAR: per hpi RESPIRATORY: No shortness of breath, cough or sputum.  GASTROINTESTINAL: No anorexia, nausea, vomiting or diarrhea. No abdominal pain or blood.  GENITOURINARY: No burning on urination, no polyuria NEUROLOGICAL: No headache, dizziness, syncope, paralysis, ataxia, numbness or tingling in the extremities. No change in bowel or bladder control.  MUSCULOSKELETAL: No muscle, back pain, joint pain or stiffness.  LYMPHATICS: No enlarged nodes. No history of splenectomy.  PSYCHIATRIC: No history of depression or anxiety.  ENDOCRINOLOGIC: No reports of sweating, cold or heat intolerance. No polyuria or polydipsia.  Marland Kitchen   Physical Examination Today's Vitals   10/14/20 1424  BP: 118/64  Pulse: 87  SpO2: 90%  Weight: 220 lb (99.8 kg)  Height: 6\' 1"  (1.854 m)   Body mass index is 29.03 kg/m.  Gen: resting comfortably, no acute distress HEENT: no scleral  icterus, pupils equal round and reactive, no palptable cervical adenopathy,  CV: RRR, 3/6 systolic murmur rusb, no jvd Resp: Clear to auscultation bilaterally GI: abdomen is soft, non-tender, non-distended, normal bowel sounds, no hepatosplenomegaly MSK: extremities are warm, no edema.  Skin: warm, no rash Neuro:  no focal deficits Psych: appropriate affect   Diagnostic Studies  09/2016 echo Study Conclusions  - Left ventricle: The cavity size was normal. Wall thickness was normal. Systolic function was at the lower limits of normal. The estimated ejection fraction was in the range of 50% to  55%. Doppler parameters are consistent with abnormal left ventricular relaxation (grade 1 diastolic dysfunction). Doppler parameters are consistent with high ventricular filling pressure. - Regional wall motion abnormality: Mild hypokinesis of the basal-mid inferior and mid inferolateral myocardium. - Aortic valve: Moderately to severely calcified annulus. Moderately thickened, moderately calcified leaflets. There was moderate to severe stenosis. There was mild to moderate regurgitation. Peak velocity (S): 377 cm/s. Mean gradient (S): 30 mm Hg. Valve area (VTI): 0.83 cm^2. Valve area (Vmax): 0.78 cm^2. Valve area (Vmean): 0.8 cm^2. - Aorta: Mild aortic root dilatation. Aortic root dimension: 40 mm (ED). - Mitral valve: Moderately to severely calcified annulus. - Left atrium: The atrium was severely dilated.   07/2017 echo  Study Conclusions  - Left ventricle: The cavity size was normal. Wall thickness was normal. Systolic function was normal. The estimated ejection fraction was in the range of 55% to 60%. Wall motion was normal; there were no regional wall motion abnormalities. Doppler parameters are consistent with abnormal left ventricular relaxation (grade 1 diastolic dysfunction). Doppler parameters are consistent with high ventricular filling pressure. - Aortic valve: Moderately to severely calcified annulus. Trileaflet; moderately calcified leaflets. There was moderate stenosis. There was moderate regurgitation. Peak velocity (S): 323 cm/s. Mean gradient (S): 22 mm Hg. Valve area (VTI): 1.3 cm^2. Valve area (Vmax): 1.1 cm^2. Valve area (Vmean): 1.09 cm^2. - Mitral valve: Calcified annulus.  08/2018 echo IMPRESSIONS   1. The left ventricle has normal systolic function, with an ejection fraction of 55-60%. The cavity size was normal. There is mildly increased left ventricular wall thickness. Left ventricular diastolic Doppler  parameters are consistent with impaired  relaxation. Possible basal inferolateral hypokinesis . 2. The right ventricle has normal systolic function. The cavity was normal. There is no increase in right ventricular wall thickness. Right ventricular systolic pressure normal with an estimated pressure of 9.2 mmHg. 3. The aortic valve is functionally bicuspid. Moderate calcification of the aortic valve. Aortic valve regurgitation is mild by color flow Doppler. Moderate stenosis of the aortic valve. LVOT/AV VTI 0.37 and AVA approximately 1.2 cm2. Mild aortic  annular calcification noted. 4. The mitral valve is degenerative. Mild thickening of the mitral valve leaflet. There is moderate mitral annular calcification present. 5. The tricuspid valve is grossly normal.  09/2019 echo 1. Left ventricular ejection fraction, by estimation, is 60 to 65%. The  left ventricle has normal function. The left ventricle has no regional  wall motion abnormalities. There is severe concentric left ventricular  hypertrophy. Left ventricular diastolic  parameters are consistent with Grade I diastolic dysfunction (impaired  relaxation). Elevated left ventricular end-diastolic pressure.  2. Right ventricular systolic function is low normal. The right  ventricular size is normal.  3. The mitral valve is degenerative. No evidence of mitral valve  regurgitation. Mild mitral stenosis.  4. Aortic valve leaflets were poorly visualized. Overall, aortic stenosis  appeared moderate in severity.. The aortic valve has an indeterminant  number of cusps. Aortic valve regurgitation is mild. Moderate aortic valve  stenosis.     Assessment and Plan   1. Aortic stenosis -moderate AS, last echo affected my lower measured LVOT - repeat echo for ongoing surveillance - no symptoms.   2. Hyperlipidemia -request pcp labs, continue statin    Antoine Poche, M.D.

## 2020-10-20 DIAGNOSIS — N189 Chronic kidney disease, unspecified: Secondary | ICD-10-CM | POA: Diagnosis not present

## 2020-11-05 DIAGNOSIS — E7800 Pure hypercholesterolemia, unspecified: Secondary | ICD-10-CM | POA: Diagnosis not present

## 2020-11-05 DIAGNOSIS — I38 Endocarditis, valve unspecified: Secondary | ICD-10-CM | POA: Diagnosis not present

## 2020-11-05 DIAGNOSIS — E1169 Type 2 diabetes mellitus with other specified complication: Secondary | ICD-10-CM | POA: Diagnosis not present

## 2020-11-05 DIAGNOSIS — Z6829 Body mass index (BMI) 29.0-29.9, adult: Secondary | ICD-10-CM | POA: Diagnosis not present

## 2020-11-05 DIAGNOSIS — Z6830 Body mass index (BMI) 30.0-30.9, adult: Secondary | ICD-10-CM | POA: Diagnosis not present

## 2020-11-05 DIAGNOSIS — N1832 Chronic kidney disease, stage 3b: Secondary | ICD-10-CM | POA: Diagnosis not present

## 2020-11-05 DIAGNOSIS — K219 Gastro-esophageal reflux disease without esophagitis: Secondary | ICD-10-CM | POA: Diagnosis not present

## 2020-11-05 DIAGNOSIS — R69 Illness, unspecified: Secondary | ICD-10-CM | POA: Diagnosis not present

## 2020-11-18 DIAGNOSIS — Z86010 Personal history of colonic polyps: Secondary | ICD-10-CM | POA: Insufficient documentation

## 2020-11-19 ENCOUNTER — Other Ambulatory Visit: Payer: Medicare HMO

## 2020-12-17 ENCOUNTER — Other Ambulatory Visit: Payer: Self-pay | Admitting: Orthopaedic Surgery

## 2020-12-17 DIAGNOSIS — M5441 Lumbago with sciatica, right side: Secondary | ICD-10-CM

## 2020-12-17 DIAGNOSIS — M79605 Pain in left leg: Secondary | ICD-10-CM

## 2020-12-24 ENCOUNTER — Ambulatory Visit (INDEPENDENT_AMBULATORY_CARE_PROVIDER_SITE_OTHER): Payer: Medicare HMO

## 2020-12-24 DIAGNOSIS — I350 Nonrheumatic aortic (valve) stenosis: Secondary | ICD-10-CM

## 2020-12-24 LAB — ECHOCARDIOGRAM COMPLETE
AR max vel: 0.98 cm2
AV Area VTI: 0.94 cm2
AV Area mean vel: 0.93 cm2
AV Mean grad: 22.3 mmHg
AV Peak grad: 38.6 mmHg
AV Vena cont: 0.37 cm
Ao pk vel: 3.11 m/s
Area-P 1/2: 2.76 cm2
Calc EF: 56.2 %
P 1/2 time: 592 msec
S' Lateral: 3.13 cm
Single Plane A2C EF: 50 %
Single Plane A4C EF: 59.4 %

## 2020-12-29 ENCOUNTER — Encounter: Payer: Self-pay | Admitting: Cardiology

## 2020-12-29 DIAGNOSIS — N1832 Chronic kidney disease, stage 3b: Secondary | ICD-10-CM | POA: Diagnosis not present

## 2020-12-29 DIAGNOSIS — E7800 Pure hypercholesterolemia, unspecified: Secondary | ICD-10-CM | POA: Diagnosis not present

## 2020-12-29 DIAGNOSIS — Z6829 Body mass index (BMI) 29.0-29.9, adult: Secondary | ICD-10-CM | POA: Diagnosis not present

## 2020-12-29 DIAGNOSIS — E1169 Type 2 diabetes mellitus with other specified complication: Secondary | ICD-10-CM | POA: Diagnosis not present

## 2020-12-29 DIAGNOSIS — K219 Gastro-esophageal reflux disease without esophagitis: Secondary | ICD-10-CM | POA: Diagnosis not present

## 2020-12-29 DIAGNOSIS — I38 Endocarditis, valve unspecified: Secondary | ICD-10-CM | POA: Diagnosis not present

## 2020-12-29 DIAGNOSIS — R69 Illness, unspecified: Secondary | ICD-10-CM | POA: Diagnosis not present

## 2020-12-31 DIAGNOSIS — Z79899 Other long term (current) drug therapy: Secondary | ICD-10-CM | POA: Diagnosis not present

## 2020-12-31 DIAGNOSIS — K5730 Diverticulosis of large intestine without perforation or abscess without bleeding: Secondary | ICD-10-CM | POA: Diagnosis not present

## 2020-12-31 DIAGNOSIS — Z7982 Long term (current) use of aspirin: Secondary | ICD-10-CM | POA: Diagnosis not present

## 2020-12-31 DIAGNOSIS — E1122 Type 2 diabetes mellitus with diabetic chronic kidney disease: Secondary | ICD-10-CM | POA: Diagnosis not present

## 2020-12-31 DIAGNOSIS — E7800 Pure hypercholesterolemia, unspecified: Secondary | ICD-10-CM | POA: Diagnosis not present

## 2020-12-31 DIAGNOSIS — N189 Chronic kidney disease, unspecified: Secondary | ICD-10-CM | POA: Diagnosis not present

## 2020-12-31 DIAGNOSIS — R69 Illness, unspecified: Secondary | ICD-10-CM | POA: Diagnosis not present

## 2020-12-31 DIAGNOSIS — Z1211 Encounter for screening for malignant neoplasm of colon: Secondary | ICD-10-CM | POA: Diagnosis not present

## 2020-12-31 DIAGNOSIS — I080 Rheumatic disorders of both mitral and aortic valves: Secondary | ICD-10-CM | POA: Diagnosis not present

## 2020-12-31 DIAGNOSIS — Z86010 Personal history of colonic polyps: Secondary | ICD-10-CM | POA: Diagnosis not present

## 2020-12-31 DIAGNOSIS — Z7984 Long term (current) use of oral hypoglycemic drugs: Secondary | ICD-10-CM | POA: Diagnosis not present

## 2021-01-05 ENCOUNTER — Telehealth: Payer: Self-pay

## 2021-01-05 NOTE — Telephone Encounter (Signed)
Pt notified and verbalized understanding. Pt did not have any questions or concerns at this moment.

## 2021-01-05 NOTE — Telephone Encounter (Signed)
-----   Message from Antoine Poche, MD sent at 01/05/2021  9:57 AM EDT ----- Heart valve remains just moderately stiffened, we will continue to monitor at this time   Dominga Ferry MD

## 2021-01-20 DIAGNOSIS — I38 Endocarditis, valve unspecified: Secondary | ICD-10-CM | POA: Diagnosis not present

## 2021-01-20 DIAGNOSIS — N1832 Chronic kidney disease, stage 3b: Secondary | ICD-10-CM | POA: Diagnosis not present

## 2021-01-20 DIAGNOSIS — R69 Illness, unspecified: Secondary | ICD-10-CM | POA: Diagnosis not present

## 2021-01-20 DIAGNOSIS — Z6829 Body mass index (BMI) 29.0-29.9, adult: Secondary | ICD-10-CM | POA: Diagnosis not present

## 2021-01-20 DIAGNOSIS — E1169 Type 2 diabetes mellitus with other specified complication: Secondary | ICD-10-CM | POA: Diagnosis not present

## 2021-01-20 DIAGNOSIS — K219 Gastro-esophageal reflux disease without esophagitis: Secondary | ICD-10-CM | POA: Diagnosis not present

## 2021-01-27 DIAGNOSIS — N183 Chronic kidney disease, stage 3 unspecified: Secondary | ICD-10-CM | POA: Diagnosis not present

## 2021-02-19 DIAGNOSIS — Z8669 Personal history of other diseases of the nervous system and sense organs: Secondary | ICD-10-CM | POA: Diagnosis not present

## 2021-02-19 DIAGNOSIS — Z8739 Personal history of other diseases of the musculoskeletal system and connective tissue: Secondary | ICD-10-CM | POA: Diagnosis not present

## 2021-02-19 DIAGNOSIS — M79661 Pain in right lower leg: Secondary | ICD-10-CM | POA: Diagnosis not present

## 2021-02-19 DIAGNOSIS — M545 Low back pain, unspecified: Secondary | ICD-10-CM | POA: Diagnosis not present

## 2021-02-24 ENCOUNTER — Ambulatory Visit (INDEPENDENT_AMBULATORY_CARE_PROVIDER_SITE_OTHER): Payer: Medicare HMO | Admitting: Orthopaedic Surgery

## 2021-02-24 ENCOUNTER — Other Ambulatory Visit (INDEPENDENT_AMBULATORY_CARE_PROVIDER_SITE_OTHER): Payer: Medicare HMO

## 2021-02-24 ENCOUNTER — Other Ambulatory Visit: Payer: Self-pay

## 2021-02-24 ENCOUNTER — Encounter: Payer: Self-pay | Admitting: Orthopaedic Surgery

## 2021-02-24 DIAGNOSIS — G8929 Other chronic pain: Secondary | ICD-10-CM | POA: Diagnosis not present

## 2021-02-24 DIAGNOSIS — M5442 Lumbago with sciatica, left side: Secondary | ICD-10-CM

## 2021-02-24 DIAGNOSIS — M5441 Lumbago with sciatica, right side: Secondary | ICD-10-CM | POA: Diagnosis not present

## 2021-02-24 DIAGNOSIS — M79605 Pain in left leg: Secondary | ICD-10-CM | POA: Diagnosis not present

## 2021-02-24 NOTE — Progress Notes (Signed)
Office Visit Note   Patient: Tristan Brooks           Date of Birth: October 07, 1940           MRN: [id number] Visit Date: 02/24/2021              Requested by: [facility] [address] [place],  [place] [address] PCP: [facility]   Assessment & Plan: Visit Diagnoses:  1. Pain in left leg   2. Chronic bilateral low back pain with bilateral sciatica     Plan: Mr. Cislo has been experiencing bilateral leg pain for at least a month.  He denies any history of injury or trauma.  He has had some back pain but notes that it is mostly "in my legs".  He seems to be fine sitting in has trouble when he sleeps or when he stands.  He he also relates is not having too much trouble when he walks.  He is having more trouble on the left than he is on the right.  He has had some numbness and tingling.  Does have considerable degenerative change in the lumbosacral spine films and there is a possibility of spinal stenosis.  I also identified what may be aneurysmal dilatation of the aorta.  I am going to order an MRI scan of his lumbar spine and ultrasound of the aorta Follow-Up Instructions: Return After MRI scan lumbar spine and ultrasound of aorta.   Orders:  Orders Placed This Encounter  Procedures   XR Lumbar Spine 2-3 Views   MR Lumbar Spine w/o contrast   US AORTA   No orders of the defined types were placed in this encounter.     Procedures: No procedures performed   Clinical Data: No additional findings.   Subjective: Chief Complaint  Patient presents with   Left Leg - Pain  Patient presents today for left leg pain. He states that it started to hurt about a month ago, with no known injury. His pain run the length of his leg, along with numbness and tingling. No lower back or groin pain. He takes Tylenol as needed. He states that his pain is worse with rest.   HPI  Review of Systems   Objective: Vital Signs: There were no vitals taken for this  visit.  Physical Exam Constitutional:      Appearance: He is well-developed.  Pulmonary:     Effort: Pulmonary effort is normal.  Skin:    General: Skin is warm and dry.  Neurological:     Mental Status: He is alert and oriented to person, place, and time.  Psychiatric:        Behavior: Behavior normal.    Ortho Exam awake alert and oriented x3, comfortable sitting.  Straight leg raise negative.  No pain with internal extra rotation of either hip or loss of motion of the right compared to left hip.  Feet were warm.  Good capillary refill.  Minimal pulses both feet. no percussible tenderness of lumbar spine.  Sensory exam appears to be intact  Specialty Comments:  No specialty comments available.  Imaging: XR Lumbar Spine 2-3 Views  Result Date: 02/24/2021 Films lumbar spine were obtained in 2 projections.  There is a very slight 5 degree degenerative scoliosis to the right.  There is near collapse of the L3-4 disc space with peripheral osteophytes both anteriorly and posteriorly.  No obvious compression fracture.  Slight anterior listhesis of L4 on 5.  There  are degenerative disc changes at L5-S1.  Diffuse calcification of the abdominal aorta with a possible aneurysmal dilatation around L1 -L2.  Films are consistent with advanced osteoarthritis.    PMFS History: Patient Active Problem List   Diagnosis Date Noted   Bilateral primary osteoarthritis of knee 01/15/2020   Low back pain 10/23/2019   Past Medical History:  Diagnosis Date   Diabetes mellitus without complication (HCC)    Neuropathy     Family History  Problem Relation Age of Onset   Heart Problems Father    Heart attack Sister     Past Surgical History:  Procedure Laterality Date   TOTAL KNEE ARTHROPLASTY     Social History   Occupational History   Not on file  Tobacco Use   Smoking status: Some Days    Packs/day: 0.10    Types: Cigarettes   Smokeless tobacco: Never   Tobacco comments:    4-5 ciggs per  day  Substance and Sexual Activity   Alcohol use: No   Drug use: Not on file   Sexual activity: Not on file

## 2021-03-01 DIAGNOSIS — E1169 Type 2 diabetes mellitus with other specified complication: Secondary | ICD-10-CM | POA: Diagnosis not present

## 2021-03-01 DIAGNOSIS — R69 Illness, unspecified: Secondary | ICD-10-CM | POA: Diagnosis not present

## 2021-03-01 DIAGNOSIS — Z6829 Body mass index (BMI) 29.0-29.9, adult: Secondary | ICD-10-CM | POA: Diagnosis not present

## 2021-03-01 DIAGNOSIS — N1832 Chronic kidney disease, stage 3b: Secondary | ICD-10-CM | POA: Diagnosis not present

## 2021-03-01 DIAGNOSIS — E7801 Familial hypercholesterolemia: Secondary | ICD-10-CM | POA: Diagnosis not present

## 2021-03-01 DIAGNOSIS — K219 Gastro-esophageal reflux disease without esophagitis: Secondary | ICD-10-CM | POA: Diagnosis not present

## 2021-03-01 DIAGNOSIS — I38 Endocarditis, valve unspecified: Secondary | ICD-10-CM | POA: Diagnosis not present

## 2021-03-11 ENCOUNTER — Other Ambulatory Visit: Payer: Self-pay

## 2021-03-11 DIAGNOSIS — G8929 Other chronic pain: Secondary | ICD-10-CM

## 2021-03-22 DIAGNOSIS — M5442 Lumbago with sciatica, left side: Secondary | ICD-10-CM | POA: Diagnosis not present

## 2021-03-22 DIAGNOSIS — M5441 Lumbago with sciatica, right side: Secondary | ICD-10-CM | POA: Diagnosis not present

## 2021-03-22 DIAGNOSIS — G8929 Other chronic pain: Secondary | ICD-10-CM | POA: Diagnosis not present

## 2021-03-25 DIAGNOSIS — G8929 Other chronic pain: Secondary | ICD-10-CM | POA: Diagnosis not present

## 2021-03-25 DIAGNOSIS — R69 Illness, unspecified: Secondary | ICD-10-CM | POA: Diagnosis not present

## 2021-03-25 DIAGNOSIS — Z87891 Personal history of nicotine dependence: Secondary | ICD-10-CM | POA: Diagnosis not present

## 2021-03-25 DIAGNOSIS — Z833 Family history of diabetes mellitus: Secondary | ICD-10-CM | POA: Diagnosis not present

## 2021-03-25 DIAGNOSIS — R030 Elevated blood-pressure reading, without diagnosis of hypertension: Secondary | ICD-10-CM | POA: Diagnosis not present

## 2021-03-25 DIAGNOSIS — E1142 Type 2 diabetes mellitus with diabetic polyneuropathy: Secondary | ICD-10-CM | POA: Diagnosis not present

## 2021-03-25 DIAGNOSIS — Z8249 Family history of ischemic heart disease and other diseases of the circulatory system: Secondary | ICD-10-CM | POA: Diagnosis not present

## 2021-03-25 DIAGNOSIS — Z7984 Long term (current) use of oral hypoglycemic drugs: Secondary | ICD-10-CM | POA: Diagnosis not present

## 2021-03-25 DIAGNOSIS — M1990 Unspecified osteoarthritis, unspecified site: Secondary | ICD-10-CM | POA: Diagnosis not present

## 2021-03-25 DIAGNOSIS — K219 Gastro-esophageal reflux disease without esophagitis: Secondary | ICD-10-CM | POA: Diagnosis not present

## 2021-03-25 DIAGNOSIS — E785 Hyperlipidemia, unspecified: Secondary | ICD-10-CM | POA: Diagnosis not present

## 2021-03-25 DIAGNOSIS — Z823 Family history of stroke: Secondary | ICD-10-CM | POA: Diagnosis not present

## 2021-03-25 DIAGNOSIS — F3341 Major depressive disorder, recurrent, in partial remission: Secondary | ICD-10-CM | POA: Diagnosis not present

## 2021-03-30 DIAGNOSIS — Z23 Encounter for immunization: Secondary | ICD-10-CM | POA: Diagnosis not present

## 2021-03-30 DIAGNOSIS — E7800 Pure hypercholesterolemia, unspecified: Secondary | ICD-10-CM | POA: Diagnosis not present

## 2021-03-30 DIAGNOSIS — G8929 Other chronic pain: Secondary | ICD-10-CM | POA: Diagnosis not present

## 2021-03-30 DIAGNOSIS — Z6829 Body mass index (BMI) 29.0-29.9, adult: Secondary | ICD-10-CM | POA: Diagnosis not present

## 2021-03-30 DIAGNOSIS — R69 Illness, unspecified: Secondary | ICD-10-CM | POA: Diagnosis not present

## 2021-03-30 DIAGNOSIS — N1832 Chronic kidney disease, stage 3b: Secondary | ICD-10-CM | POA: Diagnosis not present

## 2021-03-30 DIAGNOSIS — K219 Gastro-esophageal reflux disease without esophagitis: Secondary | ICD-10-CM | POA: Diagnosis not present

## 2021-03-30 DIAGNOSIS — M5442 Lumbago with sciatica, left side: Secondary | ICD-10-CM | POA: Diagnosis not present

## 2021-03-30 DIAGNOSIS — M5441 Lumbago with sciatica, right side: Secondary | ICD-10-CM | POA: Diagnosis not present

## 2021-03-30 DIAGNOSIS — E1169 Type 2 diabetes mellitus with other specified complication: Secondary | ICD-10-CM | POA: Diagnosis not present

## 2021-03-30 DIAGNOSIS — I38 Endocarditis, valve unspecified: Secondary | ICD-10-CM | POA: Diagnosis not present

## 2021-04-05 DIAGNOSIS — N1832 Chronic kidney disease, stage 3b: Secondary | ICD-10-CM | POA: Diagnosis not present

## 2021-04-05 DIAGNOSIS — E119 Type 2 diabetes mellitus without complications: Secondary | ICD-10-CM | POA: Diagnosis not present

## 2021-04-05 DIAGNOSIS — I340 Nonrheumatic mitral (valve) insufficiency: Secondary | ICD-10-CM | POA: Diagnosis not present

## 2021-04-07 ENCOUNTER — Other Ambulatory Visit: Payer: Self-pay

## 2021-04-07 ENCOUNTER — Ambulatory Visit: Payer: Medicare HMO | Admitting: Orthopaedic Surgery

## 2021-04-19 ENCOUNTER — Ambulatory Visit: Payer: Medicare Other | Admitting: Cardiology

## 2021-04-22 DIAGNOSIS — N189 Chronic kidney disease, unspecified: Secondary | ICD-10-CM | POA: Diagnosis not present

## 2021-04-22 DIAGNOSIS — K219 Gastro-esophageal reflux disease without esophagitis: Secondary | ICD-10-CM | POA: Diagnosis not present

## 2021-04-28 DIAGNOSIS — R300 Dysuria: Secondary | ICD-10-CM | POA: Diagnosis not present

## 2021-05-04 DIAGNOSIS — J441 Chronic obstructive pulmonary disease with (acute) exacerbation: Secondary | ICD-10-CM | POA: Diagnosis not present

## 2021-05-04 DIAGNOSIS — F17200 Nicotine dependence, unspecified, uncomplicated: Secondary | ICD-10-CM | POA: Diagnosis not present

## 2021-05-04 DIAGNOSIS — R69 Illness, unspecified: Secondary | ICD-10-CM | POA: Diagnosis not present

## 2021-05-04 DIAGNOSIS — Z20828 Contact with and (suspected) exposure to other viral communicable diseases: Secondary | ICD-10-CM | POA: Diagnosis not present

## 2021-05-06 ENCOUNTER — Ambulatory Visit (INDEPENDENT_AMBULATORY_CARE_PROVIDER_SITE_OTHER): Payer: Medicare HMO | Admitting: Cardiology

## 2021-05-06 ENCOUNTER — Encounter: Payer: Self-pay | Admitting: *Deleted

## 2021-05-06 ENCOUNTER — Encounter: Payer: Self-pay | Admitting: Cardiology

## 2021-05-06 VITALS — BP 138/76 | HR 72 | Ht 73.0 in | Wt 214.2 lb

## 2021-05-06 DIAGNOSIS — I350 Nonrheumatic aortic (valve) stenosis: Secondary | ICD-10-CM | POA: Diagnosis not present

## 2021-05-06 DIAGNOSIS — E782 Mixed hyperlipidemia: Secondary | ICD-10-CM | POA: Diagnosis not present

## 2021-05-06 DIAGNOSIS — R931 Abnormal findings on diagnostic imaging of heart and coronary circulation: Secondary | ICD-10-CM

## 2021-05-06 NOTE — Patient Instructions (Signed)

## 2021-05-06 NOTE — Progress Notes (Signed)
Clinical Summary Tristan Brooks is a 80 y.o.male seen today for follow up of the following medical problems.    1. Aortic stenosis - echo 09/2016 mean grad 30, AVA VTI 0.83, VTI dimensionless index 0.24. Overall moderate to severe AS.   07/2017 echo: LVEF 55-60%, mod AS, mod AI. AS mean grad 22, AVA VTI 1.3, dimensionless index 0.32   08/2018 echo LVEF 55-60%, grade I diastolic dysfunction, moderate AS AVA VTI 1.2 AV mean 20       09/2019 echo LVEF 60-65%, grade I DDx. Moderate AS (mean grad 15, AVA VTI 0.9, DI 0.51. LVOT Diam 1.5) - the difference from 08/2018 appears to be a smaller LVOT diameter used.    - no SOB/DOE, no exertional chest pain      12/2020 echo LVEF 50-55%, hypokiensis inferior/inferolateral walls, grade II dd, AV mean grade 22, DI 0.37, AVA VTI 0.94    - no SOB/DOE, no chest pain        2. Hyperlipidemia - labs followed by pcp  - he is compliant with statin        3. CKD - last Cr was 2.08   Wife passed recently 02/2020   Past Medical History:  Diagnosis Date   Diabetes mellitus without complication (HCC)    Neuropathy      No Known Allergies   Current Outpatient Medications  Medication Sig Dispense Refill   aspirin EC 81 MG tablet Take 1 tablet (81 mg total) by mouth daily. 90 tablet 3   atorvastatin (LIPITOR) 20 MG tablet Take 20 mg by mouth daily.     Cholecalciferol (VITAMIN D-3) 25 MCG (1000 UT) CAPS Take by mouth.     Ferrous Sulfate Dried (HIGH POTENCY IRON) 65 MG TABS Take by mouth.     losartan (COZAAR) 25 MG tablet Take 12.5 mg by mouth daily.     minoxidil (LONITEN) 2.5 MG tablet Take 2.5 mg by mouth daily.     mirtazapine (REMERON) 15 MG tablet Take 15 mg by mouth at bedtime.     pantoprazole (PROTONIX) 40 MG tablet Take 40 mg by mouth daily.     pioglitazone (ACTOS) 30 MG tablet Take 30 mg by mouth daily.     No current facility-administered medications for this visit.     Past Surgical History:  Procedure Laterality Date    TOTAL KNEE ARTHROPLASTY       No Known Allergies    Family History  Problem Relation Age of Onset   Heart Problems Father    Heart attack Sister      Social History Tristan Brooks reports that he has been smoking. He has been smoking an average of .1 packs per day. He has never used smokeless tobacco. Tristan Brooks reports no history of alcohol use.   Review of Systems CONSTITUTIONAL: No weight loss, fever, chills, weakness or fatigue.  HEENT: Eyes: No visual loss, blurred vision, double vision or yellow sclerae.No hearing loss, sneezing, congestion, runny nose or sore throat.  SKIN: No rash or itching.  CARDIOVASCULAR: per hpi RESPIRATORY: No shortness of breath, cough or sputum.  GASTROINTESTINAL: No anorexia, nausea, vomiting or diarrhea. No abdominal pain or blood.  GENITOURINARY: No burning on urination, no polyuria NEUROLOGICAL: No headache, dizziness, syncope, paralysis, ataxia, numbness or tingling in the extremities. No change in bowel or bladder control.  MUSCULOSKELETAL: No muscle, back pain, joint pain or stiffness.  LYMPHATICS: No enlarged nodes. No history of splenectomy.  PSYCHIATRIC: No history of  depression or anxiety.  ENDOCRINOLOGIC: No reports of sweating, cold or heat intolerance. No polyuria or polydipsia.  Marland Kitchen   Physical Examination Today's Vitals   05/06/21 1155  BP: 138/76  Pulse: 72  SpO2: 97%  Weight: 214 lb 3.2 oz (97.2 kg)  Height: 6\' 1"  (1.854 m)   Body mass index is 28.26 kg/m.  Gen: resting comfortably, no acute distress HEENT: no scleral icterus, pupils equal round and reactive, no palptable cervical adenopathy,  CV: RRR, 2/6 systolic murmur rusb, no jvd Resp: Clear to auscultation bilaterally GI: abdomen is soft, non-tender, non-distended, normal bowel sounds, no hepatosplenomegaly MSK: extremities are warm, no edema.  Skin: warm, no rash Neuro:  no focal deficits Psych: appropriate affect   Diagnostic Studies 09/2016  echo Study Conclusions   - Left ventricle: The cavity size was normal. Wall thickness was   normal. Systolic function was at the lower limits of normal. The   estimated ejection fraction was in the range of 50% to 55%.   Doppler parameters are consistent with abnormal left ventricular   relaxation (grade 1 diastolic dysfunction). Doppler parameters   are consistent with high ventricular filling pressure. - Regional wall motion abnormality: Mild hypokinesis of the   basal-mid inferior and mid inferolateral myocardium. - Aortic valve: Moderately to severely calcified annulus.   Moderately thickened, moderately calcified leaflets. There was   moderate to severe stenosis. There was mild to moderate   regurgitation. Peak velocity (S): 377 cm/s. Mean gradient (S): 30   mm Hg. Valve area (VTI): 0.83 cm^2. Valve area (Vmax): 0.78 cm^2.   Valve area (Vmean): 0.8 cm^2. - Aorta: Mild aortic root dilatation. Aortic root dimension: 40 mm   (ED). - Mitral valve: Moderately to severely calcified annulus. - Left atrium: The atrium was severely dilated.     07/2017 echo   Study Conclusions   - Left ventricle: The cavity size was normal. Wall thickness was   normal. Systolic function was normal. The estimated ejection   fraction was in the range of 55% to 60%. Wall motion was normal;   there were no regional wall motion abnormalities. Doppler   parameters are consistent with abnormal left ventricular   relaxation (grade 1 diastolic dysfunction). Doppler parameters   are consistent with high ventricular filling pressure. - Aortic valve: Moderately to severely calcified annulus.   Trileaflet; moderately calcified leaflets. There was moderate   stenosis. There was moderate regurgitation. Peak velocity (S):   323 cm/s. Mean gradient (S): 22 mm Hg. Valve area (VTI): 1.3   cm^2. Valve area (Vmax): 1.1 cm^2. Valve area (Vmean): 1.09 cm^2. - Mitral valve: Calcified annulus.   08/2018 echo IMPRESSIONS       1. The left ventricle has normal systolic function, with an ejection fraction of 55-60%. The cavity size was normal. There is mildly increased left ventricular wall thickness. Left ventricular diastolic Doppler parameters are consistent with impaired  relaxation. Possible basal inferolateral hypokinesis .  2. The right ventricle has normal systolic function. The cavity was normal. There is no increase in right ventricular wall thickness. Right ventricular systolic pressure normal with an estimated pressure of 9.2 mmHg.  3. The aortic valve is functionally bicuspid. Moderate calcification of the aortic valve. Aortic valve regurgitation is mild by color flow Doppler. Moderate stenosis of the aortic valve. LVOT/AV VTI 0.37 and AVA approximately 1.2 cm2. Mild aortic  annular calcification noted.  4. The mitral valve is degenerative. Mild thickening of the mitral valve leaflet. There is moderate  mitral annular calcification present.  5. The tricuspid valve is grossly normal.   09/2019 echo 1. Left ventricular ejection fraction, by estimation, is 60 to 65%. The  left ventricle has normal function. The left ventricle has no regional  wall motion abnormalities. There is severe concentric left ventricular  hypertrophy. Left ventricular diastolic   parameters are consistent with Grade I diastolic dysfunction (impaired  relaxation). Elevated left ventricular end-diastolic pressure.   2. Right ventricular systolic function is low normal. The right  ventricular size is normal.   3. The mitral valve is degenerative. No evidence of mitral valve  regurgitation. Mild mitral stenosis.   4. Aortic valve leaflets were poorly visualized. Overall, aortic stenosis  appeared moderate in severity.. The aortic valve has an indeterminant  number of cusps. Aortic valve regurgitation is mild. Moderate aortic valve  stenosis.    12/2020 echo IMPRESSIONS     1. Hypokinesis of the inferolateral wall (base, mid),  inferior wall  (base).. Left ventricular ejection fraction, by estimation, is 50 to 55%.  The left ventricle has low normal function. The left ventricle has no  regional wall motion abnormalities. Left  ventricular diastolic parameters are consistent with Grade II diastolic  dysfunction (pseudonormalization). Elevated left atrial pressure.   2. Right ventricular systolic function is normal. The right ventricular  size is normal.   3. The mitral valve is abnormal. Trivial mitral valve regurgitation.   4. AV is not well visualized. Peak and mean gradients through the valve  are 38 and 22 mm Hg respectively Dimensionless index is 0.37. Overall  consistent with moderate AS. Compared to echo from 2021 Mean gradient is  increased (14 to 22 mm now) and  dimensionless index is decreased. . The aortic valve is abnormal. Aortic  valve regurgitation is mild to moderate.   5. The inferior vena cava is normal in size with greater than 50%  respiratory variability, suggesting right atrial pressure of 3 mmHg.     Assessment and Plan   1. Aortic stenosis - moderate AS, continue to monitor. No associated symptoms    2. Hyperlipidemia -continue statin, request pcp labs  3. Abnormal echo - most recent echo with relative decrease in LVEF but still WNL at 50-55%, reproted WMA inferolateral wall.  - no symptoms. With normal LVEF and advanced kidney dysfunction and absence of symptoms would just monitor at this time.    Arnoldo Lenis, M.D.

## 2021-05-19 ENCOUNTER — Encounter: Payer: Self-pay | Admitting: Orthopaedic Surgery

## 2021-05-19 ENCOUNTER — Ambulatory Visit (INDEPENDENT_AMBULATORY_CARE_PROVIDER_SITE_OTHER): Payer: Medicare HMO

## 2021-05-19 ENCOUNTER — Ambulatory Visit (INDEPENDENT_AMBULATORY_CARE_PROVIDER_SITE_OTHER): Payer: Medicare HMO | Admitting: Orthopaedic Surgery

## 2021-05-19 ENCOUNTER — Other Ambulatory Visit: Payer: Self-pay

## 2021-05-19 VITALS — Ht 73.0 in | Wt 215.0 lb

## 2021-05-19 DIAGNOSIS — M19049 Primary osteoarthritis, unspecified hand: Secondary | ICD-10-CM | POA: Diagnosis not present

## 2021-05-19 DIAGNOSIS — M25532 Pain in left wrist: Secondary | ICD-10-CM | POA: Diagnosis not present

## 2021-05-19 NOTE — Progress Notes (Signed)
Office Visit Note   Patient: Tristan Brooks           Date of Birth: 08-03-1940           MRN: [id number] Visit Date: 05/19/2021              Requested by: Practice, [address] [place],  [place] [address] PCP: [facility], [facility]  Chief Complaint  Patient presents with   Left Wrist - Pain      HPI: Patient is a pleasant 80 year old gentleman with a chief complaint of left wrist mass.  He is right-hand dominant.  He says he does have history of bilateral arthritis in his hands secondary to heavy piece of equipment falling on his hands over 30 years ago when he was working.  They do not really bother him he is right-hand dominant.  He does have numbness in both of his hands which was secondary to the injury as well.  He does not have any pain but he was seen by his primary care provider who was concerned as to what the mass might be.  Assessment & Plan: Visit Diagnoses:  1. Pain in left wrist     Plan: Advanced degenerative changes of the radiocarpal joint and thumb carpometacarpal joint.  Also cystic and arthritic changes in CMC joints.  He is not painful and is functional with without any difficulty.  Findings consistent with tenosynovitis of the extensor tendon causing cyst.  He also has contractures in the PIP joints.  Given his presentation would order a RA factor just to rule out an inflammatory arthropathy.  Discussed with him cyst could be aspirated will more than likely return.  Also steroid injection would increase his risk of tendon rupture in this area.  Patient is currently very functional and we will continue with observation for now.  We will contact him with the results of the RA factor  Follow-Up Instructions: No follow-ups on file.   Ortho Exam Patient appears well.  Comfortable sitting in a chair normal respiratory effort  patient is alert, oriented, no adenopathy, well-dressed, normal affect, normal respiratory effort. Bilateral hands he does  have some intrinsic atrophy.  Pulses are palpable.  He has fingers are warm with good capillary refill.  He is able to oppose all of his fingers.  He does have PIP contractures.  He has a large palpable cystic mass on the radial dorsal wrist.  This is not tender to palpation there is no warmth no erythema or cellulitis.  He does have some mild ulnar deviation of his hands  Imaging: XR Wrist Complete Left  Result Date: 05/19/2021 Radiographs of his left wrist were reviewed today.  He has advanced degenerative changes of the radial carpal joint.  He has ectopic calcifications consistent with CPPD.  He has sclerotic changes and cysts especially noted in the navicular bone.  He does have some mild ulnar deviation.  Also soft tissue swelling consistent with his cyst is noted over the thumb [facility] joint  No images are attached to the encounter.  Labs: Lab Results  Component Value Date   ESRSEDRATE 28 (H) 09/09/2014   CRP <0.5 (L) 09/09/2014   REPTSTATUS 09/11/2014 FINAL 09/09/2014   CULT NO GROWTH Performed at [facility]  09/09/2014     Lab Results  Component Value Date   ALBUMIN 4.1 11/25/2009    No results found for: MG No results found for: VD25OH  No results found for: PREALBUMIN CBC  EXTENDED Latest Ref Rng & Units 09/09/2014 12/04/2009 12/03/2009  WBC 4.0 - 10.5 K/uL 5.1 7.6 8.2  RBC 4.22 - 5.81 MIL/uL 3.80(L) 3.65(L) 3.41(L)  HGB 13.0 - 17.0 g/dL 10.9(L) 11.1(L) 10.4(L)  HCT 39.0 - 52.0 % 32.2(L) 33.1(L) 30.4(L)  PLT 150 - 400 K/uL 225 171 149 DELTA CHECK NOTED(L)  NEUTROABS 1.7 - 7.7 K/uL 2.7 - -  LYMPHSABS 0.7 - 4.0 K/uL 1.6 - -     Body mass index is 28.37 kg/m.  Orders:  Orders Placed This Encounter  Procedures   XR Wrist Complete Left   No orders of the defined types were placed in this encounter.    Procedures: No procedures performed  Clinical Data: No additional findings.  ROS:  All other systems negative, except as noted in the HPI. Review of  Systems  All other systems reviewed and are negative.  Objective: Vital Signs: Ht 6\' 1"  (1.854 m)   Wt 215 lb (97.5 kg)   BMI 28.37 kg/m   Specialty Comments:  No specialty comments available.  PMFS History: Patient Active Problem List   Diagnosis Date Noted   Bilateral primary osteoarthritis of knee 01/15/2020   Low back pain 10/23/2019   Past Medical History:  Diagnosis Date   Diabetes mellitus without complication (HCC)    Neuropathy     Family History  Problem Relation Age of Onset   Heart Problems Father    Heart attack Sister     Past Surgical History:  Procedure Laterality Date   TOTAL KNEE ARTHROPLASTY     Social History   Occupational History   Not on file  Tobacco Use   Smoking status: Some Days    Packs/day: 0.10    Types: Cigarettes   Smokeless tobacco: Never   Tobacco comments:    4-5 ciggs per day  Substance and Sexual Activity   Alcohol use: No   Drug use: Not on file   Sexual activity: Not on file

## 2021-05-27 DIAGNOSIS — Z6830 Body mass index (BMI) 30.0-30.9, adult: Secondary | ICD-10-CM | POA: Diagnosis not present

## 2021-05-27 DIAGNOSIS — K219 Gastro-esophageal reflux disease without esophagitis: Secondary | ICD-10-CM | POA: Diagnosis not present

## 2021-05-27 DIAGNOSIS — F17210 Nicotine dependence, cigarettes, uncomplicated: Secondary | ICD-10-CM | POA: Diagnosis not present

## 2021-05-27 DIAGNOSIS — N1832 Chronic kidney disease, stage 3b: Secondary | ICD-10-CM | POA: Diagnosis not present

## 2021-05-27 DIAGNOSIS — I38 Endocarditis, valve unspecified: Secondary | ICD-10-CM | POA: Diagnosis not present

## 2021-05-27 DIAGNOSIS — R69 Illness, unspecified: Secondary | ICD-10-CM | POA: Diagnosis not present

## 2021-05-27 DIAGNOSIS — E7849 Other hyperlipidemia: Secondary | ICD-10-CM | POA: Diagnosis not present

## 2021-05-27 DIAGNOSIS — E1169 Type 2 diabetes mellitus with other specified complication: Secondary | ICD-10-CM | POA: Diagnosis not present

## 2021-05-31 DIAGNOSIS — L720 Epidermal cyst: Secondary | ICD-10-CM | POA: Insufficient documentation

## 2021-06-08 DIAGNOSIS — L720 Epidermal cyst: Secondary | ICD-10-CM | POA: Diagnosis not present

## 2021-06-10 DIAGNOSIS — N189 Chronic kidney disease, unspecified: Secondary | ICD-10-CM | POA: Diagnosis not present

## 2021-06-10 DIAGNOSIS — E7800 Pure hypercholesterolemia, unspecified: Secondary | ICD-10-CM | POA: Diagnosis not present

## 2021-06-10 DIAGNOSIS — E1122 Type 2 diabetes mellitus with diabetic chronic kidney disease: Secondary | ICD-10-CM | POA: Diagnosis not present

## 2021-06-10 DIAGNOSIS — R69 Illness, unspecified: Secondary | ICD-10-CM | POA: Diagnosis not present

## 2021-06-10 DIAGNOSIS — Z7982 Long term (current) use of aspirin: Secondary | ICD-10-CM | POA: Diagnosis not present

## 2021-06-10 DIAGNOSIS — L720 Epidermal cyst: Secondary | ICD-10-CM | POA: Diagnosis not present

## 2021-06-30 DIAGNOSIS — L720 Epidermal cyst: Secondary | ICD-10-CM | POA: Diagnosis not present

## 2021-07-07 DIAGNOSIS — I38 Endocarditis, valve unspecified: Secondary | ICD-10-CM | POA: Diagnosis not present

## 2021-07-07 DIAGNOSIS — E7801 Familial hypercholesterolemia: Secondary | ICD-10-CM | POA: Diagnosis not present

## 2021-07-07 DIAGNOSIS — Z6830 Body mass index (BMI) 30.0-30.9, adult: Secondary | ICD-10-CM | POA: Diagnosis not present

## 2021-07-07 DIAGNOSIS — N1832 Chronic kidney disease, stage 3b: Secondary | ICD-10-CM | POA: Diagnosis not present

## 2021-07-07 DIAGNOSIS — K219 Gastro-esophageal reflux disease without esophagitis: Secondary | ICD-10-CM | POA: Diagnosis not present

## 2021-07-07 DIAGNOSIS — F17200 Nicotine dependence, unspecified, uncomplicated: Secondary | ICD-10-CM | POA: Diagnosis not present

## 2021-07-07 DIAGNOSIS — E1169 Type 2 diabetes mellitus with other specified complication: Secondary | ICD-10-CM | POA: Diagnosis not present

## 2021-07-07 DIAGNOSIS — R69 Illness, unspecified: Secondary | ICD-10-CM | POA: Diagnosis not present

## 2021-07-15 DIAGNOSIS — H35341 Macular cyst, hole, or pseudohole, right eye: Secondary | ICD-10-CM | POA: Diagnosis not present

## 2021-07-15 DIAGNOSIS — H401310 Pigmentary glaucoma, right eye, stage unspecified: Secondary | ICD-10-CM | POA: Diagnosis not present

## 2021-07-16 DIAGNOSIS — E7801 Familial hypercholesterolemia: Secondary | ICD-10-CM | POA: Diagnosis not present

## 2021-07-16 DIAGNOSIS — Z6830 Body mass index (BMI) 30.0-30.9, adult: Secondary | ICD-10-CM | POA: Diagnosis not present

## 2021-07-16 DIAGNOSIS — N1832 Chronic kidney disease, stage 3b: Secondary | ICD-10-CM | POA: Diagnosis not present

## 2021-07-16 DIAGNOSIS — R69 Illness, unspecified: Secondary | ICD-10-CM | POA: Diagnosis not present

## 2021-07-16 DIAGNOSIS — E1169 Type 2 diabetes mellitus with other specified complication: Secondary | ICD-10-CM | POA: Diagnosis not present

## 2021-07-16 DIAGNOSIS — I38 Endocarditis, valve unspecified: Secondary | ICD-10-CM | POA: Diagnosis not present

## 2021-07-16 DIAGNOSIS — K219 Gastro-esophageal reflux disease without esophagitis: Secondary | ICD-10-CM | POA: Diagnosis not present

## 2021-08-26 DIAGNOSIS — Z20822 Contact with and (suspected) exposure to covid-19: Secondary | ICD-10-CM | POA: Diagnosis not present

## 2021-10-06 DIAGNOSIS — N1832 Chronic kidney disease, stage 3b: Secondary | ICD-10-CM | POA: Diagnosis not present

## 2021-10-11 DIAGNOSIS — I10 Essential (primary) hypertension: Secondary | ICD-10-CM | POA: Diagnosis not present

## 2021-10-11 DIAGNOSIS — D649 Anemia, unspecified: Secondary | ICD-10-CM | POA: Diagnosis not present

## 2021-10-11 DIAGNOSIS — N1832 Chronic kidney disease, stage 3b: Secondary | ICD-10-CM | POA: Diagnosis not present

## 2021-10-11 DIAGNOSIS — I340 Nonrheumatic mitral (valve) insufficiency: Secondary | ICD-10-CM | POA: Diagnosis not present

## 2021-10-18 DIAGNOSIS — Z823 Family history of stroke: Secondary | ICD-10-CM | POA: Diagnosis not present

## 2021-10-18 DIAGNOSIS — G4700 Insomnia, unspecified: Secondary | ICD-10-CM | POA: Diagnosis not present

## 2021-10-18 DIAGNOSIS — E785 Hyperlipidemia, unspecified: Secondary | ICD-10-CM | POA: Diagnosis not present

## 2021-10-18 DIAGNOSIS — R69 Illness, unspecified: Secondary | ICD-10-CM | POA: Diagnosis not present

## 2021-10-18 DIAGNOSIS — K219 Gastro-esophageal reflux disease without esophagitis: Secondary | ICD-10-CM | POA: Diagnosis not present

## 2021-10-18 DIAGNOSIS — J449 Chronic obstructive pulmonary disease, unspecified: Secondary | ICD-10-CM | POA: Diagnosis not present

## 2021-10-18 DIAGNOSIS — Z7984 Long term (current) use of oral hypoglycemic drugs: Secondary | ICD-10-CM | POA: Diagnosis not present

## 2021-10-18 DIAGNOSIS — Z809 Family history of malignant neoplasm, unspecified: Secondary | ICD-10-CM | POA: Diagnosis not present

## 2021-10-18 DIAGNOSIS — R030 Elevated blood-pressure reading, without diagnosis of hypertension: Secondary | ICD-10-CM | POA: Diagnosis not present

## 2021-10-18 DIAGNOSIS — K5900 Constipation, unspecified: Secondary | ICD-10-CM | POA: Diagnosis not present

## 2021-10-18 DIAGNOSIS — E1151 Type 2 diabetes mellitus with diabetic peripheral angiopathy without gangrene: Secondary | ICD-10-CM | POA: Diagnosis not present

## 2021-11-04 DIAGNOSIS — K219 Gastro-esophageal reflux disease without esophagitis: Secondary | ICD-10-CM | POA: Diagnosis not present

## 2021-11-04 DIAGNOSIS — I38 Endocarditis, valve unspecified: Secondary | ICD-10-CM | POA: Diagnosis not present

## 2021-11-04 DIAGNOSIS — E7800 Pure hypercholesterolemia, unspecified: Secondary | ICD-10-CM | POA: Diagnosis not present

## 2021-11-04 DIAGNOSIS — E7801 Familial hypercholesterolemia: Secondary | ICD-10-CM | POA: Diagnosis not present

## 2021-11-04 DIAGNOSIS — N1832 Chronic kidney disease, stage 3b: Secondary | ICD-10-CM | POA: Diagnosis not present

## 2021-11-04 DIAGNOSIS — E1169 Type 2 diabetes mellitus with other specified complication: Secondary | ICD-10-CM | POA: Diagnosis not present

## 2021-11-09 DIAGNOSIS — R69 Illness, unspecified: Secondary | ICD-10-CM | POA: Diagnosis not present

## 2021-11-09 DIAGNOSIS — I38 Endocarditis, valve unspecified: Secondary | ICD-10-CM | POA: Diagnosis not present

## 2021-11-09 DIAGNOSIS — I10 Essential (primary) hypertension: Secondary | ICD-10-CM | POA: Diagnosis not present

## 2021-11-09 DIAGNOSIS — E1169 Type 2 diabetes mellitus with other specified complication: Secondary | ICD-10-CM | POA: Diagnosis not present

## 2021-11-09 DIAGNOSIS — N1832 Chronic kidney disease, stage 3b: Secondary | ICD-10-CM | POA: Diagnosis not present

## 2021-11-09 DIAGNOSIS — E7801 Familial hypercholesterolemia: Secondary | ICD-10-CM | POA: Diagnosis not present

## 2021-11-09 DIAGNOSIS — K219 Gastro-esophageal reflux disease without esophagitis: Secondary | ICD-10-CM | POA: Diagnosis not present

## 2021-11-09 DIAGNOSIS — Z6829 Body mass index (BMI) 29.0-29.9, adult: Secondary | ICD-10-CM | POA: Diagnosis not present

## 2021-11-22 ENCOUNTER — Emergency Department (HOSPITAL_COMMUNITY): Payer: Medicare HMO

## 2021-11-22 ENCOUNTER — Other Ambulatory Visit: Payer: Self-pay

## 2021-11-22 ENCOUNTER — Encounter (HOSPITAL_COMMUNITY): Payer: Self-pay | Admitting: *Deleted

## 2021-11-22 ENCOUNTER — Emergency Department (HOSPITAL_COMMUNITY)
Admission: EM | Admit: 2021-11-22 | Discharge: 2021-11-22 | Disposition: A | Discharge status: Home / Self Care | Payer: Medicare HMO | Attending: Emergency Medicine | Admitting: Emergency Medicine

## 2021-11-22 DIAGNOSIS — E7800 Pure hypercholesterolemia, unspecified: Secondary | ICD-10-CM | POA: Diagnosis not present

## 2021-11-22 DIAGNOSIS — E1122 Type 2 diabetes mellitus with diabetic chronic kidney disease: Secondary | ICD-10-CM | POA: Diagnosis not present

## 2021-11-22 DIAGNOSIS — Z7982 Long term (current) use of aspirin: Secondary | ICD-10-CM | POA: Diagnosis not present

## 2021-11-22 DIAGNOSIS — Z7984 Long term (current) use of oral hypoglycemic drugs: Secondary | ICD-10-CM | POA: Insufficient documentation

## 2021-11-22 DIAGNOSIS — M25562 Pain in left knee: Secondary | ICD-10-CM | POA: Insufficient documentation

## 2021-11-22 DIAGNOSIS — N189 Chronic kidney disease, unspecified: Secondary | ICD-10-CM | POA: Diagnosis not present

## 2021-11-22 DIAGNOSIS — Z743 Need for continuous supervision: Secondary | ICD-10-CM | POA: Diagnosis not present

## 2021-11-22 DIAGNOSIS — R52 Pain, unspecified: Secondary | ICD-10-CM | POA: Diagnosis not present

## 2021-11-22 DIAGNOSIS — Z79899 Other long term (current) drug therapy: Secondary | ICD-10-CM | POA: Diagnosis not present

## 2021-11-22 DIAGNOSIS — E119 Type 2 diabetes mellitus without complications: Secondary | ICD-10-CM | POA: Insufficient documentation

## 2021-11-22 DIAGNOSIS — R69 Illness, unspecified: Secondary | ICD-10-CM | POA: Diagnosis not present

## 2021-11-22 DIAGNOSIS — Z96652 Presence of left artificial knee joint: Secondary | ICD-10-CM | POA: Diagnosis not present

## 2021-11-22 DIAGNOSIS — M25569 Pain in unspecified knee: Secondary | ICD-10-CM | POA: Diagnosis not present

## 2021-11-22 DIAGNOSIS — M79605 Pain in left leg: Secondary | ICD-10-CM | POA: Diagnosis not present

## 2021-11-22 DIAGNOSIS — K219 Gastro-esophageal reflux disease without esophagitis: Secondary | ICD-10-CM | POA: Diagnosis not present

## 2021-11-22 DIAGNOSIS — R809 Proteinuria, unspecified: Secondary | ICD-10-CM | POA: Diagnosis not present

## 2021-11-22 DIAGNOSIS — I10 Essential (primary) hypertension: Secondary | ICD-10-CM | POA: Diagnosis not present

## 2021-11-22 MED ORDER — OXYCODONE-ACETAMINOPHEN 5-325 MG PO TABS: 1.0000 | ORAL_TABLET | Freq: Three times a day (TID) | ORAL | 0 refills | Status: AC | PRN

## 2021-11-22 MED ORDER — HYDROMORPHONE HCL 1 MG/ML IJ SOLN
0.5000 mg | Freq: Once | INTRAMUSCULAR | Status: AC
  Administered 2021-11-22: 0.5 mg via INTRAVENOUS
  Filled 2021-11-22: qty 0.5

## 2021-11-22 NOTE — ED Triage Notes (Addendum)
Pt with left knee pain off and on for past 2-3 years.  Pt with hx of left knee replacement. Pt seen at [facility] earlier today and was given pain medication there.  Pt still in pain and here for evaluation for a second opinion.  Received Hydrocodone and Morphine for pain at [facility] and took [facility] arthritis PTA here. Per d/c instructions pt is to follow up with orthopedist.

## 2021-11-22 NOTE — ED Provider Notes (Signed)
[facility]ERGENCY DEPARTMENT Provider Note   CSN: [id number] Arrival date & time: 11/22/21  1127     History  Chief Complaint  Patient presents with   Knee Pain    Tristan Brooks is a 81 y.o. male.  HPI  With medical history including diabetes, presents with complaints of left knee pain.  states he been having on and off knee pain for last 2 to 3 years, but since Friday he has been having worsening pain.  He states pain came on suddenly, then went away on its own, he went to church Saturday and pain came back and is remained constant, pain is all around his knee, does not radiate, worsened with motion improved with rest.  Denies any paresthesias moving down his leg denies any recent trauma to the area.  States he went to [facility] where they did not do anything and came here for reassessment.    Home Medications Prior to Admission medications   Medication Sig Start Date End Date Taking? Authorizing Provider  oxyCODONE-acetaminophen (PERCOCET/ROXICET) 5-325 MG tablet Take 1 tablet by mouth every 8 (eight) hours as needed for up to 3 days for severe pain. 11/22/21 11/25/21 Yes Carroll SageFaulkner, Gaberial Cada J, PA-C  aspirin EC 81 MG tablet Take 1 tablet (81 mg total) by mouth daily. 11/22/17   Antoine PocheBranch, Jonathan F, MD  atorvastatin (LIPITOR) 20 MG tablet Take 20 mg by mouth daily.    [provider]  cefdinir (OMNICEF) 300 MG capsule Take 300 mg by mouth daily. 04/28/21   [provider]  celecoxib (CELEBREX) 100 MG capsule Take 100 mg by mouth daily. 02/19/21   [provider]  Cholecalciferol (VITAMIN D-3) 25 MCG (1000 UT) CAPS Take by mouth.    [provider]  Ferrous Sulfate Dried (HIGH POTENCY IRON) 65 MG TABS Take by mouth.    [provider]  glipiZIDE (GLUCOTROL) 5 MG tablet Take 5 mg by mouth every morning. 04/09/21   [provider]  losartan (COZAAR) 25 MG tablet Take 12.5 mg by mouth daily.    [provider]  minoxidil  (LONITEN) 2.5 MG tablet Take 2.5 mg by mouth daily.    [provider]  mirtazapine (REMERON) 15 MG tablet Take 15 mg by mouth at bedtime.    [provider]  pantoprazole (PROTONIX) 40 MG tablet Take 40 mg by mouth daily.    [provider]  pioglitazone (ACTOS) 30 MG tablet Take 30 mg by mouth daily.    [provider]  predniSONE (DELTASONE) 10 MG tablet Take 40 mg by mouth daily. 05/04/21   [provider]      Allergies    Patient has no known allergies.    Review of Systems   Review of Systems  Constitutional:  Negative for chills and fever.  Respiratory:  Negative for shortness of breath.   Cardiovascular:  Negative for chest pain.  Gastrointestinal:  Negative for abdominal pain.  Musculoskeletal:        Left knee pain  Neurological:  Negative for headaches.   Physical Exam Updated Vital Signs BP (!) 172/74   Pulse 78   Temp (!) 97.5 F (36.4 C) (Oral)   Resp 18   Ht 6\' 1"  (1.854 m)   Wt 99.8 kg   SpO2 92%   BMI 29.03 kg/m  Physical Exam Vitals and nursing note reviewed.  Constitutional:      General: He is not in acute distress.    Appearance:  He is not ill-appearing.  HENT:     Head: Normocephalic and atraumatic.     Nose: No congestion.  Eyes:     Conjunctiva/sclera: Conjunctivae normal.  Cardiovascular:     Rate and Rhythm: Normal rate and regular rhythm.     Pulses: Normal pulses.     Heart sounds: No murmur heard.   No friction rub. No gallop.  Pulmonary:     Effort: No respiratory distress.     Breath sounds: No wheezing, rhonchi or rales.  Abdominal:     Palpations: Abdomen is soft.     Tenderness: There is no abdominal tenderness. There is no right CVA tenderness or left CVA tenderness.  Musculoskeletal:     Comments: No deformities of the left knee present, no erythema or edema present, full range of motion in his toes ankle and knee, no joint laxity, has nonfocal ice tenderness around the knee itself  with tenderness along the popliteal, no palpable cords or calf tenderness 2+ dorsal pedal pulses.    Skin:    General: Skin is warm and dry.  Neurological:     Mental Status: He is alert.  Psychiatric:        Mood and Affect: Mood normal.    ED Results / Procedures / Treatments   Labs (all labs ordered are listed, but only abnormal results are displayed) Labs Reviewed - No data to display  EKG None  Radiology CT Knee Left Wo Contrast  Result Date: 11/22/2021 CLINICAL DATA:  Knee trauma, occult fracture suspected. Patient complains of intermittent left knee pain for 2 years. EXAM: CT OF THE LEFT KNEE WITHOUT CONTRAST TECHNIQUE: Multidetector CT imaging of the left knee was performed according to the standard protocol. Multiplanar CT image reconstructions were also generated. RADIATION DOSE REDUCTION: This exam was performed according to the departmental dose-optimization program which includes automated exposure control, adjustment of the mA and/or kV according to patient size and/or use of iterative reconstruction technique. COMPARISON:  Radiograph performed earlier on the same date. FINDINGS: Bones/Joint/Cartilage Status post left knee arthroplasty. No perihardware fracture. No evidence of loosening. Small suprapatellar joint effusion. Ligaments Suboptimally assessed by CT. Muscles and Tendons Muscles are normal in bulk. No intramuscular fluid collection or hematoma. Prominent vascular Soft tissues Vascular calcification. Skin and subcutaneous soft tissues are within normal limits. IMPRESSION: Status post left knee arthroplasty with intact hardware. No evidence of perihardware loosening or fracture. Electronically Signed   By: Larose Hires D.O.   On: 11/22/2021 15:51   US Venous Img Lower Unilateral Left  Result Date: 11/22/2021 CLINICAL DATA:  Left leg pain over the last 3 years EXAM: LEFT LOWER EXTREMITY VENOUS DOPPLER ULTRASOUND TECHNIQUE: Gray-scale sonography with compression, as well  as color and duplex ultrasound, were performed to evaluate the deep venous system(s) from the level of the common femoral vein through the popliteal and proximal calf veins. COMPARISON:  None Available. FINDINGS: VENOUS Normal compressibility of the common femoral, superficial femoral, and popliteal veins, as well as the visualized calf veins. Visualized portions of profunda femoral vein and great saphenous vein unremarkable. No filling defects to suggest DVT on grayscale or color Doppler imaging. Doppler waveforms show normal direction of venous flow, normal respiratory plasticity and response to augmentation. Limited views of the contralateral common femoral vein are unremarkable. OTHER None. Limitations: none IMPRESSION: Negative. Electronically Signed   By: Gaylyn Rong M.D.   On: 11/22/2021 14:15   DG Knee Complete 4 Views Left  Result Date: 11/22/2021 CLINICAL  DATA:  Nonlocalized left knee pain. Remote knee replacement 8 years ago. EXAM: LEFT KNEE - COMPLETE 4+ VIEW COMPARISON:  09/09/2014 FINDINGS: Left total knee arthroplasty changes noted. Stable appearing hardware and alignment. No hardware or acute osseous abnormality. Negative for fracture. No effusion. Bones are osteopenic. Peripheral vascular calcifications. IMPRESSION: Left total knee arthroplasty. No acute abnormality by plain radiography. Electronically Signed   By: Judie Petit.  Shick M.D.   On: 11/22/2021 12:53    Procedures Procedures    Medications Ordered in ED Medications  HYDROmorphone (DILAUDID) injection 0.5 mg (0.5 mg Intravenous Given 11/22/21 1249)    ED Course/ Medical Decision Making/ A&P                           Medical Decision Making Amount and/or Complexity of Data Reviewed Radiology: ordered.  Risk Prescription drug management.   This patient presents to the ED for concern of left knee pain, this involves an extensive number of treatment options, and is a complaint that carries with it a high risk of  complications and morbidity.  The differential diagnosis includes fracture, dislocation, DVT    Additional history obtained:  Additional history obtained from son and daughter at bedside External records from outside source obtained and reviewed including previous ED note, medications, medical history   Co morbidities that complicate the patient evaluation  Diabetes  Social Determinants of Health:  Geriatric    Lab Tests:  I Ordered, and personally interpreted labs.  The pertinent results include: N/A   Imaging Studies ordered:  I ordered imaging studies including DVT study, x-ray of left knee I independently visualized and interpreted imaging which showed x-ray of left knee is negative for acute findings, DVT study negative, CT imaging negative acute findings. I agree with the radiologist interpretation   Cardiac Monitoring:  The patient was maintained on a cardiac monitor.  I personally viewed and interpreted the cardiac monitored which showed an underlying rhythm of: N/A   Medicines ordered and prescription drug management:  I ordered medication including Dilaudid for pain I have reviewed the patients home medicines and have made adjustments as needed  Critical Interventions:  N/A   Reevaluation:  Presents with left knee pain he had benign physical exam, will obtain imaging for rule out of fracture, as well as DVT study rule out of blood clot.  Provide medication reassess  Patient was updated on imaging, states he is feeling better after pain medication, attempted ambulate the patient was states he saw similar pain in his left knee, unclear etiology, I am concerned for possible occult fracture due to his age, will obtain CT imaging for further evaluation.  Reassessed update imaging, they agree with plan and discharge at this time.    Consultations Obtained:  N/A    considered:  Admission-we will defer for pain management as pain is under control at  this time he is able to ambulate with some assistance, he is agreement with discharge.    Rule out I have low suspicion for septic arthritis as patient denies IV drug use, skin exam was performed no erythematous, edematous, warm joints noted on exam, no new heart murmur heard on exam.  Low suspicion for fracture or dislocation as x-ray does not feel any significant findings. low suspicion for ligament or tendon damage as area was palpated no gross defects noted, they had full range of motion as well as 5/5 strength.  low suspicion for DVT as DVT study is  negative low suspicion for compartment syndrome as area was palpated it was soft to the touch, neurovascular fully intact.     Dispostion and problem list  After consideration of the diagnostic results and the patients response to treatment, I feel that the patent would benefit from discharge.  Knee pain-likely acute on chronic pain, will provide with pain medications, follow-up with orthopedics for reassessment.            Final Clinical Impression(s) / ED Diagnoses Final diagnoses:  Acute pain of left knee    Rx / DC Orders ED Discharge Orders          Ordered    oxyCODONE-acetaminophen (PERCOCET/ROXICET) 5-325 MG tablet  Every 8 hours PRN        11/22/21 1644              Barnie Del 11/22/21 1646    Gloris Manchester, MD 11/23/21 [phone]

## 2021-11-22 NOTE — Discharge Instructions (Signed)
Imaging was all reassuring, started you on some pain medication please take as prescribed.  May use over-the-counter pain medication as needed, recommend applying heat and ice for there to help with pain and inflammation.  I have given you a short course of narcotics please take as prescribed.  This medication can make you drowsy do not consume alcohol or operate heavy machinery when taking this medication.  This medication is Tylenol in it do not take Tylenol and take this medication.    Please follow-up with your orthopedic doctor for further evaluation.  Come back to the emergency department if you develop chest pain, shortness of breath, severe abdominal pain, uncontrolled nausea, vomiting, diarrhea.

## 2021-11-26 ENCOUNTER — Telehealth: Payer: Self-pay | Admitting: Cardiology

## 2021-11-26 DIAGNOSIS — M25562 Pain in left knee: Secondary | ICD-10-CM | POA: Diagnosis not present

## 2021-11-26 DIAGNOSIS — I10 Essential (primary) hypertension: Secondary | ICD-10-CM | POA: Diagnosis not present

## 2021-11-26 DIAGNOSIS — N1832 Chronic kidney disease, stage 3b: Secondary | ICD-10-CM | POA: Diagnosis not present

## 2021-11-26 DIAGNOSIS — K219 Gastro-esophageal reflux disease without esophagitis: Secondary | ICD-10-CM | POA: Diagnosis not present

## 2021-11-26 DIAGNOSIS — E1169 Type 2 diabetes mellitus with other specified complication: Secondary | ICD-10-CM | POA: Diagnosis not present

## 2021-11-26 DIAGNOSIS — I38 Endocarditis, valve unspecified: Secondary | ICD-10-CM | POA: Diagnosis not present

## 2021-11-26 DIAGNOSIS — E7801 Familial hypercholesterolemia: Secondary | ICD-10-CM | POA: Diagnosis not present

## 2021-11-26 DIAGNOSIS — R69 Illness, unspecified: Secondary | ICD-10-CM | POA: Diagnosis not present

## 2021-11-26 DIAGNOSIS — G8929 Other chronic pain: Secondary | ICD-10-CM | POA: Diagnosis not present

## 2021-11-26 DIAGNOSIS — Z6829 Body mass index (BMI) 29.0-29.9, adult: Secondary | ICD-10-CM | POA: Diagnosis not present

## 2021-11-26 NOTE — Telephone Encounter (Signed)
Patient is requesting an order for an echo. 

## 2021-11-29 NOTE — Telephone Encounter (Signed)
Call not going through - not available.

## 2021-11-30 NOTE — Telephone Encounter (Signed)
Left message to return call on home number.   Not available on mobile number.

## 2021-12-01 ENCOUNTER — Encounter: Payer: Self-pay | Admitting: Orthopaedic Surgery

## 2021-12-01 ENCOUNTER — Ambulatory Visit (INDEPENDENT_AMBULATORY_CARE_PROVIDER_SITE_OTHER): Payer: Medicare HMO | Admitting: Orthopaedic Surgery

## 2021-12-01 DIAGNOSIS — M25562 Pain in left knee: Secondary | ICD-10-CM | POA: Diagnosis not present

## 2021-12-01 NOTE — Progress Notes (Signed)
Office Visit Note   Patient: Tristan Brooks           Date of Birth: 03/01/1941           MRN: [id number] Visit Date: 12/01/2021              Requested by: [facility] [address] [place],  [place] [address] PCP: [facility]   Assessment & Plan: Visit Diagnoses:  1. Acute pain of left knee     Plan: Mr. Sisney relates having some discomfort on an occasional basis with his left total knee replacement for at least a year or 2.  He had an acute exacerbation approximately a week ago.  He was seen at [facility] and then at [facility].  Ultrasound was negative for DVT.  He had plain films and a CT scan of his left knee without any evidence of loosening of the total knee components.  He was placed on Dilaudid for pain.  He relates he came to the office to have it checked but presently is not having any discomfort.  He does have a history of low back pain and has had injections in the past.  He thinks it might of been actually related to his back.  He does use a cane and has for at least 6 to 7 months related to balance.  His exam today was completely benign.  I did review his films of his knee and I thought he had an excellent glue mantle and no evidence of loosening.  His knee was not hot red warm or swollen.  There was no effusion or evidence of instability.  Fortunately his problem has resolved.  It might very well have originated in his back but he is fine at the moment.  We will plan to see him back as needed  Follow-Up Instructions: Return if symptoms worsen or fail to improve.   Orders:  No orders of the defined types were placed in this encounter.  No orders of the defined types were placed in this encounter.     Procedures: No procedures performed   Clinical Data: No additional findings.   Subjective: Chief Complaint  Patient presents with   Left Knee - Pain    Been hurting for no reason now off and on for about 2 weeks. Went to [facility] ER  11/27/21 they were hurting so bad. They gave me some pain medicine and it helped   Right Knee - Pain    The pain radiates down into my legs some. I use Vicks salve and other rubs and they seem to help some. It is hard to walk when they hurt.  Recently seen at [facility] and [facility] on the same day for evaluation of his knee pain.  Diagnostic studies were negative for any acute problems.  He was placed on Dilaudid and relates that he is fine at the present time.  Has had a chronic history of back problems with the cortisone injections.  Prior films last year demonstrated significant degenerative changes at multiple levels.  Presently not having any numbness or tingling or pain into either lower extremity  HPI  Review of Systems   Objective: Vital Signs: Ht 6\' 1"  (1.854 m)   Wt 211 lb 8 oz (95.9 kg)   BMI 27.90 kg/m   Physical Exam Constitutional:      Appearance: He is well-developed.  Pulmonary:     Effort: Pulmonary effort is normal.  Skin:  General: Skin is warm and dry.  Neurological:     Mental Status: He is alert and oriented to person, place, and time.  Psychiatric:        Behavior: Behavior normal.     Ortho Exam awake alert and oriented x3.  Comfortable sitting.  Uses a cane to help with ambulation.  Straight leg raise negative.  Painless range of motion of both hips.  Left knee, specifically, was not hot warm or red.  No effusion.  Full quick extension.  No opening with varus or valgus stress and no localized areas of tenderness.  Motor exam appears to be intact.  No percussible tenderness of the lumbar spine  Specialty Comments:  No specialty comments available.  Imaging: No results found.   PMFS History: Patient Active Problem List   Diagnosis Date Noted   Pain in left knee 12/01/2021   Bilateral primary osteoarthritis of knee 01/15/2020   Low back pain 10/23/2019   Past Medical History:  Diagnosis Date   Diabetes mellitus without complication (HCC)     Neuropathy     Family History  Problem Relation Age of Onset   Heart Problems Father    Heart attack Sister     Past Surgical History:  Procedure Laterality Date   ROTATOR CUFF REPAIR Right    TOTAL KNEE ARTHROPLASTY     Social History   Occupational History   Not on file  Tobacco Use   Smoking status: Some Days    Packs/day: 0.10    Types: Cigarettes   Smokeless tobacco: Never   Tobacco comments:    4-5 ciggs per day  Substance and Sexual Activity   Alcohol use: No   Drug use: Not on file   Sexual activity: Not on file     Valeria Batman, MD   Note - This record has been created using Animal nutritionist.  Chart creation errors have been sought, but may not always  have been located. Such creation errors do not reflect on  the standard of medical care.

## 2021-12-03 NOTE — Telephone Encounter (Signed)
Call can not go through at this time.

## 2021-12-06 ENCOUNTER — Encounter: Payer: Self-pay | Admitting: *Deleted

## 2021-12-06 NOTE — Telephone Encounter (Signed)
Unable to reach, letter mailed today.

## 2021-12-08 DIAGNOSIS — R001 Bradycardia, unspecified: Secondary | ICD-10-CM | POA: Diagnosis not present

## 2021-12-08 DIAGNOSIS — J439 Emphysema, unspecified: Secondary | ICD-10-CM | POA: Diagnosis not present

## 2021-12-08 DIAGNOSIS — Z7982 Long term (current) use of aspirin: Secondary | ICD-10-CM | POA: Diagnosis not present

## 2021-12-08 DIAGNOSIS — R69 Illness, unspecified: Secondary | ICD-10-CM | POA: Diagnosis not present

## 2021-12-08 DIAGNOSIS — E7800 Pure hypercholesterolemia, unspecified: Secondary | ICD-10-CM | POA: Diagnosis not present

## 2021-12-08 DIAGNOSIS — N281 Cyst of kidney, acquired: Secondary | ICD-10-CM | POA: Diagnosis not present

## 2021-12-08 DIAGNOSIS — Z79899 Other long term (current) drug therapy: Secondary | ICD-10-CM | POA: Diagnosis not present

## 2021-12-08 DIAGNOSIS — J849 Interstitial pulmonary disease, unspecified: Secondary | ICD-10-CM | POA: Diagnosis not present

## 2021-12-08 DIAGNOSIS — Z7984 Long term (current) use of oral hypoglycemic drugs: Secondary | ICD-10-CM | POA: Diagnosis not present

## 2021-12-08 DIAGNOSIS — E785 Hyperlipidemia, unspecified: Secondary | ICD-10-CM | POA: Diagnosis not present

## 2021-12-08 DIAGNOSIS — Z743 Need for continuous supervision: Secondary | ICD-10-CM | POA: Diagnosis not present

## 2021-12-08 DIAGNOSIS — N179 Acute kidney failure, unspecified: Secondary | ICD-10-CM | POA: Diagnosis not present

## 2021-12-08 DIAGNOSIS — K219 Gastro-esophageal reflux disease without esophagitis: Secondary | ICD-10-CM | POA: Diagnosis not present

## 2021-12-08 DIAGNOSIS — J189 Pneumonia, unspecified organism: Secondary | ICD-10-CM | POA: Diagnosis not present

## 2021-12-08 DIAGNOSIS — R918 Other nonspecific abnormal finding of lung field: Secondary | ICD-10-CM | POA: Diagnosis not present

## 2021-12-08 DIAGNOSIS — F17210 Nicotine dependence, cigarettes, uncomplicated: Secondary | ICD-10-CM | POA: Diagnosis not present

## 2021-12-08 DIAGNOSIS — R0902 Hypoxemia: Secondary | ICD-10-CM | POA: Diagnosis not present

## 2021-12-08 DIAGNOSIS — K6389 Other specified diseases of intestine: Secondary | ICD-10-CM | POA: Diagnosis not present

## 2021-12-08 DIAGNOSIS — N183 Chronic kidney disease, stage 3 unspecified: Secondary | ICD-10-CM | POA: Diagnosis not present

## 2021-12-08 DIAGNOSIS — Z720 Tobacco use: Secondary | ICD-10-CM | POA: Diagnosis not present

## 2021-12-08 DIAGNOSIS — R55 Syncope and collapse: Secondary | ICD-10-CM | POA: Diagnosis not present

## 2021-12-08 DIAGNOSIS — J42 Unspecified chronic bronchitis: Secondary | ICD-10-CM | POA: Diagnosis not present

## 2021-12-08 DIAGNOSIS — Z792 Long term (current) use of antibiotics: Secondary | ICD-10-CM | POA: Diagnosis not present

## 2021-12-08 DIAGNOSIS — E1122 Type 2 diabetes mellitus with diabetic chronic kidney disease: Secondary | ICD-10-CM | POA: Diagnosis not present

## 2021-12-08 DIAGNOSIS — I700 Atherosclerosis of aorta: Secondary | ICD-10-CM | POA: Diagnosis not present

## 2021-12-08 DIAGNOSIS — K5900 Constipation, unspecified: Secondary | ICD-10-CM | POA: Diagnosis not present

## 2021-12-09 DIAGNOSIS — E785 Hyperlipidemia, unspecified: Secondary | ICD-10-CM | POA: Diagnosis not present

## 2021-12-09 DIAGNOSIS — Z792 Long term (current) use of antibiotics: Secondary | ICD-10-CM | POA: Diagnosis not present

## 2021-12-09 DIAGNOSIS — N183 Chronic kidney disease, stage 3 unspecified: Secondary | ICD-10-CM | POA: Diagnosis not present

## 2021-12-09 DIAGNOSIS — R69 Illness, unspecified: Secondary | ICD-10-CM | POA: Diagnosis not present

## 2021-12-09 DIAGNOSIS — K5900 Constipation, unspecified: Secondary | ICD-10-CM | POA: Diagnosis not present

## 2021-12-09 DIAGNOSIS — R918 Other nonspecific abnormal finding of lung field: Secondary | ICD-10-CM | POA: Diagnosis not present

## 2021-12-09 DIAGNOSIS — E1122 Type 2 diabetes mellitus with diabetic chronic kidney disease: Secondary | ICD-10-CM | POA: Diagnosis not present

## 2021-12-10 DIAGNOSIS — R69 Illness, unspecified: Secondary | ICD-10-CM | POA: Diagnosis not present

## 2021-12-10 DIAGNOSIS — E1122 Type 2 diabetes mellitus with diabetic chronic kidney disease: Secondary | ICD-10-CM | POA: Diagnosis not present

## 2021-12-10 DIAGNOSIS — N179 Acute kidney failure, unspecified: Secondary | ICD-10-CM | POA: Diagnosis not present

## 2021-12-10 DIAGNOSIS — K5900 Constipation, unspecified: Secondary | ICD-10-CM | POA: Diagnosis not present

## 2021-12-10 DIAGNOSIS — N183 Chronic kidney disease, stage 3 unspecified: Secondary | ICD-10-CM | POA: Diagnosis not present

## 2021-12-10 DIAGNOSIS — E785 Hyperlipidemia, unspecified: Secondary | ICD-10-CM | POA: Diagnosis not present

## 2021-12-10 DIAGNOSIS — Z7984 Long term (current) use of oral hypoglycemic drugs: Secondary | ICD-10-CM | POA: Diagnosis not present

## 2021-12-10 DIAGNOSIS — R918 Other nonspecific abnormal finding of lung field: Secondary | ICD-10-CM | POA: Diagnosis not present

## 2021-12-13 DIAGNOSIS — E1169 Type 2 diabetes mellitus with other specified complication: Secondary | ICD-10-CM | POA: Diagnosis not present

## 2021-12-30 DIAGNOSIS — J181 Lobar pneumonia, unspecified organism: Secondary | ICD-10-CM | POA: Diagnosis not present

## 2021-12-30 DIAGNOSIS — J431 Panlobular emphysema: Secondary | ICD-10-CM | POA: Diagnosis not present

## 2022-01-08 DIAGNOSIS — N1832 Chronic kidney disease, stage 3b: Secondary | ICD-10-CM | POA: Diagnosis not present

## 2022-01-08 DIAGNOSIS — I38 Endocarditis, valve unspecified: Secondary | ICD-10-CM | POA: Diagnosis not present

## 2022-01-08 DIAGNOSIS — K5900 Constipation, unspecified: Secondary | ICD-10-CM | POA: Diagnosis not present

## 2022-01-08 DIAGNOSIS — R918 Other nonspecific abnormal finding of lung field: Secondary | ICD-10-CM | POA: Diagnosis not present

## 2022-01-08 DIAGNOSIS — K219 Gastro-esophageal reflux disease without esophagitis: Secondary | ICD-10-CM | POA: Diagnosis not present

## 2022-01-08 DIAGNOSIS — G8929 Other chronic pain: Secondary | ICD-10-CM | POA: Diagnosis not present

## 2022-01-08 DIAGNOSIS — R69 Illness, unspecified: Secondary | ICD-10-CM | POA: Diagnosis not present

## 2022-01-19 ENCOUNTER — Encounter: Payer: Self-pay | Admitting: Cardiology

## 2022-01-19 ENCOUNTER — Ambulatory Visit (INDEPENDENT_AMBULATORY_CARE_PROVIDER_SITE_OTHER): Payer: Medicare HMO | Admitting: Cardiology

## 2022-01-19 VITALS — BP 130/76 | HR 84 | Ht 73.0 in | Wt 210.6 lb

## 2022-01-19 DIAGNOSIS — I350 Nonrheumatic aortic (valve) stenosis: Secondary | ICD-10-CM | POA: Diagnosis not present

## 2022-01-19 DIAGNOSIS — E782 Mixed hyperlipidemia: Secondary | ICD-10-CM

## 2022-01-19 NOTE — Patient Instructions (Signed)

## 2022-01-19 NOTE — Progress Notes (Signed)
Clinical Summary [name] is a 81 y.o.male seen today for follow up of the following medical problems.    1. Aortic stenosis - echo 09/2016 mean grad 30, AVA VTI 0.83, VTI dimensionless index 0.24. Overall moderate to severe AS.   07/2017 echo: LVEF 55-60%, mod AS, mod AI. AS mean grad 22, AVA VTI 1.3, dimensionless index 0.32   08/2018 echo LVEF 55-60%, grade I diastolic dysfunction, moderate AS AVA VTI 1.2 AV mean 20       09/2019 echo LVEF 60-65%, grade I DDx. Moderate AS (mean grad 15, AVA VTI 0.9, DI 0.51. LVOT Diam 1.5) - the difference from 08/2018 appears to be a smaller LVOT diameter used.       12/2020 echo LVEF 50-55%, hypokiensis inferior/inferolateral walls, grade II dd, AV mean grade 22, DI 0.37, AVA VTI 0.94    - no SOB./DOE, no chest pain. Walks 1/4 daily without symptoms.           2. Hyperlipidemia - labs followed by pcp      3. CKD - last Cr was 1.8      4. Lung mass - noted on 11/2021 CT at [facility]  Dense, masslike subpleural consolidation of the peripheral right  upper lobe, measuring at least 10.7 x 2.6 cm. This is highly  concerning for primary lung malignancy in the high risk setting of  emphysema, although infection is a differential consideration - ongoing workup at [facility]  5. Aortic aneurysm - 3. Enlargement of the tubular ascending thoracic aorta, measuring up  to 4.3 x 4.2 cm   Wife passed recently 02/2020 Past Medical History:  Diagnosis Date   Diabetes mellitus without complication (HCC)    Neuropathy      No Known Allergies   Current Outpatient Medications  Medication Sig Dispense Refill   aspirin EC 81 MG tablet Take 1 tablet (81 mg total) by mouth daily. 90 tablet 3   atorvastatin (LIPITOR) 20 MG tablet Take 20 mg by mouth daily.     cefdinir (OMNICEF) 300 MG capsule Take 300 mg by mouth daily.     celecoxib (CELEBREX) 100 MG capsule Take 100 mg by mouth daily.     Cholecalciferol (VITAMIN D-3) 25 MCG (1000 UT)  CAPS Take by mouth.     Ferrous Sulfate Dried (HIGH POTENCY IRON) 65 MG TABS Take by mouth.     glipiZIDE (GLUCOTROL) 5 MG tablet Take 5 mg by mouth every morning.     losartan (COZAAR) 25 MG tablet Take 12.5 mg by mouth daily.     minoxidil (LONITEN) 2.5 MG tablet Take 2.5 mg by mouth daily.     mirtazapine (REMERON) 15 MG tablet Take 15 mg by mouth at bedtime.     pantoprazole (PROTONIX) 40 MG tablet Take 40 mg by mouth daily.     pioglitazone (ACTOS) 30 MG tablet Take 30 mg by mouth daily.     predniSONE (DELTASONE) 10 MG tablet Take 40 mg by mouth daily.     No current facility-administered medications for this visit.     Past Surgical History:  Procedure Laterality Date   ROTATOR CUFF REPAIR Right    TOTAL KNEE ARTHROPLASTY       No Known Allergies    Family History  Problem Relation Age of Onset   Heart Problems Father    Heart attack Sister      Social History [name] reports that he has been smoking cigarettes. He has been  smoking an average of .1 packs per day. He has never used smokeless tobacco. [name] reports no history of alcohol use.   Review of Systems CONSTITUTIONAL: No weight loss, fever, chills, weakness or fatigue.  HEENT: Eyes: No visual loss, blurred vision, double vision or yellow sclerae.No hearing loss, sneezing, congestion, runny nose or sore throat.  SKIN: No rash or itching.  CARDIOVASCULAR: per hpi RESPIRATORY: No shortness of breath, cough or sputum.  GASTROINTESTINAL: No anorexia, nausea, vomiting or diarrhea. No abdominal pain or blood.  GENITOURINARY: No burning on urination, no polyuria NEUROLOGICAL: No headache, dizziness, syncope, paralysis, ataxia, numbness or tingling in the extremities. No change in bowel or bladder control.  MUSCULOSKELETAL: No muscle, back pain, joint pain or stiffness.  LYMPHATICS: No enlarged nodes. No history of splenectomy.  PSYCHIATRIC: No history of depression or anxiety.  ENDOCRINOLOGIC: No  reports of sweating, cold or heat intolerance. No polyuria or polydipsia.  Marland Kitchen   Physical Examination Today's Vitals   01/19/22 1142  BP: 130/76  Pulse: 84  SpO2: 96%  Weight: 210 lb 9.6 oz (95.5 kg)  Height: 6\' 1"  (1.854 m)   Body mass index is 27.79 kg/m.  Gen: resting comfortably, no acute distress HEENT: no scleral icterus, pupils equal round and reactive, no palptable cervical adenopathy,  CV: RRR, 2/6 systolic murmur rusb, no jvd Resp: Clear to auscultation bilaterally GI: abdomen is soft, non-tender, non-distended, normal bowel sounds, no hepatosplenomegaly MSK: extremities are warm, no edema.  Skin: warm, no rash Neuro:  no focal deficits Psych: appropriate affect   Diagnostic Studies  09/2016 echo Study Conclusions   - Left ventricle: The cavity size was normal. Wall thickness was   normal. Systolic function was at the lower limits of normal. The   estimated ejection fraction was in the range of 50% to 55%.   Doppler parameters are consistent with abnormal left ventricular   relaxation (grade 1 diastolic dysfunction). Doppler parameters   are consistent with high ventricular filling pressure. - Regional wall motion abnormality: Mild hypokinesis of the   basal-mid inferior and mid inferolateral myocardium. - Aortic valve: Moderately to severely calcified annulus.   Moderately thickened, moderately calcified leaflets. There was   moderate to severe stenosis. There was mild to moderate   regurgitation. Peak velocity (S): 377 cm/s. Mean gradient (S): 30   mm Hg. Valve area (VTI): 0.83 cm^2. Valve area (Vmax): 0.78 cm^2.   Valve area (Vmean): 0.8 cm^2. - Aorta: Mild aortic root dilatation. Aortic root dimension: 40 mm   (ED). - Mitral valve: Moderately to severely calcified annulus. - Left atrium: The atrium was severely dilated.     07/2017 echo   Study Conclusions   - Left ventricle: The cavity size was normal. Wall thickness was   normal. Systolic function  was normal. The estimated ejection   fraction was in the range of 55% to 60%. Wall motion was normal;   there were no regional wall motion abnormalities. Doppler   parameters are consistent with abnormal left ventricular   relaxation (grade 1 diastolic dysfunction). Doppler parameters   are consistent with high ventricular filling pressure. - Aortic valve: Moderately to severely calcified annulus.   Trileaflet; moderately calcified leaflets. There was moderate   stenosis. There was moderate regurgitation. Peak velocity (S):   323 cm/s. Mean gradient (S): 22 mm Hg. Valve area (VTI): 1.3   cm^2. Valve area (Vmax): 1.1 cm^2. Valve area (Vmean): 1.09 cm^2. - Mitral valve: Calcified annulus.   08/2018 echo IMPRESSIONS  1. The left ventricle has normal systolic function, with an ejection fraction of 55-60%. The cavity size was normal. There is mildly increased left ventricular wall thickness. Left ventricular diastolic Doppler parameters are consistent with impaired  relaxation. Possible basal inferolateral hypokinesis .  2. The right ventricle has normal systolic function. The cavity was normal. There is no increase in right ventricular wall thickness. Right ventricular systolic pressure normal with an estimated pressure of 9.2 mmHg.  3. The aortic valve is functionally bicuspid. Moderate calcification of the aortic valve. Aortic valve regurgitation is mild by color flow Doppler. Moderate stenosis of the aortic valve. LVOT/AV VTI 0.37 and AVA approximately 1.2 cm2. Mild aortic  annular calcification noted.  4. The mitral valve is degenerative. Mild thickening of the mitral valve leaflet. There is moderate mitral annular calcification present.  5. The tricuspid valve is grossly normal.   09/2019 echo 1. Left ventricular ejection fraction, by estimation, is 60 to 65%. The  left ventricle has normal function. The left ventricle has no regional  wall motion abnormalities. There is severe  concentric left ventricular  hypertrophy. Left ventricular diastolic   parameters are consistent with Grade I diastolic dysfunction (impaired  relaxation). Elevated left ventricular end-diastolic pressure.   2. Right ventricular systolic function is low normal. The right  ventricular size is normal.   3. The mitral valve is degenerative. No evidence of mitral valve  regurgitation. Mild mitral stenosis.   4. Aortic valve leaflets were poorly visualized. Overall, aortic stenosis  appeared moderate in severity.. The aortic valve has an indeterminant  number of cusps. Aortic valve regurgitation is mild. Moderate aortic valve  stenosis.      12/2020 echo IMPRESSIONS     1. Hypokinesis of the inferolateral wall (base, mid), inferior wall  (base).. Left ventricular ejection fraction, by estimation, is 50 to 55%.  The left ventricle has low normal function. The left ventricle has no  regional wall motion abnormalities. Left  ventricular diastolic parameters are consistent with Grade II diastolic  dysfunction (pseudonormalization). Elevated left atrial pressure.   2. Right ventricular systolic function is normal. The right ventricular  size is normal.   3. The mitral valve is abnormal. Trivial mitral valve regurgitation.   4. AV is not well visualized. Peak and mean gradients through the valve  are 38 and 22 mm Hg respectively Dimensionless index is 0.37. Overall  consistent with moderate AS. Compared to echo from 2021 Mean gradient is  increased (14 to 22 mm now) and  dimensionless index is decreased. . The aortic valve is abnormal. Aortic  valve regurgitation is mild to moderate.   5. The inferior vena cava is normal in size with greater than 50%  respiratory variability, suggesting right atrial pressure of 3 mmHg   Assessment and Plan  1. Aortic stenosis - moderate AS by echo last year, repeat for ongoing surveillance - no recent symptoms.      2. Hyperlipidemia -request pcp  labs   3. Abnormal echo - most recent echo with relative decrease in LVEF but still WNL at 50-55%, reproted WMA inferolateral wall.  - no symptoms. With normal LVEF and advanced kidney dysfunction and absence of symptoms would just monitor at this time. - we will f/u repeat echo  4. Lung mass - ongoign workup at [facility].     Arnoldo Lenis, M.D

## 2022-01-20 DIAGNOSIS — R911 Solitary pulmonary nodule: Secondary | ICD-10-CM | POA: Diagnosis not present

## 2022-01-20 DIAGNOSIS — J984 Other disorders of lung: Secondary | ICD-10-CM | POA: Diagnosis not present

## 2022-01-20 DIAGNOSIS — J181 Lobar pneumonia, unspecified organism: Secondary | ICD-10-CM | POA: Diagnosis not present

## 2022-01-20 DIAGNOSIS — E041 Nontoxic single thyroid nodule: Secondary | ICD-10-CM | POA: Diagnosis not present

## 2022-01-20 DIAGNOSIS — N281 Cyst of kidney, acquired: Secondary | ICD-10-CM | POA: Diagnosis not present

## 2022-01-20 DIAGNOSIS — J439 Emphysema, unspecified: Secondary | ICD-10-CM | POA: Diagnosis not present

## 2022-01-25 ENCOUNTER — Other Ambulatory Visit: Payer: Medicare HMO

## 2022-01-25 ENCOUNTER — Encounter: Payer: Self-pay | Admitting: Cardiology

## 2022-01-31 ENCOUNTER — Other Ambulatory Visit: Payer: Self-pay | Admitting: *Deleted

## 2022-01-31 ENCOUNTER — Other Ambulatory Visit: Payer: Medicare HMO

## 2022-01-31 NOTE — Patient Outreach (Signed)
  Care Coordination   01/31/2022 Name: Tristan Brooks MRN: [id number] DOB: 05-Jun-1941   Care Coordination Outreach Attempts:  An unsuccessful telephone outreach was attempted today to offer the patient information about available care coordination services as a benefit of their health plan.   Follow Up Plan:  Additional outreach attempts will be made to offer the patient care coordination information and services.   Encounter Outcome:  No Answer  Care Coordination Interventions Activated:  No   Care Coordination Interventions:  No, not indicated    Kemper Durie, RN, MSN, [facility] Care Coordinator [phone]

## 2022-02-01 ENCOUNTER — Encounter: Payer: Self-pay | Admitting: *Deleted

## 2022-02-01 ENCOUNTER — Ambulatory Visit (INDEPENDENT_AMBULATORY_CARE_PROVIDER_SITE_OTHER): Payer: Medicare HMO

## 2022-02-01 ENCOUNTER — Other Ambulatory Visit: Payer: Self-pay | Admitting: *Deleted

## 2022-02-01 DIAGNOSIS — E1169 Type 2 diabetes mellitus with other specified complication: Secondary | ICD-10-CM

## 2022-02-01 DIAGNOSIS — I350 Nonrheumatic aortic (valve) stenosis: Secondary | ICD-10-CM | POA: Diagnosis not present

## 2022-02-01 LAB — ECHOCARDIOGRAM COMPLETE
AR max vel: 1 cm2
AV Area VTI: 1.11 cm2
AV Area mean vel: 0.97 cm2
AV Mean grad: 24 mmHg
AV Peak grad: 36.2 mmHg
AV Vena cont: 0.28 cm
Ao pk vel: 3.01 m/s
Area-P 1/2: 2.99 cm2
Calc EF: 58.4 %
MV VTI: 1.61 cm2
P 1/2 time: 798 msec
S' Lateral: 2.61 cm
Single Plane A2C EF: 56.3 %
Single Plane A4C EF: 56.5 %

## 2022-02-01 NOTE — Addendum Note (Signed)
Addended byKemper Durie on: 02/01/2022 09:48 AM   Modules accepted: Orders

## 2022-02-01 NOTE — Patient Outreach (Signed)
  Care Coordination   Initial Visit Note   02/01/2022 Name: DELRICK DEHART MRN: [id number] DOB: [birth year]  EAIN MULLENDORE is a 81 y.o. year old male who sees [facility], [facility] for primary care. I spoke with  Basilio Cairo by phone today  What matters to the patients health and wellness today?  Was seen recently by specialist at [facility] due to concern for possible lung cancer, diagnosed with pneumonia instead.  Will have follow up with PCP this week to discuss treatment plan.  Aware of need for AWV.      Goals Addressed               This Visit's Progress     Recover from Pneumonia (pt-stated)        Care Coordination Interventions: Advised patient to Discuss need for AWV with provider Provided education to patient re: treatment of pneumonia Care Guide referral for community resources (food and utility support) Assessed social determinant of health barriers         SDOH assessments and interventions completed:  Yes  SDOH Interventions Today    Flowsheet Row Most Recent Value  SDOH Interventions   Food Insecurity Interventions Other (Comment)  [Referral to [employer] care guide]  Housing Interventions Other (Comment)  [Community Resource Care Guide]  Transportation Interventions Intervention Not Indicated        Care Coordination Interventions Activated:  Yes  Care Coordination Interventions:  Yes, provided   Follow up plan: Follow up call scheduled for 9/20    Encounter Outcome:  Pt. Visit Completed   Kemper Durie, RN, MSN, [facility] Care Coordinator [phone]

## 2022-02-02 ENCOUNTER — Telehealth: Payer: Self-pay

## 2022-02-02 NOTE — Telephone Encounter (Signed)
   Telephone encounter was:  Unsuccessful.  02/02/2022 Name: Tristan Brooks MRN: [id number] DOB: 06-Jul-1940  Unsuccessful outbound call made today to assist with:  Financial Difficulties related to utilities.  Outreach Attempt:  1st Attempt  Unable to leave message on voicemail no answer.  Melayna Robarts, AAS Paralegal, [facility] Care Guide  Embedded Care Coordination [place]  Care Management  [address] [place], [place] [address] ??[email or web address]  ?? [phone]   [email or web address]

## 2022-02-03 DIAGNOSIS — M25562 Pain in left knee: Secondary | ICD-10-CM | POA: Diagnosis not present

## 2022-02-03 DIAGNOSIS — K219 Gastro-esophageal reflux disease without esophagitis: Secondary | ICD-10-CM | POA: Diagnosis not present

## 2022-02-03 DIAGNOSIS — I712 Thoracic aortic aneurysm, without rupture, unspecified: Secondary | ICD-10-CM | POA: Diagnosis not present

## 2022-02-03 DIAGNOSIS — E7801 Familial hypercholesterolemia: Secondary | ICD-10-CM | POA: Diagnosis not present

## 2022-02-03 DIAGNOSIS — N1832 Chronic kidney disease, stage 3b: Secondary | ICD-10-CM | POA: Diagnosis not present

## 2022-02-03 DIAGNOSIS — I38 Endocarditis, valve unspecified: Secondary | ICD-10-CM | POA: Diagnosis not present

## 2022-02-03 DIAGNOSIS — G8929 Other chronic pain: Secondary | ICD-10-CM | POA: Diagnosis not present

## 2022-02-03 DIAGNOSIS — R918 Other nonspecific abnormal finding of lung field: Secondary | ICD-10-CM | POA: Diagnosis not present

## 2022-02-03 DIAGNOSIS — E1169 Type 2 diabetes mellitus with other specified complication: Secondary | ICD-10-CM | POA: Diagnosis not present

## 2022-02-03 DIAGNOSIS — K5900 Constipation, unspecified: Secondary | ICD-10-CM | POA: Diagnosis not present

## 2022-02-03 DIAGNOSIS — R030 Elevated blood-pressure reading, without diagnosis of hypertension: Secondary | ICD-10-CM | POA: Diagnosis not present

## 2022-02-03 DIAGNOSIS — R69 Illness, unspecified: Secondary | ICD-10-CM | POA: Diagnosis not present

## 2022-02-04 ENCOUNTER — Telehealth: Payer: Self-pay

## 2022-02-04 NOTE — Telephone Encounter (Signed)
   Telephone encounter was:  Unsuccessful.  02/04/2022 Name: Tristan Brooks MRN: [id number] DOB: [birth year]  Unsuccessful outbound call made today to assist with:  Financial Difficulties related to utilities.  Outreach Attempt:  2nd Attempt  A HIPAA compliant voice message was left requesting a return call.  Instructed patient to call back at [phone].  Kaisey Huseby, AAS Paralegal, [facility] Care Guide  Embedded Care Coordination [place]  Care Management  [address] [place], [place] [address] ??[email or web address]  ?? [phone]   [email or web address]

## 2022-02-11 ENCOUNTER — Telehealth: Payer: Self-pay

## 2022-02-11 NOTE — Telephone Encounter (Signed)
   Telephone encounter was:  Unsuccessful.  02/11/2022 Name: Tristan Brooks MRN: [id number] DOB: 10-16-1940  Unsuccessful outbound call made today to assist with:  Financial Difficulties related to utilities.  Outreach Attempt:  3rd Attempt.  Referral closed unable to contact patient.  Unable to leave message on voicemail no answer.   Kinzie Wickes, AAS Paralegal, [facility] Care Guide  Embedded Care Coordination [place]  Care Management  [address] [place], [place] [address] ??[email or web address]  ?? [phone]   [email or web address]

## 2022-02-22 ENCOUNTER — Other Ambulatory Visit: Payer: Self-pay | Admitting: Orthopaedic Surgery

## 2022-02-22 DIAGNOSIS — M79605 Pain in left leg: Secondary | ICD-10-CM

## 2022-02-24 DIAGNOSIS — E1169 Type 2 diabetes mellitus with other specified complication: Secondary | ICD-10-CM | POA: Diagnosis not present

## 2022-02-24 DIAGNOSIS — N189 Chronic kidney disease, unspecified: Secondary | ICD-10-CM | POA: Diagnosis not present

## 2022-02-24 DIAGNOSIS — E7801 Familial hypercholesterolemia: Secondary | ICD-10-CM | POA: Diagnosis not present

## 2022-02-28 DIAGNOSIS — R918 Other nonspecific abnormal finding of lung field: Secondary | ICD-10-CM | POA: Diagnosis not present

## 2022-02-28 DIAGNOSIS — G8929 Other chronic pain: Secondary | ICD-10-CM | POA: Diagnosis not present

## 2022-02-28 DIAGNOSIS — K219 Gastro-esophageal reflux disease without esophagitis: Secondary | ICD-10-CM | POA: Diagnosis not present

## 2022-02-28 DIAGNOSIS — N1832 Chronic kidney disease, stage 3b: Secondary | ICD-10-CM | POA: Diagnosis not present

## 2022-02-28 DIAGNOSIS — I38 Endocarditis, valve unspecified: Secondary | ICD-10-CM | POA: Diagnosis not present

## 2022-02-28 DIAGNOSIS — R030 Elevated blood-pressure reading, without diagnosis of hypertension: Secondary | ICD-10-CM | POA: Diagnosis not present

## 2022-02-28 DIAGNOSIS — K5900 Constipation, unspecified: Secondary | ICD-10-CM | POA: Diagnosis not present

## 2022-02-28 DIAGNOSIS — Z23 Encounter for immunization: Secondary | ICD-10-CM | POA: Diagnosis not present

## 2022-02-28 DIAGNOSIS — M25562 Pain in left knee: Secondary | ICD-10-CM | POA: Diagnosis not present

## 2022-02-28 DIAGNOSIS — E1169 Type 2 diabetes mellitus with other specified complication: Secondary | ICD-10-CM | POA: Diagnosis not present

## 2022-02-28 DIAGNOSIS — I712 Thoracic aortic aneurysm, without rupture, unspecified: Secondary | ICD-10-CM | POA: Diagnosis not present

## 2022-02-28 DIAGNOSIS — R69 Illness, unspecified: Secondary | ICD-10-CM | POA: Diagnosis not present

## 2022-03-09 ENCOUNTER — Encounter: Payer: Self-pay | Admitting: *Deleted

## 2022-03-11 ENCOUNTER — Ambulatory Visit: Payer: Self-pay | Admitting: *Deleted

## 2022-03-11 NOTE — Patient Outreach (Signed)
  Care Coordination   03/11/2022 Name: Tristan Brooks MRN: [id number] DOB: Jan 20, 1941   Care Coordination Outreach Attempts:  An unsuccessful telephone outreach was attempted for a scheduled appointment today.  Follow Up Plan:  Additional outreach attempts will be made to offer the patient care coordination information and services.   Encounter Outcome:  No Answer  Care Coordination Interventions Activated:  No   Care Coordination Interventions:  No, not indicated    Valente David, RN, MSN, [facility] Care Management Care Management Coordinator [phone]

## 2022-03-16 ENCOUNTER — Telehealth: Payer: Self-pay | Admitting: *Deleted

## 2022-03-16 NOTE — Chronic Care Management (AMB) (Signed)
  Care Coordination   Note   03/16/2022 Name: Tristan Brooks MRN: [id number] DOB: 10-12-1940  Tristan Brooks is a 81 y.o. year old male who sees [facility] for primary care. I reached out to Sallee Lange by phone today to reschedule follow up call with [facility] for care coordination services.   Follow up plan:  Unsuccessful telephone outreach attempt made. A HIPAA compliant phone message was left for the patient providing contact information and requesting a return call.  Encounter Outcome:  No Answer  [place]  Direct Dial: [phone]

## 2022-03-21 ENCOUNTER — Institutional Professional Consult (permissible substitution): Payer: Medicare HMO | Admitting: Physician Assistant

## 2022-03-21 DIAGNOSIS — I7121 Aneurysm of the ascending aorta, without rupture: Secondary | ICD-10-CM | POA: Diagnosis not present

## 2022-03-21 DIAGNOSIS — I712 Thoracic aortic aneurysm, without rupture, unspecified: Secondary | ICD-10-CM | POA: Insufficient documentation

## 2022-03-21 NOTE — Patient Instructions (Signed)
Continue to take your blood pressure and cholesterol medications as prescribed.  Avoid any heavy lifting greater than 30 pounds  Follow-up in 1 year with CTA chest to reevaluate the ascending aortic aneurysm

## 2022-03-21 NOTE — Chronic Care Management (AMB) (Signed)
  Care Coordination   Note   03/21/2022 Name: Tristan Brooks MRN: [id number] DOB: 1941-03-08  Tristan Brooks is a 81 y.o. year old male who sees [facility] for primary care. I reached out to Sallee Lange by phone today to reschedule follow up call with  care coordination services.   Follow up plan:  Unsuccessful telephone outreach attempt made. A HIPAA compliant phone message was left for the patient providing contact information and requesting a return call.  Encounter Outcome:  No Answer  [place]  Direct Dial: [phone]

## 2022-03-21 NOTE — Progress Notes (Signed)
[address]       [place] [address]             [phone]       PCP is [facility] Referring Provider is Donetta Potts, MD  Reason for consult: Evaluation and surveillance of thoracic aortic aneurysm.   HPI: [name] is an 81 year old gentleman with a history of hypertension, type 2 diabetes mellitus, stage III chronic kidney disease and known moderate aortic stenosis with a bicuspid aortic valve that is being followed regularly by Dr. Wyline Mood.  Tristan Brooks was also discovered on CT scan in June of this year to have a 10.7 x 2.6 cm spiculated right middle lobe mass that is PET-avid. This is being evaluated by Dr. Shaune Leeks of [facility] in [place].    On most recent echocardiogram obtained in August of this year, the bicuspid aortic valve had an estimated area of 1.11 cm.  There was moderate aortic insufficiency.  The aortic root was dilated to 42 mm.  Left ventricular ejection fraction was estimated at 55% with asymmetric left ventricular hypertrophy involving primarily the interventricular septum.  Dr. Wyline Mood documented that Tristan Brooks was walking on a regular basis without chest pain or dyspnea and did not feel that any intervention to the aortic valve was indicated.  Tristan Brooks lives alone and drove himself to the office today.  Tells me he walks at least 1/4 mile a day.  He denies having any pain, syncope, or shortness of breath.  He said he is most limited by pain in his lower extremities that he said is due to peripheral neuropathy.    Past Medical History:  Diagnosis Date   Diabetes mellitus without complication (HCC)    Neuropathy     Past Surgical History:  Procedure Laterality Date   ROTATOR CUFF REPAIR Right    TOTAL KNEE ARTHROPLASTY      Family History  Problem Relation Age of Onset   Heart Problems Father    Heart attack Sister     Social History Social History   Tobacco Use    Smoking status: Some Days    Packs/day: 0.10    Types: Cigarettes   Smokeless tobacco: Never   Tobacco comments:    4-5 ciggs per day  Substance Use Topics   Alcohol use: No    Current Outpatient Medications  Medication Sig Dispense Refill   aspirin EC 81 MG tablet Take 1 tablet (81 mg total) by mouth daily. 90 tablet 3   atorvastatin (LIPITOR) 20 MG tablet Take 20 mg by mouth daily.     celecoxib (CELEBREX) 100 MG capsule Take 100 mg by mouth daily.     Cholecalciferol (VITAMIN D-3) 25 MCG (1000 UT) CAPS Take by mouth.     Ferrous Sulfate Dried (HIGH POTENCY IRON) 65 MG TABS Take by mouth.     glipiZIDE (GLUCOTROL) 5 MG tablet Take 5 mg by mouth every morning.     losartan (COZAAR) 25 MG tablet Take 12.5 mg by mouth daily.     minoxidil (LONITEN) 2.5 MG tablet Take 2.5 mg by mouth daily.     mirtazapine (REMERON) 15 MG tablet Take 15 mg by mouth at bedtime.     pantoprazole (PROTONIX) 40 MG tablet Take 40 mg by mouth daily.     pioglitazone (ACTOS) 30 MG tablet Take 30 mg by mouth daily.     predniSONE (DELTASONE) 10 MG tablet Take  40 mg by mouth daily.     No current facility-administered medications for this visit.    No Known Allergies  Review of Systems: Review of Systems  Constitutional: Negative.   HENT: Negative.    Eyes:  Positive for blurred vision.  Respiratory:  Positive for cough.   Cardiovascular: Negative.   Gastrointestinal: Negative.   Genitourinary:        CKD stage III  Musculoskeletal:  Positive for joint pain.  Skin: Negative.   Neurological: Negative.   Psychiatric/Behavioral: Negative.       Physical Exam: Vital signs BP 124/76 Pulse 78 Respirations 20 SPO2 93% on room air  General: [name] is a very pleasant gentleman in no acute distress.  He walked in to the office using a cane. HEENT: Normocephalic and atraumatic.  Moist mucous membranes. Neck: Supple, no JVD or carotid bruit Heart: Regular rate and rhythm, soft systolic  murmur Chest: Symmetrical breath sounds clear to auscultation. Abdomen: Soft and nontender Extremities: All warm and well-perfused.  Radial pulses are equal.  No peripheral edema.  Diagnostic Tests: ECHOCARDIOGRAM REPORT         Patient Name:   Tristan Brooks Date of Exam: 02/01/2022  Medical Rec #:  [id number]         Height:       73.0 in  Accession #:    [id number]        Weight:       210.6 lb  Date of Birth:  1941-03-20         BSA:          2.199 m  Patient Age:    54 years          BP:           130/76 mmHg  Patient Gender: M                 HR:           74 bpm.  Exam Location:  [place]   Procedure: 2D Echo, Cardiac Doppler, Color Doppler and Strain Analysis   Indications:    I35.0 Nonrheumatic aortic (valve) stenosis     History:        Patient has prior history of Echocardiogram examinations,  most                  recent 12/24/2020. Aortic Valve Disease; Risk                  Factors:Hypertension, Diabetes, Dyslipidemia and Current  Smoker.     Sonographer:    Jeneen Montgomery RDMS, RVT, RDCS  Referring Phys: [id number] Alphonse Guild BRANCH      Sonographer Comments: Image acquisition challenging due to patient body  habitus.  IMPRESSIONS     1. Left ventricular ejection fraction, by estimation, is 55%. The left  ventricle has normal function. The left ventricle demonstrates regional  wall motion abnormalities (see scoring diagram/findings for description).  There is mild asymmetric left  ventricular hypertrophy of the septal segment. Left ventricular diastolic  parameters are consistent with Grade I diastolic dysfunction (impaired  relaxation). The average left ventricular global longitudinal strain is  -13.2 %. The global longitudinal  strain is abnormal.   2. RA-RV gradient 8 mmHg suggesting normal RVSP. Right ventricular  systolic function is normal. The right ventricular size is normal.   3. The mitral valve is degenerative. Trivial mitral valve regurgitation.   Moderate mitral annular calcification.   4.  The aortic valve is bicuspid. There is moderate calcification of the  aortic valve. Aortic valve regurgitation is moderate. Moderate to severe  aortic valve stenosis. Aortic valve area, by VTI measures 1.11 cm. Aortic  valve mean gradient measures 24.0   mmHg. Dimentionless index 0.44.   5. Aortic dilatation noted. There is mild dilatation of the aortic root,  measuring 42 mm. There is mild dilatation of the ascending aorta,  measuring 39 mm.   6. Unable to estimate CVP.   Comparison(s): Prior images reviewed side by side. LVEF and wall motion  were similar on the prior study. Aortic stenosis moderate to severe with  mean gradient 24 mmHg up from 22 mmHg.   FINDINGS   Left Ventricle: Left ventricular ejection fraction, by estimation, is  55%. The left ventricle has normal function. The left ventricle  demonstrates regional wall motion abnormalities. The average left  ventricular global longitudinal strain is -13.2 %.  The global longitudinal strain is abnormal. The left ventricular internal  cavity size was normal in size. There is mild asymmetric left ventricular  hypertrophy of the septal segment. Left ventricular diastolic parameters  are consistent with Grade I  diastolic dysfunction (impaired relaxation).      LV Wall Scoring:  The apical septal segment and apex are hypokinetic. The entire anterior  wall,  entire lateral wall, anterior septum, entire inferior wall, mid  inferoseptal  segment, and basal inferoseptal segment are normal.   Right Ventricle: RA-RV gradient 8 mmHg suggesting normal RVSP. The right  ventricular size is normal. No increase in right ventricular wall  thickness. Right ventricular systolic function is normal.   Left Atrium: Left atrial size was normal in size.   Right Atrium: Right atrial size was normal in size.   Pericardium: There is no evidence of pericardial effusion.   Mitral Valve: The mitral  valve is degenerative in appearance. Moderate  mitral annular calcification. Trivial mitral valve regurgitation. MV peak  gradient, 11.2 mmHg. The mean mitral valve gradient is 3.0 mmHg.   Tricuspid Valve: The tricuspid valve is grossly normal. Tricuspid valve  regurgitation is trivial.   Aortic Valve: The aortic valve is bicuspid. There is moderate  calcification of the aortic valve. There is mild aortic valve annular  calcification. Aortic valve regurgitation is moderate. Aortic  regurgitation PHT measures 798 msec. Moderate to severe  aortic stenosis is present. Aortic valve mean gradient measures 24.0 mmHg.  Aortic valve peak gradient measures 36.2 mmHg. Aortic valve area, by VTI  measures 1.11 cm.   Pulmonic Valve: The pulmonic valve was grossly normal. Pulmonic valve  regurgitation is trivial.   Aorta: Aortic dilatation noted. There is mild dilatation of the aortic  root, measuring 42 mm. There is mild dilatation of the ascending aorta,  measuring 39 mm.   Venous: Unable to estimate CVP. The inferior vena cava was not well  visualized.   IAS/Shunts: No atrial level shunt detected by color flow Doppler.      LEFT VENTRICLE  PLAX 2D  LVIDd:         4.22 cm      Diastology  LVIDs:         2.61 cm      LV e' medial:    3.59 cm/s  LV PW:         1.18 cm      LV E/e' medial:  24.7  LV IVS:        1.28 cm  LV e' lateral:   5.77 cm/s  LVOT diam:     1.80 cm      LV E/e' lateral: 15.4  LV SV:         77  LV SV Index:   35           2D Longitudinal Strain  LVOT Area:     2.54 cm     2D Strain GLS (A2C):   -14.4 %                              2D Strain GLS (A3C):   -13.3 %                              2D Strain GLS (A4C):   -11.9 %  LV Volumes (MOD)            2D Strain GLS Avg:     -13.2 %  LV vol d, MOD A2C: 86.8 ml  LV vol d, MOD A4C: 102.0 ml  LV vol s, MOD A2C: 37.9 ml  LV vol s, MOD A4C: 44.4 ml  LV SV MOD A2C:     48.9 ml  LV SV MOD A4C:     102.0 ml  LV SV MOD  BP:      56.0 ml   RIGHT VENTRICLE  RV S prime:     11.50 cm/s  TAPSE (M-mode): 2.0 cm   LEFT ATRIUM              Index        RIGHT ATRIUM           Index  LA diam:        2.80 cm  1.27 cm/m   RA Area:     12.10 cm  LA Vol (A2C):   110.0 ml 50.01 ml/m  RA Volume:   27.80 ml  12.64 ml/m  LA Vol (A4C):   72.5 ml  32.96 ml/m  LA Biplane Vol: 90.3 ml  41.06 ml/m   AORTIC VALVE  AV Area (Vmax):    1.00 cm  AV Area (Vmean):   0.97 cm  AV Area (VTI):     1.11 cm  AV Vmax:           301.00 cm/s  AV Vmean:          218.500 cm/s  AV VTI:            0.689 m  AV Peak Grad:      36.2 mmHg  AV Mean Grad:      24.0 mmHg  LVOT Vmax:         118.00 cm/s  LVOT Vmean:        83.400 cm/s  LVOT VTI:          0.302 m  LVOT/AV VTI ratio: 0.44  AI PHT:            798 msec  AR Vena Contracta: 0.27 cm     AORTA  Ao Root diam: 4.20 cm  Ao Asc diam:  3.90 cm   MITRAL VALVE                TRICUSPID VALVE  MV Area (PHT): 2.99 cm     TR Peak grad:   8.0 mmHg  MV Area VTI:   1.61 cm     TR Vmax:  141.00 cm/s  MV Peak grad:  11.2 mmHg  MV Mean grad:  3.0 mmHg     SHUNTS  MV Vmax:       1.67 m/s     Systemic VTI:  0.30 m  MV Vmean:      79.3 cm/s    Systemic Diam: 1.80 cm  MV Decel Time: 254 msec  MV E velocity: 88.70 cm/s  MV A velocity: 148.00 cm/s  MV E/A ratio:  0.60   Rozann Lesches MD  Electronically signed by Rozann Lesches MD  Signature Date/Time: 02/01/2022/3:12:45 PM       Impression / Plan: Very pleasant 81 year old gentleman with history of type 2 diabetes mellitus, stage III CKD, hypertension, peripheral vascular disease, and known bicuspid aortic valve with moderate to severe aortic stenosis and moderate aortic insufficiency.  Followed by Dr. Carlyle Dolly with next cardiology follow-up scheduled for February 2024.  He was noted to have dilation of the thoracic aorta on recent echocardiogram measuring about 4.2 cm.  This was confirmed by CT scan he had in the summer  where it measured 4.2 x 4.3 cm.  The aneurysm is asymptomatic as is the aortic stenosis per cardiology notes.  No intervention is indicated for the aortic aneurysm but we will certainly continue to follow with annual CTA per recommendations.  Mr. Bogue blood pressure appears to be well controlled.  He lives a largely sedentary lifestyle. He was seen by Dr. Percell Miller at [facility] and is in [place] for the "mass" in the right lung. We will plan for follow-up in 1 year with CTA to monitor the thoracic aortic aneurysm.   Antony Odea, PA-C [facility] [phone]

## 2022-03-28 NOTE — Chronic Care Management (AMB) (Signed)
  Care Coordination  Outreach Note  03/28/2022 Name: Tristan Brooks MRN: [id number] DOB: 05/27/1941   Care Coordination Outreach Attempts: A third unsuccessful outreach was attempted today to offer the patient with information about available care coordination services as a benefit of their health plan.   Follow Up Plan:  No further outreach attempts will be made at this time. We have been unable to contact the patient to offer or enroll patient in care coordination services  Encounter Outcome:  No Answer  [place]: [phone]

## 2022-03-28 NOTE — Chronic Care Management (AMB) (Signed)
  Care Coordination   Note   03/28/2022 Name: Tristan Brooks MRN: [id number] DOB: 1940-07-19  Tristan Brooks is a 81 y.o. year old male who sees [facility] for primary care. I reached out to Sallee Lange by phone today to offer care coordination services.  [name] was given information about Care Coordination services today including:   The Care Coordination services include support from the care team which includes your Nurse Coordinator, Clinical Social Worker, or Pharmacist.  The Care Coordination team is here to help remove barriers to the health concerns and goals most important to you. Care Coordination services are voluntary, and the patient may decline or stop services at any time by request to their care team member.   Care Coordination Consent Status: Patient agreed to services and verbal consent obtained.   Follow up plan:  Telephone appointment with care coordination team member scheduled for:  04/05/22  Encounter Outcome:  Pt. Scheduled

## 2022-04-05 ENCOUNTER — Ambulatory Visit: Payer: Self-pay | Admitting: *Deleted

## 2022-04-05 NOTE — Patient Outreach (Signed)
  Care Coordination   04/05/2022 Name: Tristan Brooks MRN: [id number] DOB: 1940-12-24   Care Coordination Outreach Attempts:  An unsuccessful telephone outreach was attempted for a scheduled appointment today.  Follow Up Plan:  Additional outreach attempts will be made to offer the patient care coordination information and services.   Encounter Outcome:  No Answer  Care Coordination Interventions Activated:  No   Care Coordination Interventions:  No, not indicated    Valente David, RN, MSN, [facility] Care Management Care Management Coordinator [phone]

## 2022-04-19 DIAGNOSIS — E1169 Type 2 diabetes mellitus with other specified complication: Secondary | ICD-10-CM | POA: Diagnosis not present

## 2022-04-19 DIAGNOSIS — N1832 Chronic kidney disease, stage 3b: Secondary | ICD-10-CM | POA: Diagnosis not present

## 2022-04-19 DIAGNOSIS — K219 Gastro-esophageal reflux disease without esophagitis: Secondary | ICD-10-CM | POA: Diagnosis not present

## 2022-04-22 ENCOUNTER — Telehealth: Payer: Self-pay | Admitting: *Deleted

## 2022-04-22 NOTE — Progress Notes (Signed)
  Care Coordination Note  04/22/2022 Name: Tristan Brooks MRN: [id number] DOB: December 04, 1940  Tristan Brooks is a 81 y.o. year old male who is a primary care patient of [facility] and is actively engaged with the care management team. I reached out to Sallee Lange by phone today to assist with re-scheduling a follow up visit with the RN Case Manager  Follow up plan: Unsuccessful telephone outreach attempt made. A HIPAA compliant phone message was left for the patient providing contact information and requesting a return call.   [place]  Direct Dial: [phone]

## 2022-04-26 NOTE — Progress Notes (Signed)
  Care Coordination Note  04/26/2022 Name: Tristan Brooks MRN: [id number] DOB: 1941-06-16  Tristan Brooks is a 81 y.o. year old male who is a primary care patient of [facility] and is actively engaged with the care management team. I reached out to Sallee Lange by phone today to assist with re-scheduling a follow up visit with the RN Case Manager  Follow up plan: Unsuccessful telephone outreach attempt made.   [place]  Direct Dial: [phone]

## 2022-05-03 DIAGNOSIS — N1832 Chronic kidney disease, stage 3b: Secondary | ICD-10-CM | POA: Diagnosis not present

## 2022-05-04 DIAGNOSIS — R69 Illness, unspecified: Secondary | ICD-10-CM | POA: Diagnosis not present

## 2022-05-04 DIAGNOSIS — J431 Panlobular emphysema: Secondary | ICD-10-CM | POA: Diagnosis not present

## 2022-05-04 DIAGNOSIS — R911 Solitary pulmonary nodule: Secondary | ICD-10-CM | POA: Diagnosis not present

## 2022-05-04 DIAGNOSIS — R942 Abnormal results of pulmonary function studies: Secondary | ICD-10-CM | POA: Diagnosis not present

## 2022-05-04 DIAGNOSIS — Z716 Tobacco abuse counseling: Secondary | ICD-10-CM | POA: Diagnosis not present

## 2022-05-05 NOTE — Telephone Encounter (Signed)
  Care Coordination Note  05/05/2022 Name: Tristan Brooks MRN: [id number] DOB: 02-11-1941  Tristan Brooks is a 81 y.o. year old male who is a primary care patient of [facility] and is actively engaged with the care management team. I reached out to Basilio Cairo by phone today to assist with re-scheduling a follow up visit with the RN Case Manager  Follow up plan: Telephone appointment with care management team member scheduled for:05/10/22  [facility] Coordination Care Guide  Direct Dial: [phone]

## 2022-05-09 DIAGNOSIS — D649 Anemia, unspecified: Secondary | ICD-10-CM | POA: Diagnosis not present

## 2022-05-09 DIAGNOSIS — N1832 Chronic kidney disease, stage 3b: Secondary | ICD-10-CM | POA: Diagnosis not present

## 2022-05-09 DIAGNOSIS — I340 Nonrheumatic mitral (valve) insufficiency: Secondary | ICD-10-CM | POA: Diagnosis not present

## 2022-05-09 DIAGNOSIS — I10 Essential (primary) hypertension: Secondary | ICD-10-CM | POA: Diagnosis not present

## 2022-05-10 ENCOUNTER — Encounter: Payer: Self-pay | Admitting: *Deleted

## 2022-05-17 ENCOUNTER — Ambulatory Visit: Payer: Self-pay | Admitting: *Deleted

## 2022-05-17 NOTE — Patient Outreach (Signed)
  Care Coordination   05/17/2022 Name: Tristan Brooks MRN: [id number] DOB: Jan 29, 1941   Care Coordination Outreach Attempts:  An unsuccessful telephone outreach was attempted for a scheduled appointment today. 3rd consecutive unsuccessful f/u attempt.   Follow Up Plan:  No further outreach attempts will be made at this time. We have been unable to contact the patient to offer or enroll patient in care coordination services  Encounter Outcome:  No Answer   Care Coordination Interventions:  No, not indicated    Demetrios Loll, BSN, RN-BC RN Care Coordinator [facility]  [facility] Direct Dial: [phone] Main #: [phone]

## 2022-05-23 DIAGNOSIS — J984 Other disorders of lung: Secondary | ICD-10-CM | POA: Diagnosis not present

## 2022-05-23 DIAGNOSIS — R911 Solitary pulmonary nodule: Secondary | ICD-10-CM | POA: Diagnosis not present

## 2022-05-23 DIAGNOSIS — J432 Centrilobular emphysema: Secondary | ICD-10-CM | POA: Diagnosis not present

## 2022-05-23 DIAGNOSIS — J8410 Pulmonary fibrosis, unspecified: Secondary | ICD-10-CM | POA: Diagnosis not present

## 2022-05-31 DIAGNOSIS — R918 Other nonspecific abnormal finding of lung field: Secondary | ICD-10-CM | POA: Diagnosis not present

## 2022-05-31 DIAGNOSIS — N1832 Chronic kidney disease, stage 3b: Secondary | ICD-10-CM | POA: Diagnosis not present

## 2022-05-31 DIAGNOSIS — E1169 Type 2 diabetes mellitus with other specified complication: Secondary | ICD-10-CM | POA: Diagnosis not present

## 2022-06-07 ENCOUNTER — Other Ambulatory Visit: Payer: Self-pay | Admitting: Otolaryngology

## 2022-06-07 DIAGNOSIS — E041 Nontoxic single thyroid nodule: Secondary | ICD-10-CM | POA: Diagnosis not present

## 2022-06-15 DIAGNOSIS — E1122 Type 2 diabetes mellitus with diabetic chronic kidney disease: Secondary | ICD-10-CM | POA: Diagnosis not present

## 2022-06-15 DIAGNOSIS — J431 Panlobular emphysema: Secondary | ICD-10-CM | POA: Diagnosis not present

## 2022-06-15 DIAGNOSIS — I129 Hypertensive chronic kidney disease with stage 1 through stage 4 chronic kidney disease, or unspecified chronic kidney disease: Secondary | ICD-10-CM | POA: Diagnosis not present

## 2022-06-15 DIAGNOSIS — I350 Nonrheumatic aortic (valve) stenosis: Secondary | ICD-10-CM | POA: Diagnosis not present

## 2022-06-15 DIAGNOSIS — E785 Hyperlipidemia, unspecified: Secondary | ICD-10-CM | POA: Diagnosis not present

## 2022-06-15 DIAGNOSIS — Z7984 Long term (current) use of oral hypoglycemic drugs: Secondary | ICD-10-CM | POA: Diagnosis not present

## 2022-06-15 DIAGNOSIS — N183 Chronic kidney disease, stage 3 unspecified: Secondary | ICD-10-CM | POA: Diagnosis not present

## 2022-06-15 DIAGNOSIS — R911 Solitary pulmonary nodule: Secondary | ICD-10-CM | POA: Diagnosis not present

## 2022-06-15 DIAGNOSIS — D573 Sickle-cell trait: Secondary | ICD-10-CM | POA: Diagnosis not present

## 2022-06-15 DIAGNOSIS — J449 Chronic obstructive pulmonary disease, unspecified: Secondary | ICD-10-CM | POA: Diagnosis not present

## 2022-06-15 DIAGNOSIS — R918 Other nonspecific abnormal finding of lung field: Secondary | ICD-10-CM | POA: Diagnosis not present

## 2022-06-15 DIAGNOSIS — Z79899 Other long term (current) drug therapy: Secondary | ICD-10-CM | POA: Diagnosis not present

## 2022-06-15 DIAGNOSIS — R69 Illness, unspecified: Secondary | ICD-10-CM | POA: Diagnosis not present

## 2022-06-15 DIAGNOSIS — K219 Gastro-esophageal reflux disease without esophagitis: Secondary | ICD-10-CM | POA: Diagnosis not present

## 2022-06-15 DIAGNOSIS — R011 Cardiac murmur, unspecified: Secondary | ICD-10-CM | POA: Diagnosis not present

## 2022-06-15 DIAGNOSIS — R942 Abnormal results of pulmonary function studies: Secondary | ICD-10-CM | POA: Diagnosis not present

## 2022-06-28 DIAGNOSIS — G8929 Other chronic pain: Secondary | ICD-10-CM | POA: Diagnosis not present

## 2022-06-28 DIAGNOSIS — M25562 Pain in left knee: Secondary | ICD-10-CM | POA: Diagnosis not present

## 2022-06-28 DIAGNOSIS — R69 Illness, unspecified: Secondary | ICD-10-CM | POA: Diagnosis not present

## 2022-06-28 DIAGNOSIS — R918 Other nonspecific abnormal finding of lung field: Secondary | ICD-10-CM | POA: Diagnosis not present

## 2022-06-28 DIAGNOSIS — K5900 Constipation, unspecified: Secondary | ICD-10-CM | POA: Diagnosis not present

## 2022-06-28 DIAGNOSIS — I712 Thoracic aortic aneurysm, without rupture, unspecified: Secondary | ICD-10-CM | POA: Diagnosis not present

## 2022-06-28 DIAGNOSIS — I38 Endocarditis, valve unspecified: Secondary | ICD-10-CM | POA: Diagnosis not present

## 2022-06-28 DIAGNOSIS — K219 Gastro-esophageal reflux disease without esophagitis: Secondary | ICD-10-CM | POA: Diagnosis not present

## 2022-06-28 DIAGNOSIS — F17200 Nicotine dependence, unspecified, uncomplicated: Secondary | ICD-10-CM | POA: Diagnosis not present

## 2022-06-28 DIAGNOSIS — Z23 Encounter for immunization: Secondary | ICD-10-CM | POA: Diagnosis not present

## 2022-06-28 DIAGNOSIS — R030 Elevated blood-pressure reading, without diagnosis of hypertension: Secondary | ICD-10-CM | POA: Diagnosis not present

## 2022-06-28 DIAGNOSIS — N1832 Chronic kidney disease, stage 3b: Secondary | ICD-10-CM | POA: Diagnosis not present

## 2022-06-28 DIAGNOSIS — E1169 Type 2 diabetes mellitus with other specified complication: Secondary | ICD-10-CM | POA: Diagnosis not present

## 2022-07-11 ENCOUNTER — Ambulatory Visit
Admission: RE | Admit: 2022-07-11 | Discharge: 2022-07-11 | Disposition: A | Discharge status: Home / Self Care | Payer: Medicare HMO | Source: Ambulatory Visit | Attending: Otolaryngology | Admitting: Otolaryngology

## 2022-07-11 DIAGNOSIS — E041 Nontoxic single thyroid nodule: Secondary | ICD-10-CM | POA: Diagnosis not present

## 2022-07-21 ENCOUNTER — Other Ambulatory Visit: Payer: Self-pay | Admitting: Otolaryngology

## 2022-07-21 DIAGNOSIS — E041 Nontoxic single thyroid nodule: Secondary | ICD-10-CM

## 2022-07-21 DIAGNOSIS — H40013 Open angle with borderline findings, low risk, bilateral: Secondary | ICD-10-CM | POA: Diagnosis not present

## 2022-07-21 DIAGNOSIS — Z961 Presence of intraocular lens: Secondary | ICD-10-CM | POA: Diagnosis not present

## 2022-07-22 ENCOUNTER — Encounter: Payer: Self-pay | Admitting: *Deleted

## 2022-07-22 ENCOUNTER — Encounter: Payer: Self-pay | Admitting: Internal Medicine

## 2022-07-22 ENCOUNTER — Ambulatory Visit: Payer: Medicare HMO | Attending: Cardiology | Admitting: Cardiology

## 2022-07-22 ENCOUNTER — Encounter: Payer: Self-pay | Admitting: Cardiology

## 2022-07-22 VITALS — BP 118/64 | HR 69 | Ht 73.0 in | Wt 218.8 lb

## 2022-07-22 DIAGNOSIS — I10 Essential (primary) hypertension: Secondary | ICD-10-CM | POA: Diagnosis not present

## 2022-07-22 DIAGNOSIS — I7121 Aneurysm of the ascending aorta, without rupture: Secondary | ICD-10-CM | POA: Diagnosis not present

## 2022-07-22 DIAGNOSIS — E782 Mixed hyperlipidemia: Secondary | ICD-10-CM | POA: Diagnosis not present

## 2022-07-22 DIAGNOSIS — I350 Nonrheumatic aortic (valve) stenosis: Secondary | ICD-10-CM

## 2022-07-22 NOTE — Progress Notes (Signed)
Clinical Summary Tristan Brooks is a 82 y.o.male seen today for follow up of the following medical problems.    1. Aortic stenosis - echo 09/2016 mean grad 30, AVA VTI 0.83, VTI dimensionless index 0.24. Overall moderate to severe AS.   07/2017 echo: LVEF 55-60%, mod AS, mod AI. AS mean grad 22, AVA VTI 1.3, dimensionless index 0.32   08/2018 echo LVEF 92-11%, grade I diastolic dysfunction, moderate AS AVA VTI 1.2 AV mean 20       09/2019 echo LVEF 60-65%, grade I DDx. Moderate AS (mean grad 15, AVA VTI 0.9, DI 0.51. LVOT Diam 1.5) - the difference from 08/2018 appears to be a smaller LVOT diameter used.  -12/2020 echo LVEF 50-55%, hypokiensis inferior/inferolateral walls, grade II dd, AV mean grade 22, DI 0.37, AVA VTI 0.94    01/2022 echo: LVEF 55-60%, mod AS AVA VTI 1.11, mean grad 24, DI 0.44 - no chest pain, no SOB          2. Hyperlipidemia - labs followed by pcp      3. CKD - last Cr was 2         4. Lung mass - noted on 11/2021 CT at [facility]  Dense, masslike subpleural consolidation of the peripheral right  upper lobe, measuring at least 10.7 x 2.6 cm. This is highly  concerning for primary lung malignancy in the high risk setting of  emphysema, although infection is a differential consideration - ongoing workup at [place] 05/2022 - he reports told not cancer, likely infectious process   5. Aortic aneurysm - 3. Enlargement of the tubular ascending thoracic aorta, measuring up  to 4.3 x 4.2 cm CT chest [facility] 05/2022 mild aneurysm 4.2 cm ascending aorta   Wife passed recently 02/2020 Past Medical History:  Diagnosis Date   Diabetes mellitus without complication (HCC)    Neuropathy      No Known Allergies   Current Outpatient Medications  Medication Sig Dispense Refill   aspirin EC 81 MG tablet Take 1 tablet (81 mg total) by mouth daily. 90 tablet 3   atorvastatin (LIPITOR) 20 MG tablet Take 20 mg by mouth daily.     celecoxib  (CELEBREX) 100 MG capsule Take 100 mg by mouth daily. (Patient not taking: Reported on 03/21/2022)     Cholecalciferol (VITAMIN D-3) 25 MCG (1000 UT) CAPS Take by mouth.     Ferrous Sulfate Dried (HIGH POTENCY IRON) 65 MG TABS Take by mouth.     glipiZIDE (GLUCOTROL) 5 MG tablet Take 5 mg by mouth every morning.     losartan (COZAAR) 25 MG tablet Take 12.5 mg by mouth daily. (Patient not taking: Reported on 03/21/2022)     minoxidil (LONITEN) 2.5 MG tablet Take 2.5 mg by mouth daily. (Patient not taking: Reported on 03/21/2022)     mirtazapine (REMERON) 15 MG tablet Take 15 mg by mouth at bedtime. (Patient not taking: Reported on 03/21/2022)     pantoprazole (PROTONIX) 40 MG tablet Take 40 mg by mouth daily.     pioglitazone (ACTOS) 30 MG tablet Take 30 mg by mouth daily.     No current facility-administered medications for this visit.     Past Surgical History:  Procedure Laterality Date   ROTATOR CUFF REPAIR Right    TOTAL KNEE ARTHROPLASTY       No Known Allergies    Family History  Problem Relation Age of Onset   Heart Problems Father  Heart attack Sister      Social History [name] reports that he has been smoking cigarettes. He has been smoking an average of .1 packs per day. He has never used smokeless tobacco. [name] reports no history of alcohol use.   Review of Systems CONSTITUTIONAL: No weight loss, fever, chills, weakness or fatigue.  HEENT: Eyes: No visual loss, blurred vision, double vision or yellow sclerae.No hearing loss, sneezing, congestion, runny nose or sore throat.  SKIN: No rash or itching.  CARDIOVASCULAR: per hpi RESPIRATORY: No shortness of breath, cough or sputum.  GASTROINTESTINAL: No anorexia, nausea, vomiting or diarrhea. No abdominal pain or blood.  GENITOURINARY: No burning on urination, no polyuria NEUROLOGICAL: No headache, dizziness, syncope, paralysis, ataxia, numbness or tingling in the extremities. No change in bowel or  bladder control.  MUSCULOSKELETAL: No muscle, back pain, joint pain or stiffness.  LYMPHATICS: No enlarged nodes. No history of splenectomy.  PSYCHIATRIC: No history of depression or anxiety.  ENDOCRINOLOGIC: No reports of sweating, cold or heat intolerance. No polyuria or polydipsia.  Marland Kitchen   Physical Examination Today's Vitals   07/22/22 1130  BP: 118/64  Pulse: 69  SpO2: 93%  Weight: 218 lb 12.8 oz (99.2 kg)  Height: 6\' 1"  (1.854 m)   Body mass index is 28.87 kg/m.  Gen: resting comfortably, no acute distress HEENT: no scleral icterus, pupils equal round and reactive, no palptable cervical adenopathy,  CV: RRR, 2/6 systolic murmur rusb, no jvd Resp: Clear to auscultation bilaterally GI: abdomen is soft, non-tender, non-distended, normal bowel sounds, no hepatosplenomegaly MSK: extremities are warm, no edema.  Skin: warm, no rash Neuro:  no focal deficits Psych: appropriate affect   Diagnostic Studies 09/2016 echo Study Conclusions   - Left ventricle: The cavity size was normal. Wall thickness was   normal. Systolic function was at the lower limits of normal. The   estimated ejection fraction was in the range of 50% to 55%.   Doppler parameters are consistent with abnormal left ventricular   relaxation (grade 1 diastolic dysfunction). Doppler parameters   are consistent with high ventricular filling pressure. - Regional wall motion abnormality: Mild hypokinesis of the   basal-mid inferior and mid inferolateral myocardium. - Aortic valve: Moderately to severely calcified annulus.   Moderately thickened, moderately calcified leaflets. There was   moderate to severe stenosis. There was mild to moderate   regurgitation. Peak velocity (S): 377 cm/s. Mean gradient (S): 30   mm Hg. Valve area (VTI): 0.83 cm^2. Valve area (Vmax): 0.78 cm^2.   Valve area (Vmean): 0.8 cm^2. - Aorta: Mild aortic root dilatation. Aortic root dimension: 40 mm   (ED). - Mitral valve: Moderately to  severely calcified annulus. - Left atrium: The atrium was severely dilated.     07/2017 echo   Study Conclusions   - Left ventricle: The cavity size was normal. Wall thickness was   normal. Systolic function was normal. The estimated ejection   fraction was in the range of 55% to 60%. Wall motion was normal;   there were no regional wall motion abnormalities. Doppler   parameters are consistent with abnormal left ventricular   relaxation (grade 1 diastolic dysfunction). Doppler parameters   are consistent with high ventricular filling pressure. - Aortic valve: Moderately to severely calcified annulus.   Trileaflet; moderately calcified leaflets. There was moderate   stenosis. There was moderate regurgitation. Peak velocity (S):   323 cm/s. Mean gradient (S): 22 mm Hg. Valve area (VTI): 1.3  cm^2. Valve area (Vmax): 1.1 cm^2. Valve area (Vmean): 1.09 cm^2. - Mitral valve: Calcified annulus.   08/2018 echo IMPRESSIONS      1. The left ventricle has normal systolic function, with an ejection fraction of 55-60%. The cavity size was normal. There is mildly increased left ventricular wall thickness. Left ventricular diastolic Doppler parameters are consistent with impaired  relaxation. Possible basal inferolateral hypokinesis .  2. The right ventricle has normal systolic function. The cavity was normal. There is no increase in right ventricular wall thickness. Right ventricular systolic pressure normal with an estimated pressure of 9.2 mmHg.  3. The aortic valve is functionally bicuspid. Moderate calcification of the aortic valve. Aortic valve regurgitation is mild by color flow Doppler. Moderate stenosis of the aortic valve. LVOT/AV VTI 0.37 and AVA approximately 1.2 cm2. Mild aortic  annular calcification noted.  4. The mitral valve is degenerative. Mild thickening of the mitral valve leaflet. There is moderate mitral annular calcification present.  5. The tricuspid valve is grossly  normal.   09/2019 echo 1. Left ventricular ejection fraction, by estimation, is 60 to 65%. The  left ventricle has normal function. The left ventricle has no regional  wall motion abnormalities. There is severe concentric left ventricular  hypertrophy. Left ventricular diastolic   parameters are consistent with Grade I diastolic dysfunction (impaired  relaxation). Elevated left ventricular end-diastolic pressure.   2. Right ventricular systolic function is low normal. The right  ventricular size is normal.   3. The mitral valve is degenerative. No evidence of mitral valve  regurgitation. Mild mitral stenosis.   4. Aortic valve leaflets were poorly visualized. Overall, aortic stenosis  appeared moderate in severity.. The aortic valve has an indeterminant  number of cusps. Aortic valve regurgitation is mild. Moderate aortic valve  stenosis.      12/2020 echo IMPRESSIONS     1. Hypokinesis of the inferolateral wall (base, mid), inferior wall  (base).. Left ventricular ejection fraction, by estimation, is 50 to 55%.  The left ventricle has low normal function. The left ventricle has no  regional wall motion abnormalities. Left  ventricular diastolic parameters are consistent with Grade II diastolic  dysfunction (pseudonormalization). Elevated left atrial pressure.   2. Right ventricular systolic function is normal. The right ventricular  size is normal.   3. The mitral valve is abnormal. Trivial mitral valve regurgitation.   4. AV is not well visualized. Peak and mean gradients through the valve  are 38 and 22 mm Hg respectively Dimensionless index is 0.37. Overall  consistent with moderate AS. Compared to echo from 2021 Mean gradient is  increased (14 to 22 mm now) and  dimensionless index is decreased. . The aortic valve is abnormal. Aortic  valve regurgitation is mild to moderate.   5. The inferior vena cava is normal in size with greater than 50%  respiratory variability,  suggesting right atrial pressure of 3 mmHg  01/2022 echo 1. Left ventricular ejection fraction, by estimation, is 55%. The left  ventricle has normal function. The left ventricle demonstrates regional  wall motion abnormalities (see scoring diagram/findings for description).  There is mild asymmetric left  ventricular hypertrophy of the septal segment. Left ventricular diastolic  parameters are consistent with Grade I diastolic dysfunction (impaired  relaxation). The average left ventricular global longitudinal strain is  -13.2 %. The global longitudinal  strain is abnormal.   2. RA-RV gradient 8 mmHg suggesting normal RVSP. Right ventricular  systolic function is normal. The right ventricular  size is normal.   3. The mitral valve is degenerative. Trivial mitral valve regurgitation.  Moderate mitral annular calcification.   4. The aortic valve is bicuspid. There is moderate calcification of the  aortic valve. Aortic valve regurgitation is moderate. Moderate to severe  aortic valve stenosis. Aortic valve area, by VTI measures 1.11 cm. Aortic  valve mean gradient measures 24.0   mmHg. Dimentionless index 0.44.   5. Aortic dilatation noted. There is mild dilatation of the aortic root,  measuring 42 mm. There is mild dilatation of the ascending aorta,  measuring 39 mm.   6. Unable to estimate CVP.     Assessment and Plan   1. Aortic stenosis - moderate AS by last echo - no symptoms, continue to monitor. Repeat echo after next visit summer 2024.      2. Hyperlipidemia -continue current meds, request pcp labs.    3. Aortic aneurysm - mild and stable, continue to monitor - getting regular CTs for his lung mass, continue to follow these results for the aneurysm.   4.HTN - at goal, continue current meds  EKG today SR, chronic ST/T changes   Arnoldo Lenis, M.D.

## 2022-07-22 NOTE — Patient Instructions (Addendum)
Medication Instructions:  Continue all current medications.   Labwork: none  Testing/Procedures: none  Follow-Up: 6 months   Any Other Special Instructions Will Be Listed Below (If Applicable).   If you need a refill on your cardiac medications before your next appointment, please call your pharmacy.  

## 2022-08-07 ENCOUNTER — Encounter (HOSPITAL_COMMUNITY): Payer: Self-pay | Admitting: Emergency Medicine

## 2022-08-07 ENCOUNTER — Other Ambulatory Visit: Payer: Self-pay

## 2022-08-07 ENCOUNTER — Emergency Department (HOSPITAL_COMMUNITY): Payer: Medicare HMO

## 2022-08-07 ENCOUNTER — Emergency Department (HOSPITAL_COMMUNITY)
Admission: EM | Admit: 2022-08-07 | Discharge: 2022-08-08 | Disposition: A | Discharge status: Home / Self Care | Payer: Medicare HMO | Source: Home / Self Care | Attending: Emergency Medicine | Admitting: Emergency Medicine

## 2022-08-07 ENCOUNTER — Encounter (HOSPITAL_COMMUNITY): Payer: Self-pay

## 2022-08-07 ENCOUNTER — Emergency Department (HOSPITAL_COMMUNITY)
Admission: EM | Admit: 2022-08-07 | Discharge: 2022-08-07 | Disposition: A | Discharge status: Home / Self Care | Payer: Medicare HMO | Attending: Emergency Medicine | Admitting: Emergency Medicine

## 2022-08-07 DIAGNOSIS — F17210 Nicotine dependence, cigarettes, uncomplicated: Secondary | ICD-10-CM | POA: Insufficient documentation

## 2022-08-07 DIAGNOSIS — K920 Hematemesis: Secondary | ICD-10-CM | POA: Insufficient documentation

## 2022-08-07 DIAGNOSIS — K5730 Diverticulosis of large intestine without perforation or abscess without bleeding: Secondary | ICD-10-CM | POA: Diagnosis not present

## 2022-08-07 DIAGNOSIS — R0602 Shortness of breath: Secondary | ICD-10-CM | POA: Diagnosis not present

## 2022-08-07 DIAGNOSIS — E1140 Type 2 diabetes mellitus with diabetic neuropathy, unspecified: Secondary | ICD-10-CM | POA: Insufficient documentation

## 2022-08-07 DIAGNOSIS — R69 Illness, unspecified: Secondary | ICD-10-CM | POA: Diagnosis not present

## 2022-08-07 DIAGNOSIS — R0902 Hypoxemia: Secondary | ICD-10-CM | POA: Diagnosis not present

## 2022-08-07 DIAGNOSIS — Z743 Need for continuous supervision: Secondary | ICD-10-CM | POA: Diagnosis not present

## 2022-08-07 DIAGNOSIS — Z7982 Long term (current) use of aspirin: Secondary | ICD-10-CM | POA: Insufficient documentation

## 2022-08-07 DIAGNOSIS — R042 Hemoptysis: Secondary | ICD-10-CM

## 2022-08-07 DIAGNOSIS — N200 Calculus of kidney: Secondary | ICD-10-CM | POA: Diagnosis not present

## 2022-08-07 DIAGNOSIS — J439 Emphysema, unspecified: Secondary | ICD-10-CM | POA: Diagnosis not present

## 2022-08-07 DIAGNOSIS — R0789 Other chest pain: Secondary | ICD-10-CM | POA: Diagnosis not present

## 2022-08-07 DIAGNOSIS — N179 Acute kidney failure, unspecified: Secondary | ICD-10-CM | POA: Insufficient documentation

## 2022-08-07 DIAGNOSIS — J449 Chronic obstructive pulmonary disease, unspecified: Secondary | ICD-10-CM | POA: Diagnosis not present

## 2022-08-07 LAB — COMPREHENSIVE METABOLIC PANEL
ALT: 20 U/L (ref 0–44)
AST: 23 U/L (ref 15–41)
Albumin: 3.1 g/dL — ABNORMAL LOW (ref 3.5–5.0)
Alkaline Phosphatase: 63 U/L (ref 38–126)
Anion gap: 9 (ref 5–15)
BUN: 36 mg/dL — ABNORMAL HIGH (ref 8–23)
CO2: 21 mmol/L — ABNORMAL LOW (ref 22–32)
Calcium: 8.9 mg/dL (ref 8.9–10.3)
Chloride: 108 mmol/L (ref 98–111)
Creatinine, Ser: 2.25 mg/dL — ABNORMAL HIGH (ref 0.61–1.24)
GFR, Estimated: 29 mL/min — ABNORMAL LOW (ref >60)
Glucose, Bld: 280 mg/dL — ABNORMAL HIGH (ref 70–99)
Potassium: 4.5 mmol/L (ref 3.5–5.1)
Sodium: 138 mmol/L (ref 135–145)
Total Bilirubin: 0.4 mg/dL (ref 0.3–1.2)
Total Protein: 6.9 g/dL (ref 6.5–8.1)

## 2022-08-07 LAB — CBC WITH DIFFERENTIAL/PLATELET
Abs Immature Granulocytes: 0.09 10*3/uL — ABNORMAL HIGH (ref 0.00–0.07)
Abs Immature Granulocytes: 0.15 10*3/uL — ABNORMAL HIGH (ref 0.00–0.07)
Basophils Absolute: 0 10*3/uL (ref 0.0–0.1)
Basophils Absolute: 0.1 10*3/uL (ref 0.0–0.1)
Basophils Relative: 1 %
Basophils Relative: 1 %
Eosinophils Absolute: 0.2 10*3/uL (ref 0.0–0.5)
Eosinophils Absolute: 0.1 10*3/uL (ref 0.0–0.5)
Eosinophils Relative: 2 %
Eosinophils Relative: 2 %
HCT: 36.7 % — ABNORMAL LOW (ref 39.0–52.0)
HCT: 34.5 % — ABNORMAL LOW (ref 39.0–52.0)
Hemoglobin: 12.4 g/dL — ABNORMAL LOW (ref 13.0–17.0)
Hemoglobin: 11.7 g/dL — ABNORMAL LOW (ref 13.0–17.0)
Immature Granulocytes: 1 %
Immature Granulocytes: 2 %
Lymphocytes Relative: 20 %
Lymphocytes Relative: 21 %
Lymphs Abs: 1.4 10*3/uL (ref 0.7–4.0)
Lymphs Abs: 1.3 10*3/uL (ref 0.7–4.0)
MCH: 28.8 pg (ref 26.0–34.0)
MCH: 29.5 pg (ref 26.0–34.0)
MCHC: 33.8 g/dL (ref 30.0–36.0)
MCHC: 33.9 g/dL (ref 30.0–36.0)
MCV: 85.3 fL (ref 80.0–100.0)
MCV: 86.9 fL (ref 80.0–100.0)
Monocytes Absolute: 0.9 10*3/uL (ref 0.1–1.0)
Monocytes Absolute: 1 10*3/uL (ref 0.1–1.0)
Monocytes Relative: 14 %
Monocytes Relative: 16 %
Neutro Abs: 4.2 10*3/uL (ref 1.7–7.7)
Neutro Abs: 3.5 10*3/uL (ref 1.7–7.7)
Neutrophils Relative %: 62 %
Neutrophils Relative %: 58 %
Platelets: 246 10*3/uL (ref 150–400)
Platelets: 257 10*3/uL (ref 150–400)
RBC: 4.3 MIL/uL (ref 4.22–5.81)
RBC: 3.97 MIL/uL — ABNORMAL LOW (ref 4.22–5.81)
RDW: 15.9 % — ABNORMAL HIGH (ref 11.5–15.5)
RDW: 15.9 % — ABNORMAL HIGH (ref 11.5–15.5)
WBC: 6.8 10*3/uL (ref 4.0–10.5)
WBC: 6.1 10*3/uL (ref 4.0–10.5)
nRBC: 0 % (ref 0.0–0.2)
nRBC: 0 % (ref 0.0–0.2)

## 2022-08-07 LAB — TROPONIN I (HIGH SENSITIVITY)
Troponin I (High Sensitivity): 57 ng/L — ABNORMAL HIGH (ref <18)
Troponin I (High Sensitivity): 62 ng/L — ABNORMAL HIGH (ref <18)

## 2022-08-07 LAB — BASIC METABOLIC PANEL
Anion gap: 11 (ref 5–15)
BUN: 30 mg/dL — ABNORMAL HIGH (ref 8–23)
CO2: 20 mmol/L — ABNORMAL LOW (ref 22–32)
Calcium: 9.5 mg/dL (ref 8.9–10.3)
Chloride: 104 mmol/L (ref 98–111)
Creatinine, Ser: 2.05 mg/dL — ABNORMAL HIGH (ref 0.61–1.24)
GFR, Estimated: 32 mL/min — ABNORMAL LOW (ref >60)
Glucose, Bld: 176 mg/dL — ABNORMAL HIGH (ref 70–99)
Potassium: 4.7 mmol/L (ref 3.5–5.1)
Sodium: 135 mmol/L (ref 135–145)

## 2022-08-07 LAB — LIPASE, BLOOD: Lipase: 94 U/L — ABNORMAL HIGH (ref 11–51)

## 2022-08-07 LAB — PROTIME-INR
INR: 1 (ref 0.8–1.2)
Prothrombin Time: 13.5 seconds (ref 11.4–15.2)

## 2022-08-07 MED ORDER — ONDANSETRON HCL 4 MG/2ML IJ SOLN
4.0000 mg | Freq: Once | INTRAMUSCULAR | Status: AC
  Administered 2022-08-07: 4 mg via INTRAVENOUS
  Filled 2022-08-07: qty 2

## 2022-08-07 MED ORDER — IOHEXOL 350 MG/ML SOLN
75.0000 mL | Freq: Once | INTRAVENOUS | Status: AC | PRN
  Administered 2022-08-07: 60 mL via INTRAVENOUS

## 2022-08-07 MED ORDER — SODIUM CHLORIDE 0.9 % IV BOLUS
1000.0000 mL | Freq: Once | INTRAVENOUS | Status: AC
  Administered 2022-08-07: 1000 mL via INTRAVENOUS

## 2022-08-07 NOTE — ED Notes (Signed)
Pt returned from CT °

## 2022-08-07 NOTE — ED Notes (Signed)
Pt to CT scan.

## 2022-08-07 NOTE — ED Provider Notes (Signed)
8:45 AM Care of the patient assumed@.  We were awaiting a second troponin value.  This is returned, and is marginally increased compared to prior, but not substantially so.  I discussed this finding with the patient, and in the context of CKD, known aortic valve insufficiency, and with no ongoing chest pain, nonischemic EKG, little evidence for NSTEMI versus acute cardiac syndrome.  Patient's hemoptysis has resolved entirely, he has no ongoing complaints, is hemodynamically unremarkable.  We discussed admission for monitoring versus outpatient follow-up tomorrow which she states that he is capable of doing, and would like to do.  Return precautions also discussed at length.  Patient discharged to follow-up tomorrow with his physician.   Carmin Muskrat, MD 08/07/22 [phone]

## 2022-08-07 NOTE — ED Triage Notes (Signed)
Pt presents with hematemesis that started last night. Pt was seen here last night for same. Pt has no complaints other than worsening coughing up of blood.

## 2022-08-07 NOTE — ED Triage Notes (Signed)
Pt c/o coughing up blood since last night and some chest tightness. Pt states he had a sore throat last week. Denies blood thinners.

## 2022-08-07 NOTE — ED Provider Notes (Signed)
[facility]  Provider Note  CSN: [id number] Arrival date & time: 08/07/22 [id number]  History Chief Complaint  Patient presents with   Hemoptysis    Tristan Brooks is a 82 y.o. male with history of COPD and a hypermetabolic lung mass seen on PET in August 2023, unfortunately, it appears he was lost to follow up until December when he began seeing Pulmonology at [facility] in [place]. Had a bronchoscopy on 12/27 which appears to have shown candida but no malignancy. He reports he was treated with 2 weeks of antibiotics (doesn't remember the name) and on subsequent xray was clear. He has continued to have occasional cough, but no fevers. Last night he noticed some streaks of blood in his sputum which was still present when he woke up to go to the bathroom this morning and so he decided to come to the ED for evaluation. He reports some chest tightness. No fever or SOB. He continues to smoke occasionally. Not on a blood thinner or home oxygen.    Home Medications Prior to Admission medications   Medication Sig Start Date End Date Taking? Authorizing Provider  aspirin EC 81 MG tablet Take 1 tablet (81 mg total) by mouth daily. 11/22/17   Arnoldo Lenis, MD  atorvastatin (LIPITOR) 20 MG tablet Take 20 mg by mouth daily.    [provider]  Cholecalciferol (VITAMIN D-3) 25 MCG (1000 UT) CAPS Take 1 capsule by mouth daily.    [provider]  Ferrous Sulfate Dried (HIGH POTENCY IRON) 65 MG TABS Take 1 tablet by mouth daily.    [provider]  glipiZIDE (GLUCOTROL) 5 MG tablet Take 5 mg by mouth every morning. 04/09/21   [provider]  losartan (COZAAR) 25 MG tablet Take 12.5 mg by mouth daily. Patient not taking: Reported on 03/21/2022    [provider]  mirtazapine (REMERON) 15 MG tablet Take 15 mg by mouth at bedtime.    [provider]  pantoprazole (PROTONIX) 40 MG tablet Take 40 mg by mouth  daily. Patient not taking: Reported on 07/22/2022    [provider]  pioglitazone (ACTOS) 30 MG tablet Take 30 mg by mouth daily.    [provider]     Allergies    Patient has no known allergies.   Review of Systems   Review of Systems Please see HPI for pertinent positives and negatives  Physical Exam BP (!) 141/81   Pulse 67   Temp 98.5 F (36.9 C) (Oral)   Resp 18   Ht 6' 1"$  (1.854 m)   Wt 99.2 kg   SpO2 95%   BMI 28.87 kg/m   Physical Exam Vitals and nursing note reviewed.  Constitutional:      Appearance: Normal appearance.  HENT:     Head: Normocephalic and atraumatic.     Nose: Nose normal.     Mouth/Throat:     Mouth: Mucous membranes are moist.  Eyes:     Extraocular Movements: Extraocular movements intact.     Conjunctiva/sclera: Conjunctivae normal.  Cardiovascular:     Rate and Rhythm: Normal rate.  Pulmonary:     Effort: Pulmonary effort is normal.     Breath sounds: Wheezing (occasional, mild end expiratory) present.  Abdominal:     General: Abdomen is flat.     Palpations: Abdomen is soft.     Tenderness: There is no abdominal tenderness.  Musculoskeletal:  General: No swelling. Normal range of motion.     Cervical back: Neck supple.     Right lower leg: No edema.     Left lower leg: No edema.  Skin:    General: Skin is warm and dry.  Neurological:     General: No focal deficit present.     Mental Status: He is alert.  Psychiatric:        Mood and Affect: Mood normal.     ED Results / Procedures / Treatments   EKG EKG Interpretation  Date/Time:  Sunday August 07 2022 05:22:45 EST Ventricular Rate:  77 PR Interval:  189 QRS Duration: 132 QT Interval:  391 QTC Calculation: 443 R Axis:   -48 Text Interpretation: Sinus rhythm LVH with IVCD, LAD and secondary repol abnrm Baseline wander in lead(s) V4 Since last tracing IVCD is now present Confirmed by Calvert Cantor [phone]) on 08/07/2022 5:25:27  AM  Procedures Procedures  Medications Ordered in the ED Medications  iohexol (OMNIPAQUE) 350 MG/ML injection 75 mL (60 mLs Intravenous Contrast Given 08/07/22 0643)    Initial Impression and Plan  Patient here with mild hemoptysis. Has had complex history of lung disease recently. Will check labs, start with CXR but likely will need CT as well. Hemodynamically stable with normal SpO2.   ED Course   Clinical Course as of 08/07/22 0725  Sun Aug 07, 2022  0543 CBC with mild anemia, improved from previous.  [CS]  [id number] INR is normal.  [CS]  0600 I personally viewed the images from radiology studies and agree with radiologist interpretation: Persistent R lung mass. Will send for CTA to rule out PE  [CS]  0636 BMP shows CKD similar to previous.  [CS]  0700 Initial Trop is mildly elevated, no baseline to compare. Will await repeat to determine trend.  [CS]  [phone] Care of the patient signed out to Dr. Vanita Panda at the change of shift  [CS]    Clinical Course User Index [CS] Truddie Hidden, MD     MDM Rules/Calculators/A&P Medical Decision Making Problems Addressed: Hemoptysis: acute illness or injury  Amount and/or Complexity of Data Reviewed Labs: ordered. Decision-making details documented in ED Course. Radiology: ordered and independent interpretation performed. Decision-making details documented in ED Course. ECG/medicine tests: ordered and independent interpretation performed. Decision-making details documented in ED Course.  Risk Prescription drug management.     Final Clinical Impression(s) / ED Diagnoses Final diagnoses:  Hemoptysis    Rx / DC Orders ED Discharge Orders     None        Truddie Hidden, MD 08/07/22 [phone]

## 2022-08-07 NOTE — Discharge Instructions (Signed)
As discussed, with today's episode of coughing up blood, and your known lung lesion it is very important that you follow-up with your physician tomorrow.  Similarly important is that you return here if you develop new, or concerning changes at all.

## 2022-08-07 NOTE — ED Notes (Signed)
Informed provider of trop level

## 2022-08-08 ENCOUNTER — Emergency Department (HOSPITAL_COMMUNITY): Payer: Medicare HMO

## 2022-08-08 DIAGNOSIS — N200 Calculus of kidney: Secondary | ICD-10-CM | POA: Diagnosis not present

## 2022-08-08 DIAGNOSIS — K5730 Diverticulosis of large intestine without perforation or abscess without bleeding: Secondary | ICD-10-CM | POA: Diagnosis not present

## 2022-08-08 LAB — POC OCCULT BLOOD, ED: Fecal Occult Bld: NEGATIVE

## 2022-08-08 NOTE — Discharge Instructions (Signed)
You were evaluated in the Emergency Department and after careful evaluation, we did not find any emergent condition requiring admission or further testing in the hospital.  Your exam/testing today is overall reassuring.  Recommend close follow-up with your primary care doctor to discuss your symptoms.  Increase your fluid intake at home as we discussed.  Please return to the Emergency Department if you experience any worsening of your condition.   Thank you for allowing [place] to be a part of your care.

## 2022-08-08 NOTE — ED Provider Notes (Signed)
[facility] Emergency Department Provider Note MRN:  [id number]  Arrival date & time: 08/08/22     Chief Complaint   Hematemesis   History of Present Illness   Tristan Brooks is a 82 y.o. year-old male with a history of diabetes presenting to the ED with chief complaint of hematemesis.  Patient vomited blood this evening.  Explains that he did not cough the blood up, he vomited the blood.  But there was not any vomit in the blood, it was pure blood.  Not having any chest pain or shortness of breath.  Having some intermittent abdominal pain recently.  Thinks maybe his stool has been bloody or black recently.  No lightheadedness.  No blood thinners.  Review of Systems  A thorough review of systems was obtained and all systems are negative except as noted in the HPI and PMH.   Patient's Health History    Past Medical History:  Diagnosis Date   Diabetes mellitus without complication ([place])    Neuropathy     Past Surgical History:  Procedure Laterality Date   ROTATOR CUFF REPAIR Right    TOTAL KNEE ARTHROPLASTY      Family History  Problem Relation Age of Onset   Heart Problems Father    Heart attack Sister     Social History   Socioeconomic History   Marital status: Widowed    Spouse name: Not on file   Number of children: Not on file   Years of education: Not on file   Highest education level: Not on file  Occupational History   Not on file  Tobacco Use   Smoking status: Some Days    Packs/day: 0.10    Types: Cigarettes   Smokeless tobacco: Never   Tobacco comments:    4-5 ciggs per day  Substance and Sexual Activity   Alcohol use: No   Drug use: Not on file   Sexual activity: Not on file  Other Topics Concern   Not on file  Social History Narrative   Not on file   Social Determinants of Health   Financial Resource Strain: Not on file  Food Insecurity: Food Insecurity Present (02/01/2022)   Hunger Vital Sign    Worried About  Running Out of Food in the Last Year: Sometimes true    Ran Out of Food in the Last Year: Sometimes true  Transportation Needs: No Transportation Needs (02/01/2022)   PRAPARE - Hydrologist (Medical): No    Lack of Transportation (Non-Medical): No  Physical Activity: Not on file  Stress: Not on file  Social Connections: Not on file  Intimate Partner Violence: Not on file     Physical Exam   Vitals:   08/07/22 2110 08/08/22 0000  BP: 106/87 (!) 152/82  Pulse: 93 66  Resp: 18 18  Temp: 98.9 F (37.2 C)   SpO2: 92% 94%    CONSTITUTIONAL: Well-appearing, NAD NEURO/PSYCH:  Alert and oriented x 3, no focal deficits EYES:  eyes equal and reactive ENT/NECK:  no LAD, no JVD CARDIO: Regular rate, well-perfused, normal S1 and S2 PULM:  CTAB no wheezing or rhonchi GI/GU:  non-distended, non-tender MSK/SPINE:  No gross deformities, no edema SKIN:  no rash, atraumatic   *Additional and/or pertinent findings included in MDM below  Diagnostic and Interventional Summary    EKG Interpretation  Date/Time:    Ventricular Rate:    PR Interval:    QRS Duration:   QT  Interval:    QTC Calculation:   R Axis:     Text Interpretation:         Labs Reviewed  CBC WITH DIFFERENTIAL/PLATELET - Abnormal; Notable for the following components:      Result Value   RBC 3.97 (*)    Hemoglobin 11.7 (*)    HCT 34.5 (*)    RDW 15.9 (*)    Abs Immature Granulocytes 0.15 (*)    All other components within normal limits  COMPREHENSIVE METABOLIC PANEL - Abnormal; Notable for the following components:   CO2 21 (*)    Glucose, Bld 280 (*)    BUN 36 (*)    Creatinine, Ser 2.25 (*)    Albumin 3.1 (*)    GFR, Estimated 29 (*)    All other components within normal limits  LIPASE, BLOOD - Abnormal; Notable for the following components:   Lipase 94 (*)    All other components within normal limits  POC OCCULT BLOOD, ED    CT ABDOMEN PELVIS WO CONTRAST  Final Result     DG Chest Port 1 View  Final Result      Medications  sodium chloride 0.9 % bolus 1,000 mL (0 mLs Intravenous Stopped 08/08/22 0026)  ondansetron (ZOFRAN) injection 4 mg (4 mg Intravenous Given 08/07/22 2117)     Procedures  /  Critical Care Procedures  ED Course and Medical Decision Making  Initial Impression and Ddx Question hematemesis versus hemoptysis.  Just had a thorough evaluation for hemoptysis yesterday with a reassuring PE study.  Tonight he thinks it is hematemesis.  Asked him multiple times to pinpoint this.  Bit of a difficult historian.  Reassuring vital signs, abdomen soft and nontender.  Question GI bleeding, will obtain Hemoccult and reassess.  Past medical/surgical history that increases complexity of ED encounter: Diabetes  Interpretation of Diagnostics I personally reviewed the laboratory assessment and my interpretation is as follows: No significant blood count or electrolyte disturbance  CT imaging is without acute process  Patient Reassessment and Ultimate Disposition/Management     Patient is Hemoccult negative, no bleeding episodes here in the emergency department, appropriate for discharge.  Mild acute kidney injury, more fluids at home encouraged.  He will have his numbers rechecked with PCP.  Patient management required discussion with the following services or consulting groups:  None  Complexity of Problems Addressed Acute illness or injury that poses threat of life of bodily function  Additional Data Reviewed and Analyzed Further history obtained from: Past medical history and medications listed in the EMR and Prior ED visit notes  Additional Factors Impacting ED Encounter Risk None  Barth Kirks. Sedonia Small, [place] [email or web address]  Final Clinical Impressions(s) / ED Diagnoses     ICD-10-CM   1. Hematemesis, unspecified whether nausea present  K92.0     2. AKI (acute kidney injury) ([place])   N17.9       ED Discharge Orders     None        Discharge Instructions Discussed with and Provided to Patient:     Discharge Instructions      You were evaluated in the Emergency Department and after careful evaluation, we did not find any emergent condition requiring admission or further testing in the hospital.  Your exam/testing today is overall reassuring.  Recommend close follow-up with your primary care doctor to discuss your symptoms.  Increase your fluid intake at home as we discussed.  Please  return to the Emergency Department if you experience any worsening of your condition.   Thank you for allowing [place] to be a part of your care.       Maudie Flakes, MD 08/08/22 [phone]

## 2022-08-10 ENCOUNTER — Other Ambulatory Visit (HOSPITAL_COMMUNITY)
Admission: RE | Admit: 2022-08-10 | Discharge: 2022-08-10 | Disposition: A | Discharge status: Home / Self Care | Payer: Medicare HMO | Source: Ambulatory Visit | Attending: Physician Assistant | Admitting: Physician Assistant

## 2022-08-10 ENCOUNTER — Ambulatory Visit
Admission: RE | Admit: 2022-08-10 | Discharge: 2022-08-10 | Disposition: A | Discharge status: Home / Self Care | Payer: Medicare HMO | Source: Ambulatory Visit | Attending: Otolaryngology | Admitting: Otolaryngology

## 2022-08-10 DIAGNOSIS — E041 Nontoxic single thyroid nodule: Secondary | ICD-10-CM

## 2022-08-11 ENCOUNTER — Telehealth: Payer: Self-pay

## 2022-08-11 NOTE — Telephone Encounter (Signed)
        Patient  visited [facility] on 2/19     Telephone encounter attempt :  1st  Unable to [place], [place] [phone] [address], [place], [place] [address] Phone: [phone] Email: [email or web address]

## 2022-08-12 LAB — CYTOLOGY - NON PAP

## 2022-08-13 DIAGNOSIS — R030 Elevated blood-pressure reading, without diagnosis of hypertension: Secondary | ICD-10-CM | POA: Diagnosis not present

## 2022-08-13 DIAGNOSIS — R69 Illness, unspecified: Secondary | ICD-10-CM | POA: Diagnosis not present

## 2022-08-13 DIAGNOSIS — R918 Other nonspecific abnormal finding of lung field: Secondary | ICD-10-CM | POA: Diagnosis not present

## 2022-08-13 DIAGNOSIS — R042 Hemoptysis: Secondary | ICD-10-CM | POA: Diagnosis not present

## 2022-08-13 DIAGNOSIS — Z6830 Body mass index (BMI) 30.0-30.9, adult: Secondary | ICD-10-CM | POA: Diagnosis not present

## 2022-08-18 DIAGNOSIS — D529 Folate deficiency anemia, unspecified: Secondary | ICD-10-CM | POA: Diagnosis not present

## 2022-08-18 DIAGNOSIS — D519 Vitamin B12 deficiency anemia, unspecified: Secondary | ICD-10-CM | POA: Diagnosis not present

## 2022-08-18 DIAGNOSIS — D649 Anemia, unspecified: Secondary | ICD-10-CM | POA: Diagnosis not present

## 2022-09-07 DIAGNOSIS — R69 Illness, unspecified: Secondary | ICD-10-CM | POA: Diagnosis not present

## 2022-09-07 DIAGNOSIS — R918 Other nonspecific abnormal finding of lung field: Secondary | ICD-10-CM | POA: Diagnosis not present

## 2022-09-07 DIAGNOSIS — Z716 Tobacco abuse counseling: Secondary | ICD-10-CM | POA: Diagnosis not present

## 2022-09-07 DIAGNOSIS — J181 Lobar pneumonia, unspecified organism: Secondary | ICD-10-CM | POA: Diagnosis not present

## 2022-09-07 DIAGNOSIS — R911 Solitary pulmonary nodule: Secondary | ICD-10-CM | POA: Diagnosis not present

## 2022-09-07 DIAGNOSIS — J431 Panlobular emphysema: Secondary | ICD-10-CM | POA: Diagnosis not present

## 2022-09-08 DIAGNOSIS — Z6830 Body mass index (BMI) 30.0-30.9, adult: Secondary | ICD-10-CM | POA: Diagnosis not present

## 2022-09-08 DIAGNOSIS — R69 Illness, unspecified: Secondary | ICD-10-CM | POA: Diagnosis not present

## 2022-09-08 DIAGNOSIS — R042 Hemoptysis: Secondary | ICD-10-CM | POA: Diagnosis not present

## 2022-09-08 DIAGNOSIS — R030 Elevated blood-pressure reading, without diagnosis of hypertension: Secondary | ICD-10-CM | POA: Diagnosis not present

## 2022-09-08 DIAGNOSIS — R918 Other nonspecific abnormal finding of lung field: Secondary | ICD-10-CM | POA: Diagnosis not present

## 2022-09-20 DIAGNOSIS — J181 Lobar pneumonia, unspecified organism: Secondary | ICD-10-CM | POA: Diagnosis not present

## 2022-09-20 DIAGNOSIS — R911 Solitary pulmonary nodule: Secondary | ICD-10-CM | POA: Diagnosis not present

## 2022-09-20 DIAGNOSIS — N183 Chronic kidney disease, stage 3 unspecified: Secondary | ICD-10-CM | POA: Diagnosis not present

## 2022-09-20 DIAGNOSIS — I129 Hypertensive chronic kidney disease with stage 1 through stage 4 chronic kidney disease, or unspecified chronic kidney disease: Secondary | ICD-10-CM | POA: Diagnosis not present

## 2022-09-20 DIAGNOSIS — E785 Hyperlipidemia, unspecified: Secondary | ICD-10-CM | POA: Diagnosis not present

## 2022-09-20 DIAGNOSIS — E1140 Type 2 diabetes mellitus with diabetic neuropathy, unspecified: Secondary | ICD-10-CM | POA: Diagnosis not present

## 2022-09-20 DIAGNOSIS — Z79899 Other long term (current) drug therapy: Secondary | ICD-10-CM | POA: Diagnosis not present

## 2022-09-20 DIAGNOSIS — Z7984 Long term (current) use of oral hypoglycemic drugs: Secondary | ICD-10-CM | POA: Diagnosis not present

## 2022-09-20 DIAGNOSIS — Z48812 Encounter for surgical aftercare following surgery on the circulatory system: Secondary | ICD-10-CM | POA: Diagnosis not present

## 2022-09-20 DIAGNOSIS — E1122 Type 2 diabetes mellitus with diabetic chronic kidney disease: Secondary | ICD-10-CM | POA: Diagnosis not present

## 2022-09-20 DIAGNOSIS — Z7982 Long term (current) use of aspirin: Secondary | ICD-10-CM | POA: Diagnosis not present

## 2022-09-20 DIAGNOSIS — Z8249 Family history of ischemic heart disease and other diseases of the circulatory system: Secondary | ICD-10-CM | POA: Diagnosis not present

## 2022-09-20 DIAGNOSIS — R69 Illness, unspecified: Secondary | ICD-10-CM | POA: Diagnosis not present

## 2022-09-20 DIAGNOSIS — D573 Sickle-cell trait: Secondary | ICD-10-CM | POA: Diagnosis not present

## 2022-09-20 DIAGNOSIS — R918 Other nonspecific abnormal finding of lung field: Secondary | ICD-10-CM | POA: Diagnosis not present

## 2022-09-20 DIAGNOSIS — J431 Panlobular emphysema: Secondary | ICD-10-CM | POA: Diagnosis not present

## 2022-09-23 DIAGNOSIS — J189 Pneumonia, unspecified organism: Secondary | ICD-10-CM | POA: Diagnosis not present

## 2022-09-23 DIAGNOSIS — R69 Illness, unspecified: Secondary | ICD-10-CM | POA: Diagnosis not present

## 2022-09-23 DIAGNOSIS — Z6830 Body mass index (BMI) 30.0-30.9, adult: Secondary | ICD-10-CM | POA: Diagnosis not present

## 2022-09-23 DIAGNOSIS — R030 Elevated blood-pressure reading, without diagnosis of hypertension: Secondary | ICD-10-CM | POA: Diagnosis not present

## 2022-09-23 DIAGNOSIS — N390 Urinary tract infection, site not specified: Secondary | ICD-10-CM | POA: Diagnosis not present

## 2022-09-28 DIAGNOSIS — J439 Emphysema, unspecified: Secondary | ICD-10-CM | POA: Diagnosis not present

## 2022-09-28 DIAGNOSIS — R918 Other nonspecific abnormal finding of lung field: Secondary | ICD-10-CM | POA: Diagnosis not present

## 2022-09-28 DIAGNOSIS — D0221 Carcinoma in situ of right bronchus and lung: Secondary | ICD-10-CM | POA: Diagnosis not present

## 2022-09-28 DIAGNOSIS — J984 Other disorders of lung: Secondary | ICD-10-CM | POA: Diagnosis not present

## 2022-09-28 DIAGNOSIS — I2510 Atherosclerotic heart disease of native coronary artery without angina pectoris: Secondary | ICD-10-CM | POA: Diagnosis not present

## 2022-10-06 IMAGING — US US EXTREM LOW VENOUS*L*
1 series · 14 of 24 positions shown · non-contrast
Comparison: None Available.

CLINICAL DATA: Left leg pain over the last 3 years

EXAM:
LEFT LOWER EXTREMITY VENOUS DOPPLER ULTRASOUND
TECHNIQUE: Gray-scale sonography with compression, as well as color and duplex
ultrasound, were performed to evaluate the deep venous system(s)
from the level of the common femoral vein through the popliteal and
proximal calf veins.

[Series 1: us venous img lower uni left (dvt) · portal-venous · 14 of 39 slices shown]
[im 1/39]
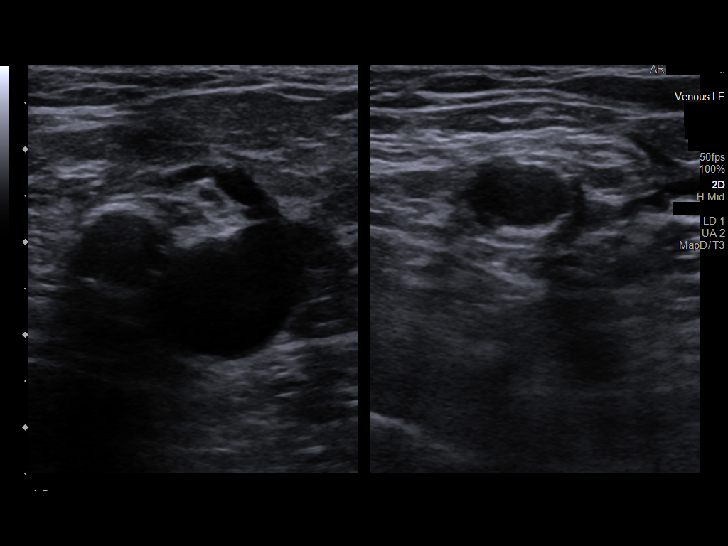
[im 4/39]
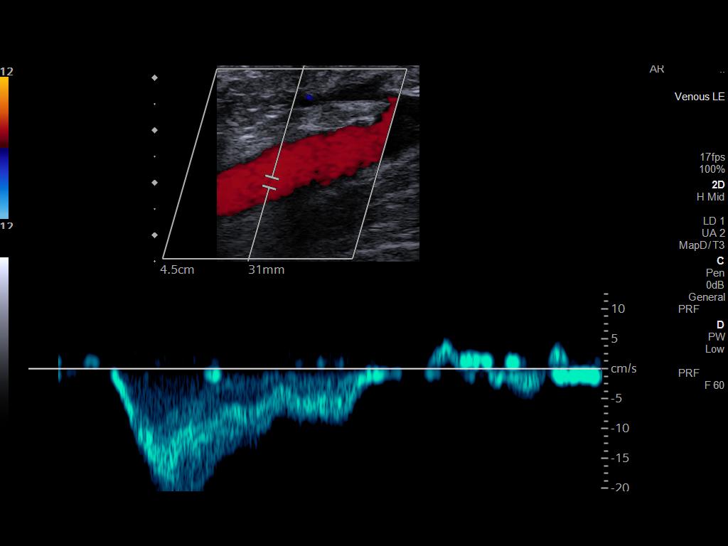
[im 7/39]
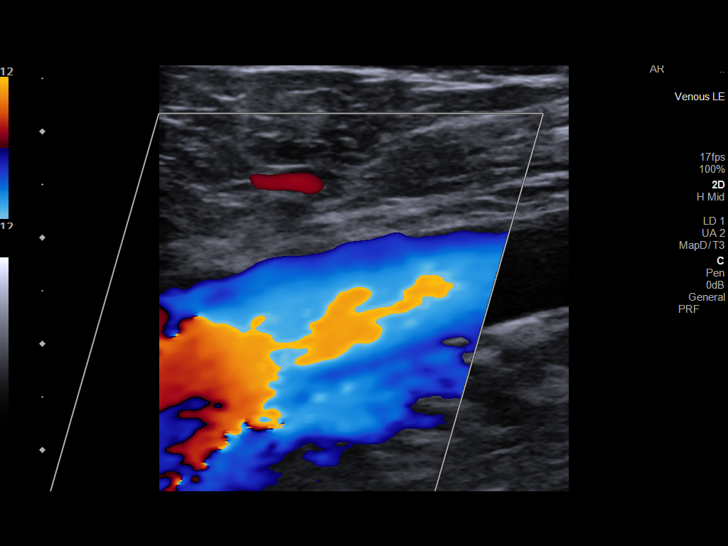
[im 10/39]
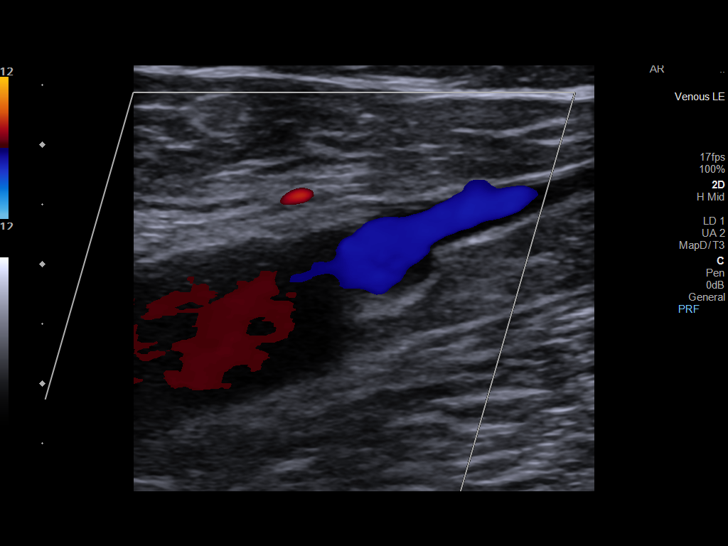
[im 12/39]
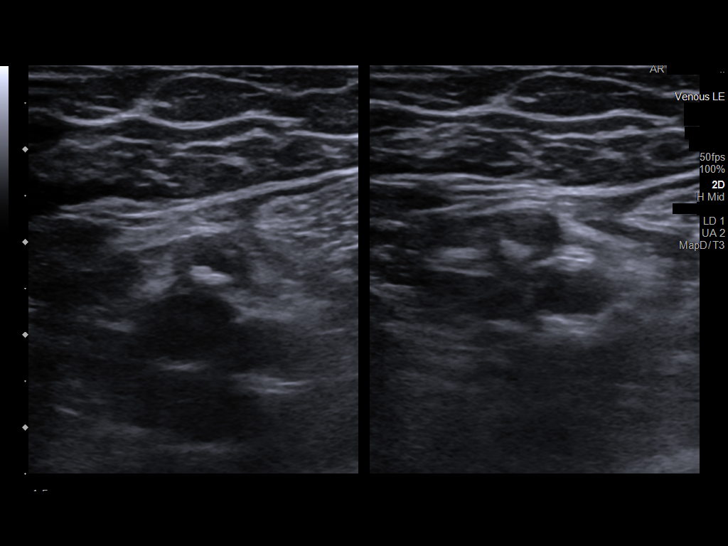
[im 15/39]
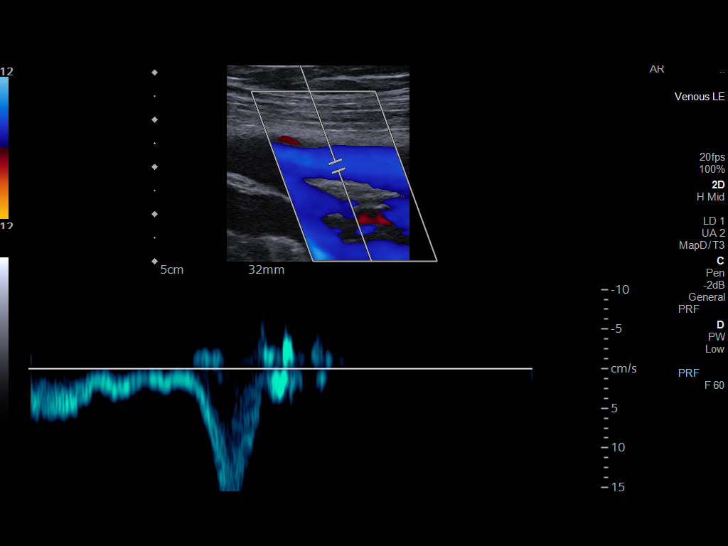
[im 19/39]
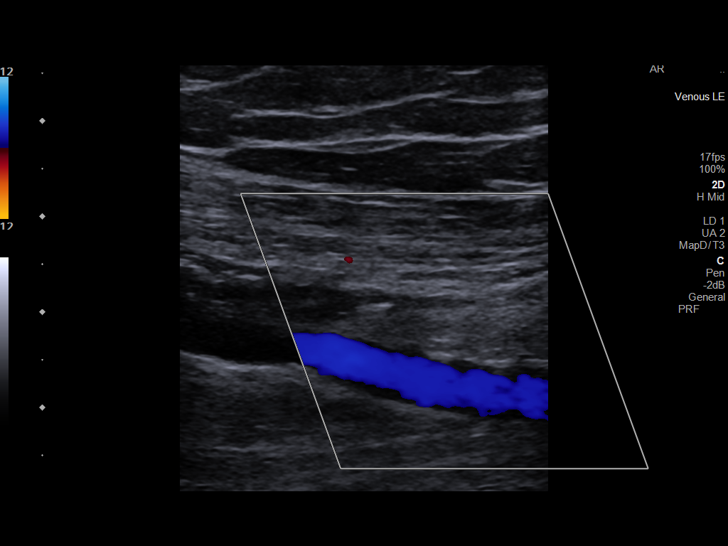
[im 20/39]
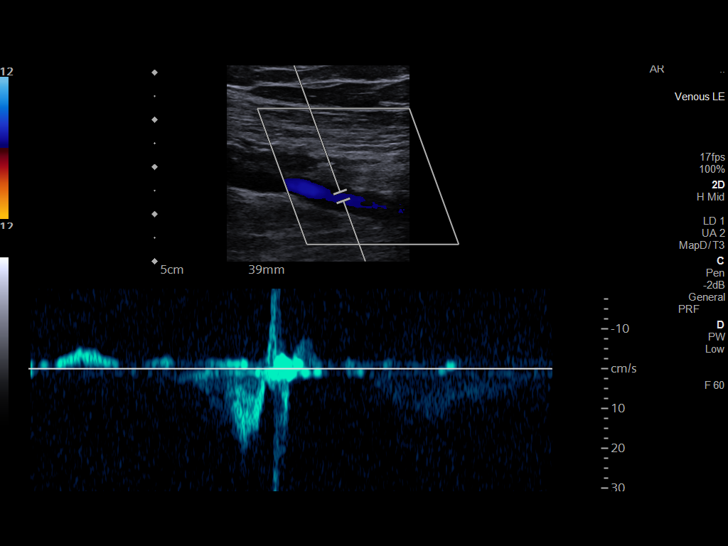
[im 24/39]
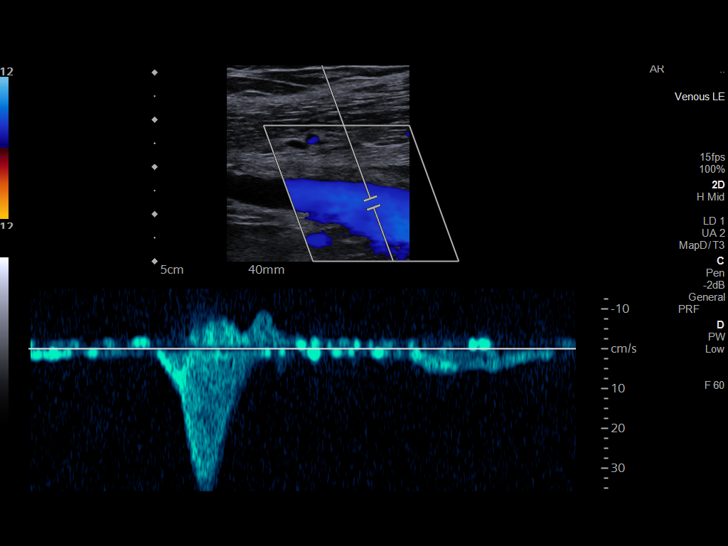
[im 27/39]
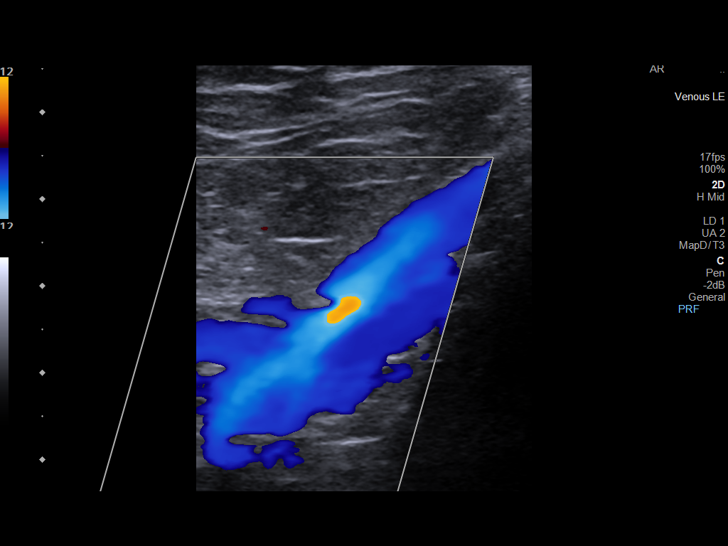
[im 30/39]
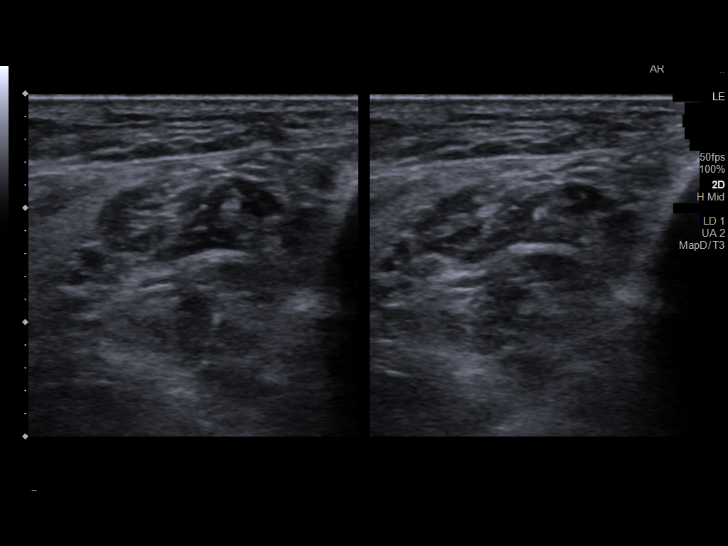
[im 32/39]
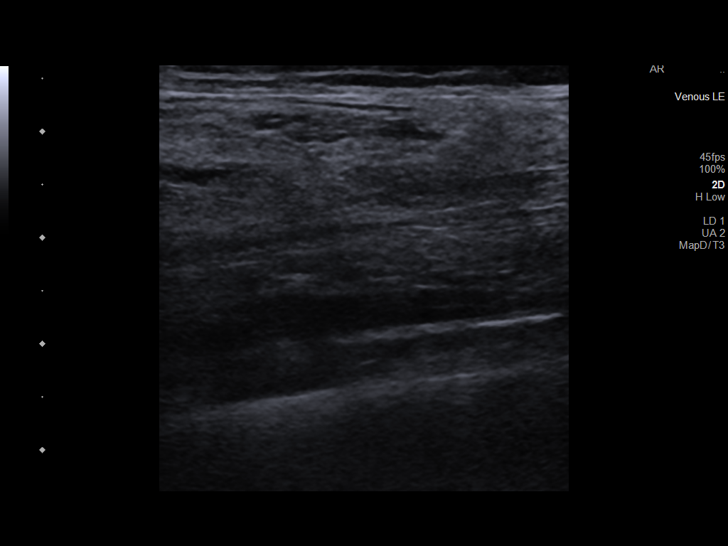
[im 35/39]
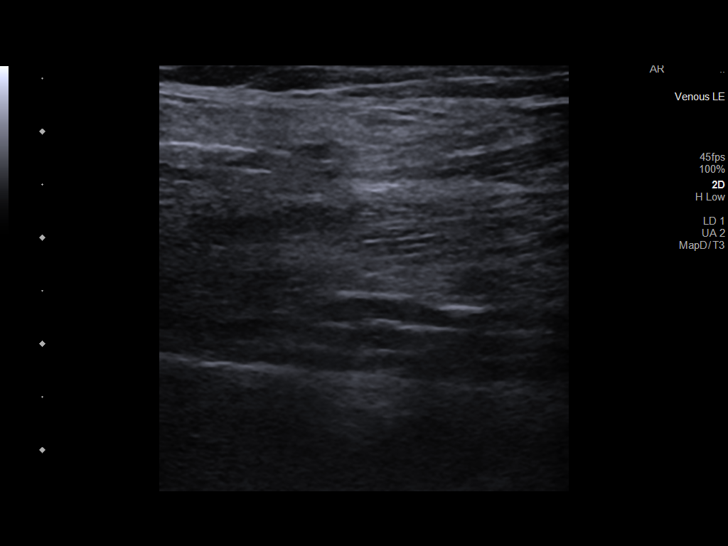
[im 39/39]
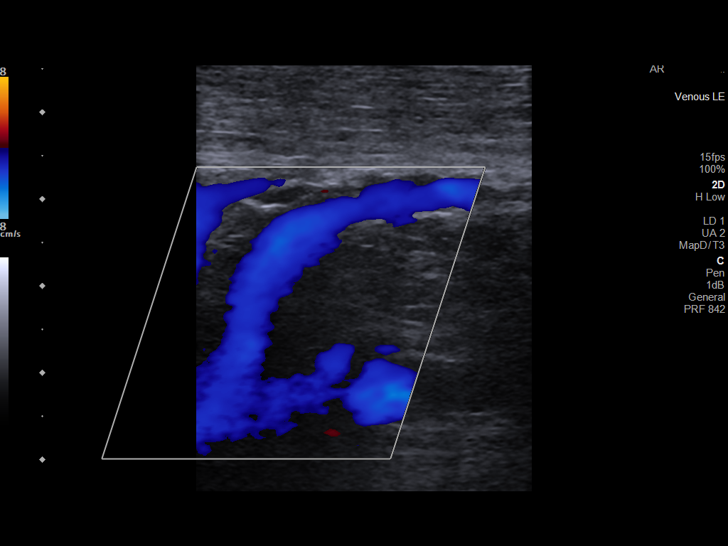

[14 of 24 positions shown; findings below may reference images not displayed]

FINDINGS: VENOUS

Normal compressibility of the common femoral, superficial femoral,
and popliteal veins, as well as the visualized calf veins.
Visualized portions of profunda femoral vein and great saphenous
vein unremarkable. No filling defects to suggest DVT on grayscale or
color Doppler imaging. Doppler waveforms show normal direction of
venous flow, normal respiratory plasticity and response to
augmentation.

Limited views of the contralateral common femoral vein are
unremarkable.

OTHER

None.

Limitations: none
IMPRESSION: Negative.

## 2022-10-06 IMAGING — DX DG KNEE COMPLETE 4+V*L*
4 series · 4 of 4 positions shown · non-contrast
Comparison: 09/09/2014

CLINICAL DATA: Nonlocalized left knee pain. Remote knee replacement
8 years ago.

EXAM:
LEFT KNEE - COMPLETE 4+ VIEW

[knee ap]
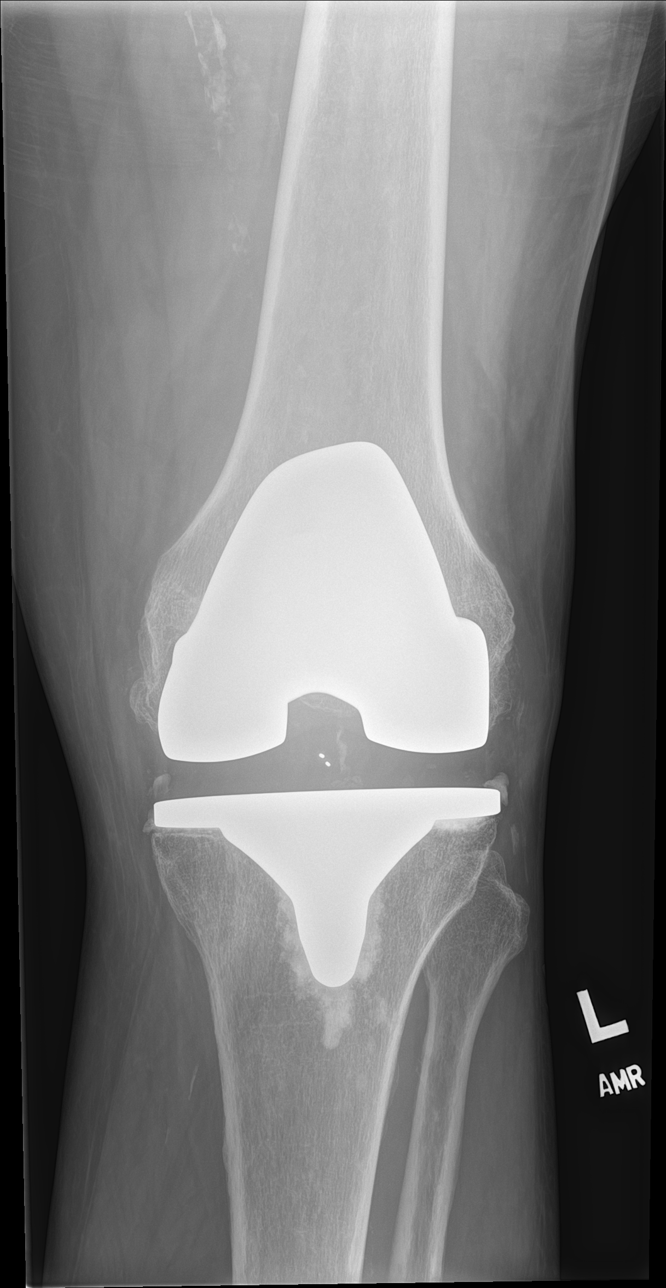

[knee obl (1 of 2)]
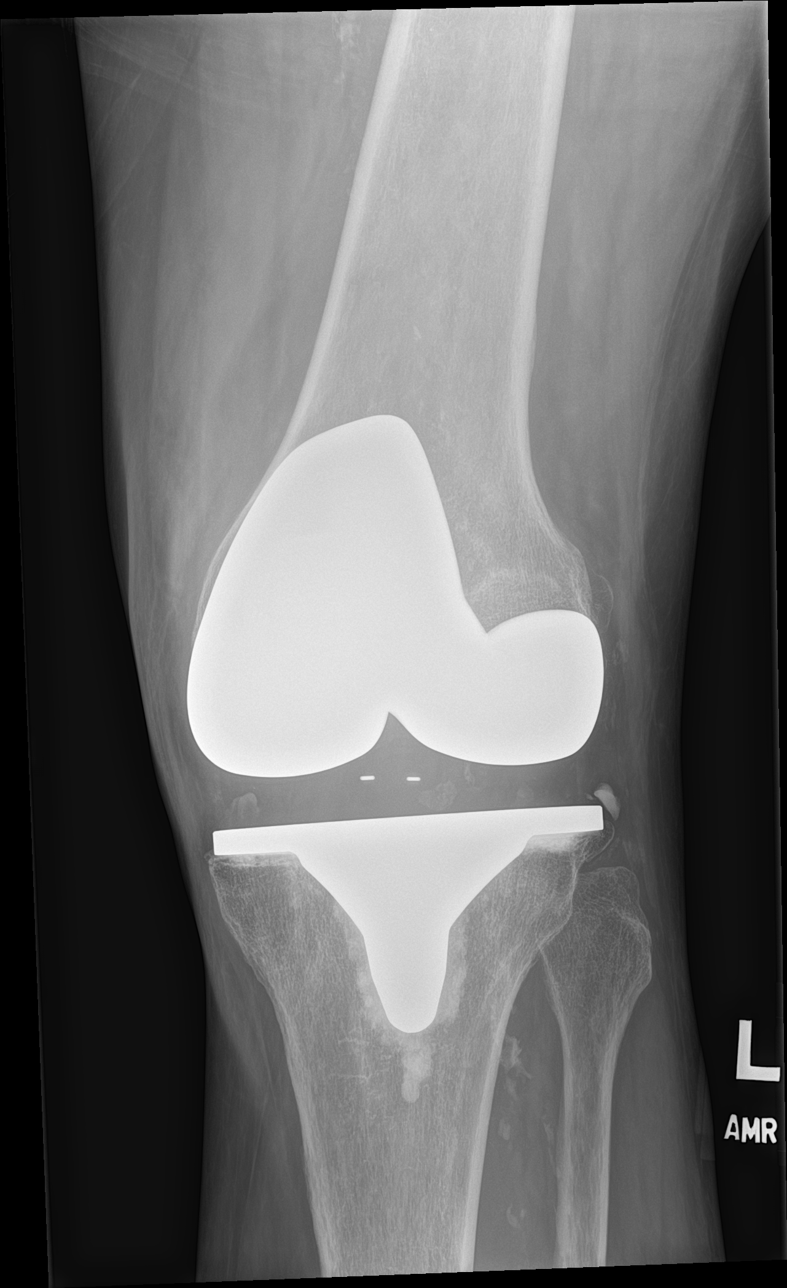

[knee obl (2 of 2)]
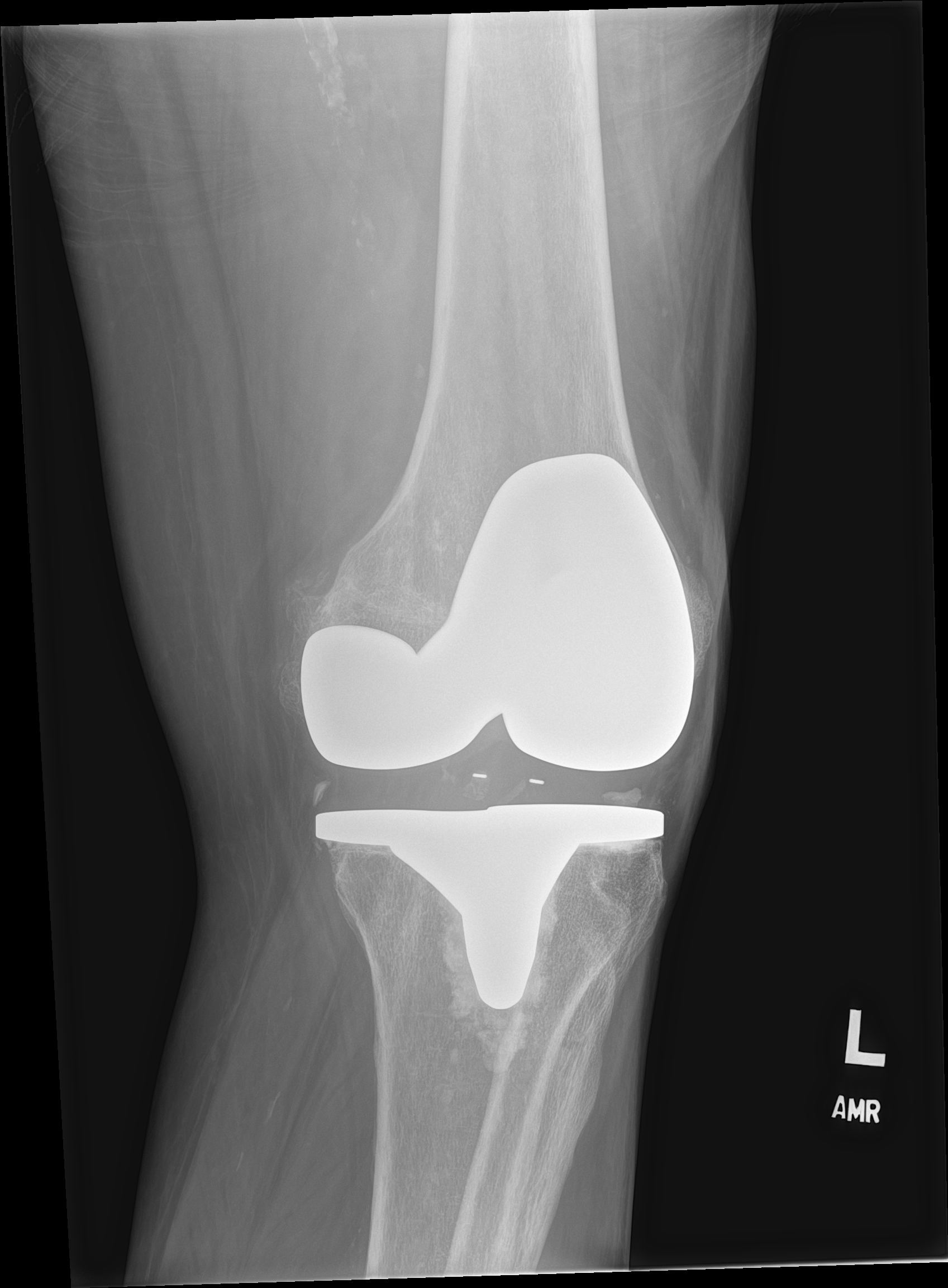

[knee lat]
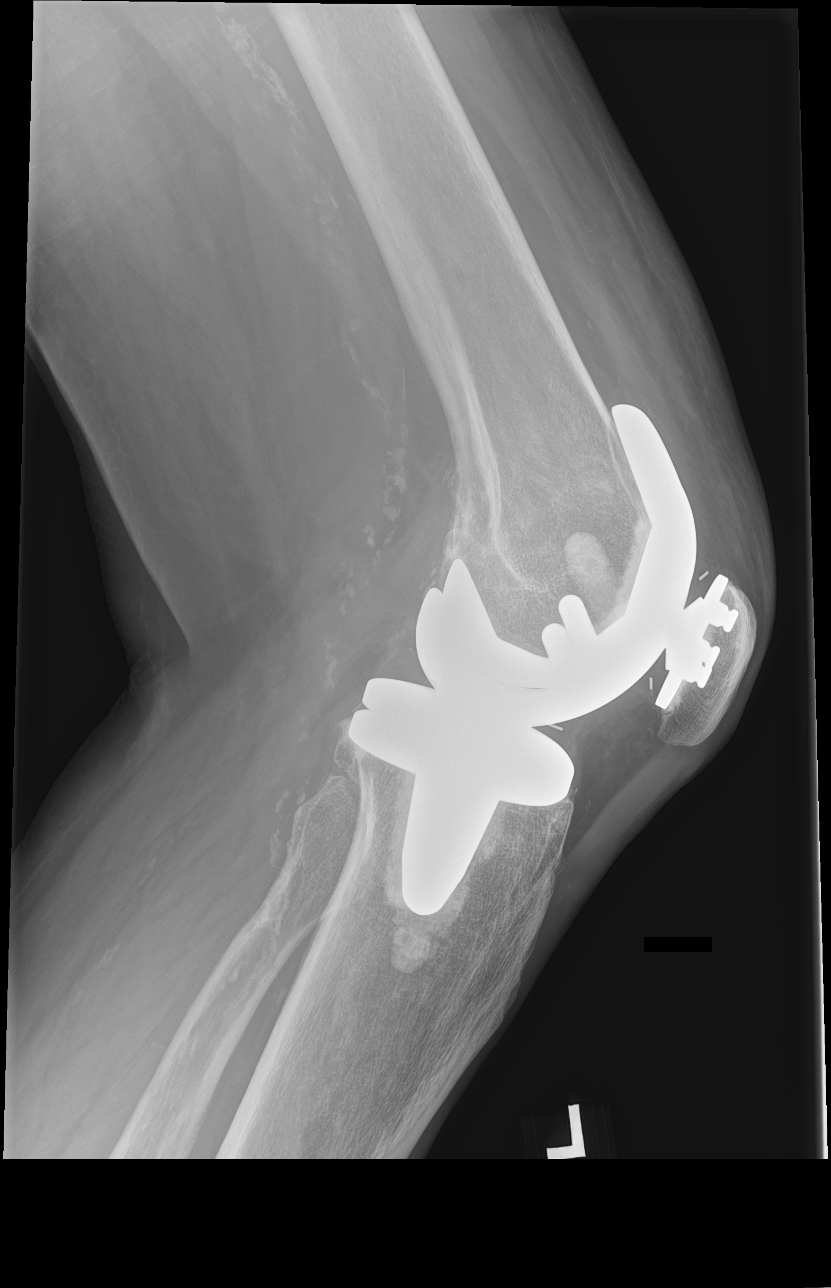

[4 of 4 positions shown; findings below may reference images not displayed]

FINDINGS: Left total knee arthroplasty changes noted. Stable appearing
hardware and alignment. No hardware or acute osseous abnormality.
Negative for fracture. No effusion. Bones are osteopenic. Peripheral
vascular calcifications.
IMPRESSION: Left total knee arthroplasty. No acute abnormality by plain
radiography.

## 2022-10-14 DIAGNOSIS — E1151 Type 2 diabetes mellitus with diabetic peripheral angiopathy without gangrene: Secondary | ICD-10-CM | POA: Diagnosis not present

## 2022-10-14 DIAGNOSIS — G4700 Insomnia, unspecified: Secondary | ICD-10-CM | POA: Diagnosis not present

## 2022-10-14 DIAGNOSIS — N529 Male erectile dysfunction, unspecified: Secondary | ICD-10-CM | POA: Diagnosis not present

## 2022-10-14 DIAGNOSIS — K5900 Constipation, unspecified: Secondary | ICD-10-CM | POA: Diagnosis not present

## 2022-10-14 DIAGNOSIS — M1990 Unspecified osteoarthritis, unspecified site: Secondary | ICD-10-CM | POA: Diagnosis not present

## 2022-10-14 DIAGNOSIS — E1122 Type 2 diabetes mellitus with diabetic chronic kidney disease: Secondary | ICD-10-CM | POA: Diagnosis not present

## 2022-10-14 DIAGNOSIS — J4489 Other specified chronic obstructive pulmonary disease: Secondary | ICD-10-CM | POA: Diagnosis not present

## 2022-10-14 DIAGNOSIS — M5440 Lumbago with sciatica, unspecified side: Secondary | ICD-10-CM | POA: Diagnosis not present

## 2022-10-14 DIAGNOSIS — N1832 Chronic kidney disease, stage 3b: Secondary | ICD-10-CM | POA: Diagnosis not present

## 2022-10-14 DIAGNOSIS — I129 Hypertensive chronic kidney disease with stage 1 through stage 4 chronic kidney disease, or unspecified chronic kidney disease: Secondary | ICD-10-CM | POA: Diagnosis not present

## 2022-10-14 DIAGNOSIS — E785 Hyperlipidemia, unspecified: Secondary | ICD-10-CM | POA: Diagnosis not present

## 2022-10-14 DIAGNOSIS — K219 Gastro-esophageal reflux disease without esophagitis: Secondary | ICD-10-CM | POA: Diagnosis not present

## 2022-10-20 DIAGNOSIS — E119 Type 2 diabetes mellitus without complications: Secondary | ICD-10-CM | POA: Diagnosis not present

## 2022-10-20 DIAGNOSIS — E7800 Pure hypercholesterolemia, unspecified: Secondary | ICD-10-CM | POA: Diagnosis not present

## 2022-10-20 DIAGNOSIS — Z1330 Encounter for screening examination for mental health and behavioral disorders, unspecified: Secondary | ICD-10-CM | POA: Diagnosis not present

## 2022-10-20 DIAGNOSIS — N189 Chronic kidney disease, unspecified: Secondary | ICD-10-CM | POA: Diagnosis not present

## 2022-10-20 DIAGNOSIS — K219 Gastro-esophageal reflux disease without esophagitis: Secondary | ICD-10-CM | POA: Diagnosis not present

## 2022-10-20 DIAGNOSIS — E7801 Familial hypercholesterolemia: Secondary | ICD-10-CM | POA: Diagnosis not present

## 2022-10-20 DIAGNOSIS — J431 Panlobular emphysema: Secondary | ICD-10-CM | POA: Diagnosis not present

## 2022-10-20 DIAGNOSIS — E1169 Type 2 diabetes mellitus with other specified complication: Secondary | ICD-10-CM | POA: Diagnosis not present

## 2022-10-20 DIAGNOSIS — C3491 Malignant neoplasm of unspecified part of right bronchus or lung: Secondary | ICD-10-CM | POA: Diagnosis not present

## 2022-11-02 DIAGNOSIS — S838X2A Sprain of other specified parts of left knee, initial encounter: Secondary | ICD-10-CM | POA: Diagnosis not present

## 2022-11-02 DIAGNOSIS — S8392XA Sprain of unspecified site of left knee, initial encounter: Secondary | ICD-10-CM | POA: Diagnosis not present

## 2022-11-02 DIAGNOSIS — E1122 Type 2 diabetes mellitus with diabetic chronic kidney disease: Secondary | ICD-10-CM | POA: Diagnosis not present

## 2022-11-02 DIAGNOSIS — X509XXA Other and unspecified overexertion or strenuous movements or postures, initial encounter: Secondary | ICD-10-CM | POA: Diagnosis not present

## 2022-11-02 DIAGNOSIS — M25562 Pain in left knee: Secondary | ICD-10-CM | POA: Diagnosis not present

## 2022-11-02 DIAGNOSIS — Z96652 Presence of left artificial knee joint: Secondary | ICD-10-CM | POA: Diagnosis not present

## 2022-11-02 DIAGNOSIS — Z7984 Long term (current) use of oral hypoglycemic drugs: Secondary | ICD-10-CM | POA: Diagnosis not present

## 2022-11-02 DIAGNOSIS — Z471 Aftercare following joint replacement surgery: Secondary | ICD-10-CM | POA: Diagnosis not present

## 2022-11-02 DIAGNOSIS — K219 Gastro-esophageal reflux disease without esophagitis: Secondary | ICD-10-CM | POA: Diagnosis not present

## 2022-11-02 DIAGNOSIS — X501XXA Overexertion from prolonged static or awkward postures, initial encounter: Secondary | ICD-10-CM | POA: Diagnosis not present

## 2022-11-02 DIAGNOSIS — N189 Chronic kidney disease, unspecified: Secondary | ICD-10-CM | POA: Diagnosis not present

## 2022-11-02 DIAGNOSIS — E7800 Pure hypercholesterolemia, unspecified: Secondary | ICD-10-CM | POA: Diagnosis not present

## 2022-11-02 DIAGNOSIS — F17210 Nicotine dependence, cigarettes, uncomplicated: Secondary | ICD-10-CM | POA: Diagnosis not present

## 2022-11-02 DIAGNOSIS — N1832 Chronic kidney disease, stage 3b: Secondary | ICD-10-CM | POA: Diagnosis not present

## 2022-11-07 DIAGNOSIS — M25472 Effusion, left ankle: Secondary | ICD-10-CM | POA: Diagnosis not present

## 2022-11-07 DIAGNOSIS — M1711 Unilateral primary osteoarthritis, right knee: Secondary | ICD-10-CM | POA: Diagnosis not present

## 2022-11-07 DIAGNOSIS — M25461 Effusion, right knee: Secondary | ICD-10-CM | POA: Diagnosis not present

## 2022-11-07 DIAGNOSIS — M899 Disorder of bone, unspecified: Secondary | ICD-10-CM | POA: Diagnosis not present

## 2022-11-07 DIAGNOSIS — S8992XS Unspecified injury of left lower leg, sequela: Secondary | ICD-10-CM | POA: Diagnosis not present

## 2022-11-07 DIAGNOSIS — S99912A Unspecified injury of left ankle, initial encounter: Secondary | ICD-10-CM | POA: Diagnosis not present

## 2022-11-07 DIAGNOSIS — M25572 Pain in left ankle and joints of left foot: Secondary | ICD-10-CM | POA: Diagnosis not present

## 2022-11-07 DIAGNOSIS — Z96652 Presence of left artificial knee joint: Secondary | ICD-10-CM | POA: Diagnosis not present

## 2022-11-09 DIAGNOSIS — N189 Chronic kidney disease, unspecified: Secondary | ICD-10-CM | POA: Diagnosis not present

## 2022-11-09 DIAGNOSIS — M79606 Pain in leg, unspecified: Secondary | ICD-10-CM | POA: Diagnosis not present

## 2022-11-09 DIAGNOSIS — R030 Elevated blood-pressure reading, without diagnosis of hypertension: Secondary | ICD-10-CM | POA: Diagnosis not present

## 2022-11-09 DIAGNOSIS — F17210 Nicotine dependence, cigarettes, uncomplicated: Secondary | ICD-10-CM | POA: Diagnosis not present

## 2022-11-09 DIAGNOSIS — R911 Solitary pulmonary nodule: Secondary | ICD-10-CM | POA: Diagnosis not present

## 2022-11-09 DIAGNOSIS — Z6831 Body mass index (BMI) 31.0-31.9, adult: Secondary | ICD-10-CM | POA: Diagnosis not present

## 2022-11-16 DIAGNOSIS — T8484XD Pain due to internal orthopedic prosthetic devices, implants and grafts, subsequent encounter: Secondary | ICD-10-CM | POA: Diagnosis not present

## 2022-11-16 DIAGNOSIS — Z96652 Presence of left artificial knee joint: Secondary | ICD-10-CM | POA: Diagnosis not present

## 2022-11-16 DIAGNOSIS — M25562 Pain in left knee: Secondary | ICD-10-CM | POA: Diagnosis not present

## 2022-11-16 DIAGNOSIS — S8992XS Unspecified injury of left lower leg, sequela: Secondary | ICD-10-CM | POA: Diagnosis not present

## 2022-11-16 DIAGNOSIS — X58XXXS Exposure to other specified factors, sequela: Secondary | ICD-10-CM | POA: Diagnosis not present

## 2022-11-16 DIAGNOSIS — Y831 Surgical operation with implant of artificial internal device as the cause of abnormal reaction of the patient, or of later complication, without mention of misadventure at the time of the procedure: Secondary | ICD-10-CM | POA: Diagnosis not present

## 2022-11-18 DIAGNOSIS — N1832 Chronic kidney disease, stage 3b: Secondary | ICD-10-CM | POA: Diagnosis not present

## 2022-11-18 DIAGNOSIS — K219 Gastro-esophageal reflux disease without esophagitis: Secondary | ICD-10-CM | POA: Diagnosis not present

## 2022-11-18 DIAGNOSIS — E1169 Type 2 diabetes mellitus with other specified complication: Secondary | ICD-10-CM | POA: Diagnosis not present

## 2022-11-21 DIAGNOSIS — J431 Panlobular emphysema: Secondary | ICD-10-CM | POA: Diagnosis not present

## 2022-11-21 DIAGNOSIS — J181 Lobar pneumonia, unspecified organism: Secondary | ICD-10-CM | POA: Diagnosis not present

## 2022-11-21 DIAGNOSIS — R911 Solitary pulmonary nodule: Secondary | ICD-10-CM | POA: Diagnosis not present

## 2022-11-22 DIAGNOSIS — M25461 Effusion, right knee: Secondary | ICD-10-CM | POA: Diagnosis not present

## 2022-11-22 DIAGNOSIS — S99912A Unspecified injury of left ankle, initial encounter: Secondary | ICD-10-CM | POA: Diagnosis not present

## 2022-11-22 DIAGNOSIS — S8992XS Unspecified injury of left lower leg, sequela: Secondary | ICD-10-CM | POA: Diagnosis not present

## 2022-11-22 DIAGNOSIS — Z96652 Presence of left artificial knee joint: Secondary | ICD-10-CM | POA: Diagnosis not present

## 2022-11-22 DIAGNOSIS — T8484XD Pain due to internal orthopedic prosthetic devices, implants and grafts, subsequent encounter: Secondary | ICD-10-CM | POA: Diagnosis not present

## 2022-11-22 DIAGNOSIS — M25572 Pain in left ankle and joints of left foot: Secondary | ICD-10-CM | POA: Diagnosis not present

## 2022-11-22 DIAGNOSIS — M1711 Unilateral primary osteoarthritis, right knee: Secondary | ICD-10-CM | POA: Diagnosis not present

## 2022-12-06 DIAGNOSIS — Z96652 Presence of left artificial knee joint: Secondary | ICD-10-CM | POA: Diagnosis not present

## 2022-12-06 DIAGNOSIS — M25461 Effusion, right knee: Secondary | ICD-10-CM | POA: Diagnosis not present

## 2022-12-06 DIAGNOSIS — S8992XS Unspecified injury of left lower leg, sequela: Secondary | ICD-10-CM | POA: Diagnosis not present

## 2022-12-06 DIAGNOSIS — M25572 Pain in left ankle and joints of left foot: Secondary | ICD-10-CM | POA: Diagnosis not present

## 2022-12-06 DIAGNOSIS — T8484XD Pain due to internal orthopedic prosthetic devices, implants and grafts, subsequent encounter: Secondary | ICD-10-CM | POA: Diagnosis not present

## 2022-12-06 DIAGNOSIS — M1711 Unilateral primary osteoarthritis, right knee: Secondary | ICD-10-CM | POA: Diagnosis not present

## 2022-12-14 DIAGNOSIS — F17200 Nicotine dependence, unspecified, uncomplicated: Secondary | ICD-10-CM | POA: Diagnosis not present

## 2022-12-14 DIAGNOSIS — K219 Gastro-esophageal reflux disease without esophagitis: Secondary | ICD-10-CM | POA: Diagnosis not present

## 2022-12-14 DIAGNOSIS — I712 Thoracic aortic aneurysm, without rupture, unspecified: Secondary | ICD-10-CM | POA: Diagnosis not present

## 2022-12-14 DIAGNOSIS — K5900 Constipation, unspecified: Secondary | ICD-10-CM | POA: Diagnosis not present

## 2022-12-14 DIAGNOSIS — I38 Endocarditis, valve unspecified: Secondary | ICD-10-CM | POA: Diagnosis not present

## 2022-12-14 DIAGNOSIS — N1832 Chronic kidney disease, stage 3b: Secondary | ICD-10-CM | POA: Diagnosis not present

## 2022-12-14 DIAGNOSIS — R918 Other nonspecific abnormal finding of lung field: Secondary | ICD-10-CM | POA: Diagnosis not present

## 2022-12-14 DIAGNOSIS — E7801 Familial hypercholesterolemia: Secondary | ICD-10-CM | POA: Diagnosis not present

## 2022-12-14 DIAGNOSIS — M25562 Pain in left knee: Secondary | ICD-10-CM | POA: Diagnosis not present

## 2022-12-14 DIAGNOSIS — E1169 Type 2 diabetes mellitus with other specified complication: Secondary | ICD-10-CM | POA: Diagnosis not present

## 2022-12-27 DIAGNOSIS — R911 Solitary pulmonary nodule: Secondary | ICD-10-CM | POA: Diagnosis not present

## 2022-12-27 DIAGNOSIS — R918 Other nonspecific abnormal finding of lung field: Secondary | ICD-10-CM | POA: Diagnosis not present

## 2022-12-27 DIAGNOSIS — J8410 Pulmonary fibrosis, unspecified: Secondary | ICD-10-CM | POA: Diagnosis not present

## 2022-12-27 DIAGNOSIS — J439 Emphysema, unspecified: Secondary | ICD-10-CM | POA: Diagnosis not present

## 2022-12-27 DIAGNOSIS — J984 Other disorders of lung: Secondary | ICD-10-CM | POA: Diagnosis not present

## 2022-12-27 DIAGNOSIS — J431 Panlobular emphysema: Secondary | ICD-10-CM | POA: Diagnosis not present

## 2023-01-09 DIAGNOSIS — N184 Chronic kidney disease, stage 4 (severe): Secondary | ICD-10-CM | POA: Diagnosis not present

## 2023-01-09 DIAGNOSIS — I10 Essential (primary) hypertension: Secondary | ICD-10-CM | POA: Diagnosis not present

## 2023-01-09 DIAGNOSIS — D649 Anemia, unspecified: Secondary | ICD-10-CM | POA: Diagnosis not present

## 2023-01-09 DIAGNOSIS — I340 Nonrheumatic mitral (valve) insufficiency: Secondary | ICD-10-CM | POA: Diagnosis not present

## 2023-01-18 DIAGNOSIS — N1832 Chronic kidney disease, stage 3b: Secondary | ICD-10-CM | POA: Diagnosis not present

## 2023-01-18 DIAGNOSIS — K219 Gastro-esophageal reflux disease without esophagitis: Secondary | ICD-10-CM | POA: Diagnosis not present

## 2023-01-18 DIAGNOSIS — E1169 Type 2 diabetes mellitus with other specified complication: Secondary | ICD-10-CM | POA: Diagnosis not present

## 2023-01-27 ENCOUNTER — Ambulatory Visit: Payer: Medicare HMO | Admitting: Cardiology

## 2023-01-27 NOTE — Progress Notes (Deleted)
Clinical Summary Tristan Brooks is a 82 y.o.male  seen today for follow up of the following medical problems.    1. Aortic stenosis - echo 09/2016 mean grad 30, AVA VTI 0.83, VTI dimensionless index 0.24. Overall moderate to severe AS.   07/2017 echo: LVEF 55-60%, mod AS, mod AI. AS mean grad 22, AVA VTI 1.3, dimensionless index 0.32   08/2018 echo LVEF 55-60%, grade I diastolic dysfunction, moderate AS AVA VTI 1.2 AV mean 20       09/2019 echo LVEF 60-65%, grade I DDx. Moderate AS (mean grad 15, AVA VTI 0.9, DI 0.51. LVOT Diam 1.5) - the difference from 08/2018 appears to be a smaller LVOT diameter used.  -12/2020 echo LVEF 50-55%, hypokiensis inferior/inferolateral walls, grade II dd, AV mean grade 22, DI 0.37, AVA VTI 0.94     01/2022 echo: LVEF 55-60%, mod AS AVA VTI 1.11, mean grad 24, DI 0.44 - no chest pain, no SOB          2. Hyperlipidemia - labs followed by pcp      3. CKD - last Cr was 2         4. Lung mass - noted on 11/2021 CT at [facility]  Dense, masslike subpleural consolidation of the peripheral right  upper lobe, measuring at least 10.7 x 2.6 cm. This is highly  concerning for primary lung malignancy in the high risk setting of  emphysema, although infection is a differential consideration - ongoing workup at [facility]   - bronch 05/2022 - he reports told not cancer, likely infectious process   5. Aortic aneurysm - 3. Enlargement of the tubular ascending thoracic aorta, measuring up  to 4.3 x 4.2 cm CT chest [facility] 05/2022 mild aneurysm 4.2 cm ascending aorta   07/2022 CT PE:  Wife passed recently 02/2020 Past Medical History:  Diagnosis Date   Diabetes mellitus without complication (H[facility])    Neuropathy      No Known Allergies   Current Outpatient Medications  Medication Sig Dispense Refill   aspirin EC 81 MG tablet Take 1 tablet (81 mg total) by mouth daily. 90 tablet 3   atorvastatin (LIPITOR) 20 MG tablet Take 20 mg by mouth daily.      Cholecalciferol (VITAMIN D-3) 25 MCG (1000 UT) CAPS Take 1 capsule by mouth daily.     Ferrous Sulfate Dried (HIGH POTENCY IRON) 65 MG TABS Take 1 tablet by mouth daily.     glipiZIDE (GLUCOTROL) 5 MG tablet Take 5 mg by mouth every morning.     losartan (COZAAR) 25 MG tablet Take 12.5 mg by mouth daily. (Patient not taking: Reported on 03/21/2022)     mirtazapine (REMERON) 15 MG tablet Take 15 mg by mouth at bedtime.     pantoprazole (PROTONIX) 40 MG tablet Take 40 mg by mouth daily. (Patient not taking: Reported on 07/22/2022)     pioglitazone (ACTOS) 30 MG tablet Take 30 mg by mouth daily.     No current facility-administered medications for this visit.     Past Surgical History:  Procedure Laterality Date   ROTATOR CUFF REPAIR Right    TOTAL KNEE ARTHROPLASTY       No Known Allergies    Family History  Problem Relation Age of Onset   Heart Problems Father    Heart attack Sister      Social History Tristan Brooks reports that he has been smoking cigarettes. He has never used smokeless tobacco. [name] reports no history of alcohol use.   Review of Systems CONSTITUTIONAL: No weight loss, fever, chills, weakness or fatigue.  HEENT: Eyes: No visual loss, blurred vision, double vision or yellow sclerae.No hearing loss, sneezing, congestion, runny nose or sore throat.  SKIN: No rash or itching.  CARDIOVASCULAR:  RESPIRATORY: No shortness of breath, cough or sputum.  GASTROINTESTINAL: No anorexia, nausea, vomiting or diarrhea. No abdominal pain or blood.  GENITOURINARY: No burning on urination, no polyuria NEUROLOGICAL: No headache, dizziness, syncope, paralysis, ataxia, numbness or tingling in the extremities. No change in bowel or bladder control.  MUSCULOSKELETAL: No muscle, back pain, joint pain or stiffness.  LYMPHATICS: No enlarged nodes. No history of splenectomy.  PSYCHIATRIC: No history of depression or anxiety.  ENDOCRINOLOGIC: No reports of sweating, cold or  heat intolerance. No polyuria or polydipsia.  Marland Kitchen   Physical Examination There were no vitals filed for this visit. There were no vitals filed for this visit.  Gen: resting comfortably, no acute distress HEENT: no scleral icterus, pupils equal round and reactive, no palptable cervical adenopathy,  CV Resp: Clear to auscultation bilaterally GI: abdomen is soft, non-tender, non-distended, normal bowel sounds, no hepatosplenomegaly MSK: extremities are warm, no edema.  Skin: warm, no rash Neuro:  no focal deficits Psych: appropriate affect   Diagnostic Studies  09/2016 echo Study Conclusions   - Left ventricle: The cavity size was normal. Wall thickness was   normal. Systolic function was at the lower limits of normal. The   estimated ejection fraction was in the range of 50% to 55%.   Doppler parameters are consistent with abnormal left ventricular   relaxation (grade 1 diastolic dysfunction). Doppler parameters   are consistent with high ventricular filling pressure. - Regional wall motion abnormality: Mild hypokinesis of the   basal-mid inferior and mid inferolateral myocardium. - Aortic valve: Moderately to severely calcified annulus.   Moderately thickened, moderately calcified leaflets. There was   moderate to severe stenosis. There was mild to moderate   regurgitation. Peak velocity (S): 377 cm/s. Mean gradient (S): 30   mm Hg. Valve area (VTI): 0.83 cm^2. Valve area (Vmax): 0.78 cm^2.   Valve area (Vmean): 0.8 cm^2. - Aorta: Mild aortic root dilatation. Aortic root dimension: 40 mm   (ED). - Mitral valve: Moderately to severely calcified annulus. - Left atrium: The atrium was severely dilated.     07/2017 echo   Study Conclusions   - Left ventricle: The cavity size was normal. Wall thickness was   normal. Systolic function was normal. The estimated ejection   fraction was in the range of 55% to 60%. Wall motion was normal;   there were no regional wall motion  abnormalities. Doppler   parameters are consistent with abnormal left ventricular   relaxation (grade 1 diastolic dysfunction). Doppler parameters   are consistent with high ventricular filling pressure. - Aortic valve: Moderately to severely calcified annulus.   Trileaflet; moderately calcified leaflets. There was moderate   stenosis. There was moderate regurgitation. Peak velocity (S):   323 cm/s. Mean gradient (S): 22 mm Hg. Valve area (VTI): 1.3   cm^2. Valve area (Vmax): 1.1 cm^2. Valve area (Vmean): 1.09 cm^2. - Mitral valve: Calcified annulus.   08/2018 echo IMPRESSIONS      1. The left ventricle has normal systolic function, with an ejection fraction of 55-60%. The cavity size was normal. There is mildly increased left ventricular wall thickness. Left ventricular diastolic Doppler parameters are consistent with impaired  relaxation. Possible  basal inferolateral hypokinesis .  2. The right ventricle has normal systolic function. The cavity was normal. There is no increase in right ventricular wall thickness. Right ventricular systolic pressure normal with an estimated pressure of 9.2 mmHg.  3. The aortic valve is functionally bicuspid. Moderate calcification of the aortic valve. Aortic valve regurgitation is mild by color flow Doppler. Moderate stenosis of the aortic valve. LVOT/AV VTI 0.37 and AVA approximately 1.2 cm2. Mild aortic  annular calcification noted.  4. The mitral valve is degenerative. Mild thickening of the mitral valve leaflet. There is moderate mitral annular calcification present.  5. The tricuspid valve is grossly normal.   09/2019 echo 1. Left ventricular ejection fraction, by estimation, is 60 to 65%. The  left ventricle has normal function. The left ventricle has no regional  wall motion abnormalities. There is severe concentric left ventricular  hypertrophy. Left ventricular diastolic   parameters are consistent with Grade I diastolic dysfunction (impaired   relaxation). Elevated left ventricular end-diastolic pressure.   2. Right ventricular systolic function is low normal. The right  ventricular size is normal.   3. The mitral valve is degenerative. No evidence of mitral valve  regurgitation. Mild mitral stenosis.   4. Aortic valve leaflets were poorly visualized. Overall, aortic stenosis  appeared moderate in severity.. The aortic valve has an indeterminant  number of cusps. Aortic valve regurgitation is mild. Moderate aortic valve  stenosis.      12/2020 echo IMPRESSIONS     1. Hypokinesis of the inferolateral wall (base, mid), inferior wall  (base).. Left ventricular ejection fraction, by estimation, is 50 to 55%.  The left ventricle has low normal function. The left ventricle has no  regional wall motion abnormalities. Left  ventricular diastolic parameters are consistent with Grade II diastolic  dysfunction (pseudonormalization). Elevated left atrial pressure.   2. Right ventricular systolic function is normal. The right ventricular  size is normal.   3. The mitral valve is abnormal. Trivial mitral valve regurgitation.   4. AV is not well visualized. Peak and mean gradients through the valve  are 38 and 22 mm Hg respectively Dimensionless index is 0.37. Overall  consistent with moderate AS. Compared to echo from 2021 Mean gradient is  increased (14 to 22 mm now) and  dimensionless index is decreased. . The aortic valve is abnormal. Aortic  valve regurgitation is mild to moderate.   5. The inferior vena cava is normal in size with greater than 50%  respiratory variability, suggesting right atrial pressure of 3 mmHg   01/2022 echo 1. Left ventricular ejection fraction, by estimation, is 55%. The left  ventricle has normal function. The left ventricle demonstrates regional  wall motion abnormalities (see scoring diagram/findings for description).  There is mild asymmetric left  ventricular hypertrophy of the septal segment. Left  ventricular diastolic  parameters are consistent with Grade I diastolic dysfunction (impaired  relaxation). The average left ventricular global longitudinal strain is  -13.2 %. The global longitudinal  strain is abnormal.   2. RA-RV gradient 8 mmHg suggesting normal RVSP. Right ventricular  systolic function is normal. The right ventricular size is normal.   3. The mitral valve is degenerative. Trivial mitral valve regurgitation.  Moderate mitral annular calcification.   4. The aortic valve is bicuspid. There is moderate calcification of the  aortic valve. Aortic valve regurgitation is moderate. Moderate to severe  aortic valve stenosis. Aortic valve area, by VTI measures 1.11 cm. Aortic  valve mean gradient measures 24.0  mmHg. Dimentionless index 0.44.   5. Aortic dilatation noted. There is mild dilatation of the aortic root,  measuring 42 mm. There is mild dilatation of the ascending aorta,  measuring 39 mm.   6. Unable to estimate CVP.      Assessment and Plan    1. Aortic stenosis - moderate AS by last echo - no symptoms, continue to monitor. Repeat echo after next visit summer 2024.      2. Hyperlipidemia -continue current meds, request pcp labs.    3. Aortic aneurysm - mild and stable, continue to monitor - getting regular CTs for his lung mass, continue to follow these results for the aneurysm.    4.HTN - at goal, continue current meds   EKG today SR, chronic ST/T changes    Antoine Poche, M.D., F.A.C.C.

## 2023-01-28 DIAGNOSIS — U071 COVID-19: Secondary | ICD-10-CM | POA: Diagnosis not present

## 2023-01-28 DIAGNOSIS — K219 Gastro-esophageal reflux disease without esophagitis: Secondary | ICD-10-CM | POA: Diagnosis not present

## 2023-01-28 DIAGNOSIS — E1122 Type 2 diabetes mellitus with diabetic chronic kidney disease: Secondary | ICD-10-CM | POA: Diagnosis not present

## 2023-01-28 DIAGNOSIS — R918 Other nonspecific abnormal finding of lung field: Secondary | ICD-10-CM | POA: Diagnosis not present

## 2023-01-28 DIAGNOSIS — R0989 Other specified symptoms and signs involving the circulatory and respiratory systems: Secondary | ICD-10-CM | POA: Diagnosis not present

## 2023-01-28 DIAGNOSIS — R059 Cough, unspecified: Secondary | ICD-10-CM | POA: Diagnosis not present

## 2023-01-28 DIAGNOSIS — E7800 Pure hypercholesterolemia, unspecified: Secondary | ICD-10-CM | POA: Diagnosis not present

## 2023-01-28 DIAGNOSIS — Z7984 Long term (current) use of oral hypoglycemic drugs: Secondary | ICD-10-CM | POA: Diagnosis not present

## 2023-01-28 DIAGNOSIS — Z79899 Other long term (current) drug therapy: Secondary | ICD-10-CM | POA: Diagnosis not present

## 2023-02-06 DIAGNOSIS — R030 Elevated blood-pressure reading, without diagnosis of hypertension: Secondary | ICD-10-CM | POA: Diagnosis not present

## 2023-02-06 DIAGNOSIS — Z6831 Body mass index (BMI) 31.0-31.9, adult: Secondary | ICD-10-CM | POA: Diagnosis not present

## 2023-02-06 DIAGNOSIS — U071 COVID-19: Secondary | ICD-10-CM | POA: Diagnosis not present

## 2023-02-06 DIAGNOSIS — R059 Cough, unspecified: Secondary | ICD-10-CM | POA: Diagnosis not present

## 2023-02-06 DIAGNOSIS — F17210 Nicotine dependence, cigarettes, uncomplicated: Secondary | ICD-10-CM | POA: Diagnosis not present

## 2023-02-07 DIAGNOSIS — E1169 Type 2 diabetes mellitus with other specified complication: Secondary | ICD-10-CM | POA: Diagnosis not present

## 2023-02-07 DIAGNOSIS — N189 Chronic kidney disease, unspecified: Secondary | ICD-10-CM | POA: Diagnosis not present

## 2023-02-07 DIAGNOSIS — K219 Gastro-esophageal reflux disease without esophagitis: Secondary | ICD-10-CM | POA: Diagnosis not present

## 2023-02-07 DIAGNOSIS — E7801 Familial hypercholesterolemia: Secondary | ICD-10-CM | POA: Diagnosis not present

## 2023-02-14 DIAGNOSIS — I712 Thoracic aortic aneurysm, without rupture, unspecified: Secondary | ICD-10-CM | POA: Diagnosis not present

## 2023-02-14 DIAGNOSIS — C3490 Malignant neoplasm of unspecified part of unspecified bronchus or lung: Secondary | ICD-10-CM | POA: Diagnosis not present

## 2023-02-14 DIAGNOSIS — E7801 Familial hypercholesterolemia: Secondary | ICD-10-CM | POA: Diagnosis not present

## 2023-02-14 DIAGNOSIS — N189 Chronic kidney disease, unspecified: Secondary | ICD-10-CM | POA: Diagnosis not present

## 2023-02-14 DIAGNOSIS — Z1389 Encounter for screening for other disorder: Secondary | ICD-10-CM | POA: Diagnosis not present

## 2023-02-14 DIAGNOSIS — R030 Elevated blood-pressure reading, without diagnosis of hypertension: Secondary | ICD-10-CM | POA: Diagnosis not present

## 2023-02-14 DIAGNOSIS — M25562 Pain in left knee: Secondary | ICD-10-CM | POA: Diagnosis not present

## 2023-02-14 DIAGNOSIS — Z1331 Encounter for screening for depression: Secondary | ICD-10-CM | POA: Diagnosis not present

## 2023-02-14 DIAGNOSIS — F17200 Nicotine dependence, unspecified, uncomplicated: Secondary | ICD-10-CM | POA: Diagnosis not present

## 2023-02-14 DIAGNOSIS — I38 Endocarditis, valve unspecified: Secondary | ICD-10-CM | POA: Diagnosis not present

## 2023-02-14 DIAGNOSIS — K219 Gastro-esophageal reflux disease without esophagitis: Secondary | ICD-10-CM | POA: Diagnosis not present

## 2023-02-14 DIAGNOSIS — E1169 Type 2 diabetes mellitus with other specified complication: Secondary | ICD-10-CM | POA: Diagnosis not present

## 2023-02-22 DIAGNOSIS — R942 Abnormal results of pulmonary function studies: Secondary | ICD-10-CM | POA: Diagnosis not present

## 2023-02-22 DIAGNOSIS — J181 Lobar pneumonia, unspecified organism: Secondary | ICD-10-CM | POA: Diagnosis not present

## 2023-02-22 DIAGNOSIS — R911 Solitary pulmonary nodule: Secondary | ICD-10-CM | POA: Diagnosis not present

## 2023-02-27 ENCOUNTER — Other Ambulatory Visit: Payer: Self-pay | Admitting: Surgery

## 2023-02-27 DIAGNOSIS — M25562 Pain in left knee: Secondary | ICD-10-CM | POA: Diagnosis not present

## 2023-02-27 DIAGNOSIS — M25572 Pain in left ankle and joints of left foot: Secondary | ICD-10-CM | POA: Diagnosis not present

## 2023-02-27 DIAGNOSIS — Z96652 Presence of left artificial knee joint: Secondary | ICD-10-CM | POA: Diagnosis not present

## 2023-02-27 DIAGNOSIS — M1711 Unilateral primary osteoarthritis, right knee: Secondary | ICD-10-CM | POA: Diagnosis not present

## 2023-02-27 DIAGNOSIS — M5137 Other intervertebral disc degeneration, lumbosacral region: Secondary | ICD-10-CM | POA: Diagnosis not present

## 2023-02-27 DIAGNOSIS — I7121 Aneurysm of the ascending aorta, without rupture: Secondary | ICD-10-CM

## 2023-02-27 DIAGNOSIS — T8484XD Pain due to internal orthopedic prosthetic devices, implants and grafts, subsequent encounter: Secondary | ICD-10-CM | POA: Diagnosis not present

## 2023-03-08 DIAGNOSIS — J431 Panlobular emphysema: Secondary | ICD-10-CM | POA: Diagnosis not present

## 2023-03-08 DIAGNOSIS — F17211 Nicotine dependence, cigarettes, in remission: Secondary | ICD-10-CM | POA: Diagnosis not present

## 2023-03-08 DIAGNOSIS — J181 Lobar pneumonia, unspecified organism: Secondary | ICD-10-CM | POA: Diagnosis not present

## 2023-03-22 ENCOUNTER — Encounter: Payer: Self-pay | Admitting: Nurse Practitioner

## 2023-03-22 ENCOUNTER — Ambulatory Visit: Payer: Medicare HMO | Attending: Cardiology | Admitting: Nurse Practitioner

## 2023-03-22 VITALS — BP 140/80 | HR 67 | Ht 73.0 in | Wt 230.6 lb

## 2023-03-22 DIAGNOSIS — E785 Hyperlipidemia, unspecified: Secondary | ICD-10-CM

## 2023-03-22 DIAGNOSIS — I7121 Aneurysm of the ascending aorta, without rupture: Secondary | ICD-10-CM | POA: Diagnosis not present

## 2023-03-22 DIAGNOSIS — I10 Essential (primary) hypertension: Secondary | ICD-10-CM | POA: Diagnosis not present

## 2023-03-22 DIAGNOSIS — R918 Other nonspecific abnormal finding of lung field: Secondary | ICD-10-CM

## 2023-03-22 DIAGNOSIS — I350 Nonrheumatic aortic (valve) stenosis: Secondary | ICD-10-CM

## 2023-03-22 NOTE — Patient Instructions (Addendum)
Medication Instructions:  Your physician recommends that you continue on your current medications as directed. Please refer to the Current Medication list given to you today.   Labwork: None  Testing/Procedures: Your physician has requested that you have an echocardiogram. Echocardiography is a painless test that uses sound waves to create images of your heart. It provides your doctor with information about the size and shape of your heart and how well your heart's chambers and valves are working. This procedure takes approximately one hour. There are no restrictions for this procedure. Please do NOT wear cologne, perfume, aftershave, or lotions (deodorant is allowed). Please arrive 15 minutes prior to your appointment time.   Follow-Up: Your physician recommends that you schedule a follow-up appointment in: 6 months with Dr. Wyline Mood or Lanora Manis  Any Other Special Instructions Will Be Listed Below (If Applicable).  Thank you for choosing [facility]!      If you need a refill on your cardiac medications before your next appointment, please call your pharmacy.

## 2023-03-22 NOTE — Progress Notes (Addendum)
Cardiology Office Note:  .   Date:  03/22/2023 ID:  Tristan Brooks, DOB [birth year], MRN [id number] PCP: [facility]   [facility] Providers Cardiologist:  Dina Rich, MD    History of Present Illness: .   Tristan Brooks is a 82 y.o. male with a PMH of aortic valve stenosis, hyperlipidemia, lung mass, COPD, ascending aortic aneurysm, CKD, who presents today for 31-month follow-up appointment.  TTE in August 2023 showed EF 55 to 60%, moderate aortic valve stenosis with mean gradient 24 mmHg.  CT scan in 2023 at [facility] showed dense, masslike subpleural consolidation of peripheral right upper lobe, measuring 10.7 x 2.6 cm.  Was highly concerning for primary lung malignancy and high risk setting of emphysema.  Followed by [facility] in [place].  Underwent bronchoscopy in December 2023, was told it was not cancer and likely infectious process.  CT scan of chest in December 2023 showed mild aneurysm at 4.2 cm of ascending aorta.  Last seen by Dr. Dina Rich on July 22, 2022.  He was doing well at the time.   Today he presents for 95-month follow-up visit.  He states he is doing well. Denies any chest pain, shortness of breath, palpitations, syncope, presyncope, dizziness, orthopnea, PND, swelling or significant weight changes, acute bleeding, or claudication. Admits to arthritis, but denies any pain. Scheduled for CT scan of his chest later this month.   ROS: Negative. See HPI.   Studies Reviewed: .    Echo 01/2022:  1. Left ventricular ejection fraction, by estimation, is 55%. The left  ventricle has normal function. The left ventricle demonstrates regional wall motion abnormalities (see scoring diagram/findings for description). There is mild asymmetric left ventricular hypertrophy of the septal segment. Left ventricular diastolic parameters are consistent with Grade I diastolic dysfunction (impaired relaxation). The average left ventricular global longitudinal  strain is -13.2 %. The global longitudinal strain is abnormal.   2. RA-RV gradient 8 mmHg suggesting normal RVSP. Right ventricular systolic function is normal. The right ventricular size is normal.   3. The mitral valve is degenerative. Trivial mitral valve regurgitation.  Moderate mitral annular calcification.   4. The aortic valve is bicuspid. There is moderate calcification of the  aortic valve. Aortic valve regurgitation is moderate. Moderate to severe aortic valve stenosis. Aortic valve area, by VTI measures 1.11 cm. Aortic valve mean gradient measures 24.0 mmHg. Dimentionless index 0.44.   5. Aortic dilatation noted. There is mild dilatation of the aortic root,  measuring 42 mm. There is mild dilatation of the ascending aorta,  measuring 39 mm.   6. Unable to estimate CVP.  Physical Exam:   VS:  BP (!) 140/80 (BP Location: Left Arm)   Pulse 67   Ht 6\' 1"  (1.854 m)   Wt 230 lb 9.6 oz (104.6 kg)   SpO2 97%   BMI 30.42 kg/m    Wt Readings from Last 3 Encounters:  03/22/23 230 lb 9.6 oz (104.6 kg)  08/07/22 218 lb 12.8 oz (99.2 kg)  07/22/22 218 lb 12.8 oz (99.2 kg)    GEN: Well nourished, well developed in no acute distress NECK: No JVD; No carotid bruits CARDIAC: S1/S2, RRR, Grade 2/6 murmur, no rubs, no gallops RESPIRATORY:  Clear to auscultation without rales, wheezing or rhonchi  EXTREMITIES:  No edema; deformity along bilateral hands from past injury at work, signs of osteoarthritis to bilateral hands/knuckles  ASSESSMENT AND PLAN: .    Aortic valve stenosis due to bicuspid aortic  valve TTE 01/2022 revealed bicuspid aortic valve with moderate to severe aortic valve stenosis, mean gradient measuring 24.0 mmHg. Patient remains asymptomatic. Will update Echocardiogram at this time for further evaluation. No medication changes at this time. Encouraged him to stay well hydrated. Care and ED precautions discussed. Heart healthy diet encouraged.   HLD No recent labs on file.  Will request most recent labs from PCP's office. Continue atorvastatin and Zetia. Heart healthy diet encouraged.   3. HTN BP borderline elevated, denies BP issues at home. Discussed to monitor BP at home at least 2 hours after medications and sitting for 5-10 minutes. Will continue to monitor. No medication changes at this time. Heart healthy diet encouraged.   4. Lung Mass CT scan in 2023 at [facility] showed dense, masslike subpleural consolidation of peripheral right upper lobe, measuring 10.7 x 2.6 cm.  Was highly concerning for primary lung malignancy and high risk setting of emphysema.  Followed by [facility] in [place]. -- Underwent bronchoscopy on 06/15/2022 which appears to have shown candida, no malignancy noted.  - CT scan of chest 12/27/2022 revealed Slight improvement in large area of peripheral right middle lobe masslike consolidation. Background emphysema with stable additional small pulmonary nodules.  - Follows Heme/Onc and undergoing serial CT scans. 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 However, the mass appears relatively stable on serial imaging. Given the seemingly indolent behavior of his disease process and relative lack of symptoms, and after discussion with Dr. Ross Marcus he has elected to proceed with surveillance only for the time being." Next CT scan expected later this month. Continue to follow-up with Pulmonology and Heme/Onc.  5. Ascending aortic aneurysm CT scan 09/2022 revealed unchanged dilatation of ascending aorta around 4.2 cm. Will continue to monitor through serial CT scans that are being arranged through [facility], following Pulmonology and Heme/Onc - see above.   Dispo: Care and ED precautions discussed. Follow-up in 6 months with Dr. Dina Rich or APP.   Signed, Sharlene Dory, NP

## 2023-03-23 ENCOUNTER — Encounter: Payer: Self-pay | Admitting: Internal Medicine

## 2023-03-27 DIAGNOSIS — Z743 Need for continuous supervision: Secondary | ICD-10-CM | POA: Diagnosis not present

## 2023-03-27 DIAGNOSIS — R0781 Pleurodynia: Secondary | ICD-10-CM | POA: Diagnosis not present

## 2023-03-27 DIAGNOSIS — E7800 Pure hypercholesterolemia, unspecified: Secondary | ICD-10-CM | POA: Diagnosis not present

## 2023-03-27 DIAGNOSIS — E1122 Type 2 diabetes mellitus with diabetic chronic kidney disease: Secondary | ICD-10-CM | POA: Diagnosis not present

## 2023-03-27 DIAGNOSIS — I7121 Aneurysm of the ascending aorta, without rupture: Secondary | ICD-10-CM | POA: Diagnosis not present

## 2023-03-27 DIAGNOSIS — N189 Chronic kidney disease, unspecified: Secondary | ICD-10-CM | POA: Diagnosis not present

## 2023-03-27 DIAGNOSIS — S2242XA Multiple fractures of ribs, left side, initial encounter for closed fracture: Secondary | ICD-10-CM | POA: Diagnosis not present

## 2023-03-27 DIAGNOSIS — W1830XA Fall on same level, unspecified, initial encounter: Secondary | ICD-10-CM | POA: Diagnosis not present

## 2023-03-27 DIAGNOSIS — E860 Dehydration: Secondary | ICD-10-CM | POA: Diagnosis not present

## 2023-03-27 DIAGNOSIS — R109 Unspecified abdominal pain: Secondary | ICD-10-CM | POA: Diagnosis not present

## 2023-03-27 DIAGNOSIS — I10 Essential (primary) hypertension: Secondary | ICD-10-CM | POA: Diagnosis not present

## 2023-03-27 DIAGNOSIS — F17210 Nicotine dependence, cigarettes, uncomplicated: Secondary | ICD-10-CM | POA: Diagnosis not present

## 2023-03-27 DIAGNOSIS — Y92481 Parking lot as the place of occurrence of the external cause: Secondary | ICD-10-CM | POA: Diagnosis not present

## 2023-03-27 DIAGNOSIS — S2232XA Fracture of one rib, left side, initial encounter for closed fracture: Secondary | ICD-10-CM | POA: Diagnosis not present

## 2023-03-27 DIAGNOSIS — N200 Calculus of kidney: Secondary | ICD-10-CM | POA: Diagnosis not present

## 2023-03-27 DIAGNOSIS — J439 Emphysema, unspecified: Secondary | ICD-10-CM | POA: Diagnosis not present

## 2023-03-27 DIAGNOSIS — I6523 Occlusion and stenosis of bilateral carotid arteries: Secondary | ICD-10-CM | POA: Diagnosis not present

## 2023-03-27 DIAGNOSIS — I7123 Aneurysm of the descending thoracic aorta, without rupture: Secondary | ICD-10-CM | POA: Diagnosis not present

## 2023-03-27 DIAGNOSIS — M47812 Spondylosis without myelopathy or radiculopathy, cervical region: Secondary | ICD-10-CM | POA: Diagnosis not present

## 2023-03-27 DIAGNOSIS — S0990XA Unspecified injury of head, initial encounter: Secondary | ICD-10-CM | POA: Diagnosis not present

## 2023-03-27 DIAGNOSIS — K661 Hemoperitoneum: Secondary | ICD-10-CM | POA: Diagnosis not present

## 2023-03-27 DIAGNOSIS — R9089 Other abnormal findings on diagnostic imaging of central nervous system: Secondary | ICD-10-CM | POA: Diagnosis not present

## 2023-03-27 DIAGNOSIS — W19XXXA Unspecified fall, initial encounter: Secondary | ICD-10-CM | POA: Diagnosis not present

## 2023-03-28 ENCOUNTER — Encounter: Payer: Self-pay | Admitting: Surgery

## 2023-03-28 DIAGNOSIS — R0902 Hypoxemia: Secondary | ICD-10-CM | POA: Diagnosis not present

## 2023-03-28 DIAGNOSIS — Z85118 Personal history of other malignant neoplasm of bronchus and lung: Secondary | ICD-10-CM | POA: Diagnosis not present

## 2023-03-28 DIAGNOSIS — S2242XD Multiple fractures of ribs, left side, subsequent encounter for fracture with routine healing: Secondary | ICD-10-CM | POA: Diagnosis not present

## 2023-03-28 DIAGNOSIS — I499 Cardiac arrhythmia, unspecified: Secondary | ICD-10-CM | POA: Diagnosis not present

## 2023-03-28 DIAGNOSIS — E1151 Type 2 diabetes mellitus with diabetic peripheral angiopathy without gangrene: Secondary | ICD-10-CM | POA: Diagnosis not present

## 2023-03-28 DIAGNOSIS — M546 Pain in thoracic spine: Secondary | ICD-10-CM | POA: Diagnosis not present

## 2023-03-28 DIAGNOSIS — M19011 Primary osteoarthritis, right shoulder: Secondary | ICD-10-CM | POA: Diagnosis not present

## 2023-03-28 DIAGNOSIS — S36039A Unspecified laceration of spleen, initial encounter: Secondary | ICD-10-CM | POA: Diagnosis not present

## 2023-03-28 DIAGNOSIS — E1122 Type 2 diabetes mellitus with diabetic chronic kidney disease: Secondary | ICD-10-CM | POA: Diagnosis not present

## 2023-03-28 DIAGNOSIS — S2242XA Multiple fractures of ribs, left side, initial encounter for closed fracture: Secondary | ICD-10-CM | POA: Diagnosis not present

## 2023-03-28 DIAGNOSIS — W1830XA Fall on same level, unspecified, initial encounter: Secondary | ICD-10-CM | POA: Diagnosis not present

## 2023-03-28 DIAGNOSIS — S36032A Major laceration of spleen, initial encounter: Secondary | ICD-10-CM | POA: Diagnosis not present

## 2023-03-28 DIAGNOSIS — E860 Dehydration: Secondary | ICD-10-CM | POA: Diagnosis not present

## 2023-03-28 DIAGNOSIS — Z7982 Long term (current) use of aspirin: Secondary | ICD-10-CM | POA: Diagnosis not present

## 2023-03-28 DIAGNOSIS — M545 Low back pain, unspecified: Secondary | ICD-10-CM | POA: Diagnosis not present

## 2023-03-28 DIAGNOSIS — S0990XA Unspecified injury of head, initial encounter: Secondary | ICD-10-CM | POA: Diagnosis not present

## 2023-03-28 DIAGNOSIS — K5900 Constipation, unspecified: Secondary | ICD-10-CM | POA: Diagnosis not present

## 2023-03-28 DIAGNOSIS — K219 Gastro-esophageal reflux disease without esophagitis: Secondary | ICD-10-CM | POA: Diagnosis not present

## 2023-03-28 DIAGNOSIS — J432 Centrilobular emphysema: Secondary | ICD-10-CM | POA: Diagnosis not present

## 2023-03-28 DIAGNOSIS — F17210 Nicotine dependence, cigarettes, uncomplicated: Secondary | ICD-10-CM | POA: Diagnosis not present

## 2023-03-28 DIAGNOSIS — E7800 Pure hypercholesterolemia, unspecified: Secondary | ICD-10-CM | POA: Diagnosis not present

## 2023-03-28 DIAGNOSIS — Z743 Need for continuous supervision: Secondary | ICD-10-CM | POA: Diagnosis not present

## 2023-03-28 DIAGNOSIS — Z5986 Financial insecurity: Secondary | ICD-10-CM | POA: Diagnosis not present

## 2023-03-28 DIAGNOSIS — R0689 Other abnormalities of breathing: Secondary | ICD-10-CM | POA: Diagnosis not present

## 2023-03-28 DIAGNOSIS — Y92481 Parking lot as the place of occurrence of the external cause: Secondary | ICD-10-CM | POA: Diagnosis not present

## 2023-03-28 DIAGNOSIS — Y9301 Activity, walking, marching and hiking: Secondary | ICD-10-CM | POA: Diagnosis not present

## 2023-03-28 DIAGNOSIS — I454 Nonspecific intraventricular block: Secondary | ICD-10-CM | POA: Diagnosis not present

## 2023-03-28 DIAGNOSIS — E785 Hyperlipidemia, unspecified: Secondary | ICD-10-CM | POA: Diagnosis not present

## 2023-03-28 DIAGNOSIS — N184 Chronic kidney disease, stage 4 (severe): Secondary | ICD-10-CM | POA: Diagnosis not present

## 2023-03-28 DIAGNOSIS — N179 Acute kidney failure, unspecified: Secondary | ICD-10-CM | POA: Diagnosis not present

## 2023-03-28 DIAGNOSIS — D62 Acute posthemorrhagic anemia: Secondary | ICD-10-CM | POA: Diagnosis not present

## 2023-03-28 DIAGNOSIS — Z7984 Long term (current) use of oral hypoglycemic drugs: Secondary | ICD-10-CM | POA: Diagnosis not present

## 2023-03-28 DIAGNOSIS — R279 Unspecified lack of coordination: Secondary | ICD-10-CM | POA: Diagnosis not present

## 2023-03-28 DIAGNOSIS — N189 Chronic kidney disease, unspecified: Secondary | ICD-10-CM | POA: Diagnosis not present

## 2023-03-28 DIAGNOSIS — Z5948 Other specified lack of adequate food: Secondary | ICD-10-CM | POA: Diagnosis not present

## 2023-03-28 DIAGNOSIS — S298XXA Other specified injuries of thorax, initial encounter: Secondary | ICD-10-CM | POA: Diagnosis not present

## 2023-03-28 DIAGNOSIS — I350 Nonrheumatic aortic (valve) stenosis: Secondary | ICD-10-CM | POA: Diagnosis not present

## 2023-03-28 DIAGNOSIS — M6281 Muscle weakness (generalized): Secondary | ICD-10-CM | POA: Diagnosis not present

## 2023-03-28 DIAGNOSIS — W010XXA Fall on same level from slipping, tripping and stumbling without subsequent striking against object, initial encounter: Secondary | ICD-10-CM | POA: Diagnosis not present

## 2023-03-28 DIAGNOSIS — S36031A Moderate laceration of spleen, initial encounter: Secondary | ICD-10-CM | POA: Diagnosis not present

## 2023-03-28 DIAGNOSIS — E875 Hyperkalemia: Secondary | ICD-10-CM | POA: Diagnosis not present

## 2023-03-28 DIAGNOSIS — Y92480 Sidewalk as the place of occurrence of the external cause: Secondary | ICD-10-CM | POA: Diagnosis not present

## 2023-03-28 DIAGNOSIS — K661 Hemoperitoneum: Secondary | ICD-10-CM | POA: Diagnosis not present

## 2023-03-28 NOTE — Progress Notes (Deleted)
[address]       [place] [address]             [phone]   PCP is [facility] Referring Provider is [facility]  Chief Complaint:   HPI: This is an 82 year old male with a past medical history of hypertension, type 2 diabetes mellitus,  stage III chronic kidney disease and known moderate aortic stenosis with a bicuspid aortic valve that is being followed regularly by Dr. Wyline Mood.  [name] was also discovered on CT scan in June of this year to have a 10.7 x 2.6 cm spiculated right middle lobe mass that is PET-avid.  This is being evaluated by Dr. Shaune Leeks of [facility] in [place].    On most recent echocardiogram obtained in October of this year, the bicuspid aortic valve had an estimated area of **.  There was moderate aortic insufficiency.  The aortic root was dilated  to 42 mm.  Left ventricular ejection fraction was estimated at 55% with asymmetric left  ventricular hypertrophy involving primarily the interventricular septum.  Dr. Wyline Mood documented that Mr. Crapps was walking on a regular basis without chest pain or dyspnea and did not feel that any intervention to the aortic valve was indicated.  He was last seen in our office October 2023.   Past Medical History:  Diagnosis Date   Diabetes mellitus without complication (HCC)    Neuropathy     Past Surgical History:  Procedure Laterality Date   ROTATOR CUFF REPAIR Right    TOTAL KNEE ARTHROPLASTY      Family History  Problem Relation Age of Onset   Heart Problems Father    Heart attack Sister     Social History Social History   Tobacco Use   Smoking status: Some Days    Current packs/day: 0.10    Types: Cigarettes   Smokeless tobacco: Never   Tobacco comments:    4-5 ciggs per day  Substance Use Topics   Alcohol use: No    Current Outpatient Medications  Medication Sig Dispense Refill   albuterol (VENTOLIN HFA) 108 (90  Base) MCG/ACT inhaler Inhale 2 puffs into the lungs every 4 (four) hours as needed for shortness of breath or wheezing.     aspirin EC 81 MG tablet Take 1 tablet (81 mg total) by mouth daily. 90 tablet 3   atorvastatin (LIPITOR) 20 MG tablet Take 20 mg by mouth daily.     Cholecalciferol (VITAMIN D-3) 25 MCG (1000 UT) CAPS Take 1 capsule by mouth daily.     ezetimibe (ZETIA) 10 MG tablet Take 1 tablet by mouth daily.     Ferrous Sulfate Dried (HIGH POTENCY IRON) 65 MG TABS Take 1 tablet by mouth daily.     gabapentin (NEURONTIN) 100 MG capsule Take 100 mg by mouth 2 (two) times daily.     glipiZIDE (GLUCOTROL) 5 MG tablet Take 5 mg by mouth every morning.     losartan (COZAAR) 25 MG tablet Take 12.5 mg by mouth daily.     mirtazapine (REMERON) 15 MG tablet Take 15 mg by mouth at bedtime.     ONETOUCH VERIO test strip 1 each by Other route as needed for other.     pantoprazole (PROTONIX) 40 MG tablet Take 40 mg by mouth daily.     pioglitazone (ACTOS) 30 MG tablet Take 30 mg by mouth daily.    Allergies: No Known Allergies  Review of Systems Chest Pain [  ] Resting SOB [ ]  Exertional SOB [  ]  Pedal Edema [  ] Syncope [  ] Presyncope [  ]  General Review of Systems: [Y] = yes [ N]=no  Consitutional:   nausea [ ] ;  fever [ ] ;  Eye : blurred vision [ ] ; Amaurosis fugax[  ];  Resp: cough [ ] ;  hemoptysis[ ] ;  GI: vomiting[ ] ; melena[ ] ; hematochezia [] ;  [id number] ; Musculoskeletal: myalgias[ ] ; joint swelling[  ]; joint erythema[ ] ;  Heme/Lymph: anemia[ ] ;  Neuro: TIA[ ] ;stroke[ ] ;  seizures[ ] ;  Endocrine: diabetes[ ] ;   Vital Signs:  Physical Exam: CV Neck Pulmonary Abdomen Extremities Neurologic   Diagnostic Tests:      Ardelle Balls, PA-C [facility] [phone]

## 2023-04-03 ENCOUNTER — Other Ambulatory Visit: Payer: Medicare HMO

## 2023-04-04 ENCOUNTER — Encounter: Payer: Self-pay | Admitting: Surgery

## 2023-04-04 ENCOUNTER — Ambulatory Visit: Payer: Medicare HMO

## 2023-04-04 DIAGNOSIS — I739 Peripheral vascular disease, unspecified: Secondary | ICD-10-CM | POA: Diagnosis not present

## 2023-04-04 DIAGNOSIS — G909 Disorder of the autonomic nervous system, unspecified: Secondary | ICD-10-CM | POA: Diagnosis not present

## 2023-04-04 DIAGNOSIS — R0902 Hypoxemia: Secondary | ICD-10-CM | POA: Diagnosis not present

## 2023-04-04 DIAGNOSIS — E782 Mixed hyperlipidemia: Secondary | ICD-10-CM | POA: Diagnosis not present

## 2023-04-04 DIAGNOSIS — R2689 Other abnormalities of gait and mobility: Secondary | ICD-10-CM | POA: Diagnosis not present

## 2023-04-04 DIAGNOSIS — I129 Hypertensive chronic kidney disease with stage 1 through stage 4 chronic kidney disease, or unspecified chronic kidney disease: Secondary | ICD-10-CM | POA: Diagnosis not present

## 2023-04-04 DIAGNOSIS — R262 Difficulty in walking, not elsewhere classified: Secondary | ICD-10-CM | POA: Diagnosis not present

## 2023-04-04 DIAGNOSIS — D735 Infarction of spleen: Secondary | ICD-10-CM | POA: Diagnosis not present

## 2023-04-04 DIAGNOSIS — J432 Centrilobular emphysema: Secondary | ICD-10-CM | POA: Diagnosis not present

## 2023-04-04 DIAGNOSIS — R0602 Shortness of breath: Secondary | ICD-10-CM | POA: Diagnosis not present

## 2023-04-04 DIAGNOSIS — G629 Polyneuropathy, unspecified: Secondary | ICD-10-CM | POA: Diagnosis not present

## 2023-04-04 DIAGNOSIS — R279 Unspecified lack of coordination: Secondary | ICD-10-CM | POA: Diagnosis not present

## 2023-04-04 DIAGNOSIS — R5381 Other malaise: Secondary | ICD-10-CM | POA: Diagnosis not present

## 2023-04-04 DIAGNOSIS — F329 Major depressive disorder, single episode, unspecified: Secondary | ICD-10-CM | POA: Diagnosis not present

## 2023-04-04 DIAGNOSIS — J449 Chronic obstructive pulmonary disease, unspecified: Secondary | ICD-10-CM | POA: Diagnosis not present

## 2023-04-04 DIAGNOSIS — S2242XD Multiple fractures of ribs, left side, subsequent encounter for fracture with routine healing: Secondary | ICD-10-CM | POA: Diagnosis not present

## 2023-04-04 DIAGNOSIS — Z743 Need for continuous supervision: Secondary | ICD-10-CM | POA: Diagnosis not present

## 2023-04-04 DIAGNOSIS — F330 Major depressive disorder, recurrent, mild: Secondary | ICD-10-CM | POA: Diagnosis not present

## 2023-04-04 DIAGNOSIS — F419 Anxiety disorder, unspecified: Secondary | ICD-10-CM | POA: Diagnosis not present

## 2023-04-04 DIAGNOSIS — F4322 Adjustment disorder with anxiety: Secondary | ICD-10-CM | POA: Diagnosis not present

## 2023-04-04 DIAGNOSIS — E119 Type 2 diabetes mellitus without complications: Secondary | ICD-10-CM | POA: Diagnosis not present

## 2023-04-04 DIAGNOSIS — J9601 Acute respiratory failure with hypoxia: Secondary | ICD-10-CM | POA: Diagnosis not present

## 2023-04-04 DIAGNOSIS — M6281 Muscle weakness (generalized): Secondary | ICD-10-CM | POA: Diagnosis not present

## 2023-04-04 DIAGNOSIS — S36039A Unspecified laceration of spleen, initial encounter: Secondary | ICD-10-CM | POA: Diagnosis not present

## 2023-04-04 DIAGNOSIS — K2100 Gastro-esophageal reflux disease with esophagitis, without bleeding: Secondary | ICD-10-CM | POA: Diagnosis not present

## 2023-04-04 DIAGNOSIS — G4700 Insomnia, unspecified: Secondary | ICD-10-CM | POA: Diagnosis not present

## 2023-04-04 DIAGNOSIS — J430 Unilateral pulmonary emphysema [MacLeod's syndrome]: Secondary | ICD-10-CM | POA: Diagnosis not present

## 2023-04-04 DIAGNOSIS — K5909 Other constipation: Secondary | ICD-10-CM | POA: Diagnosis not present

## 2023-04-05 ENCOUNTER — Other Ambulatory Visit: Payer: Medicare HMO

## 2023-04-05 DIAGNOSIS — G909 Disorder of the autonomic nervous system, unspecified: Secondary | ICD-10-CM | POA: Diagnosis not present

## 2023-04-05 DIAGNOSIS — K5909 Other constipation: Secondary | ICD-10-CM | POA: Diagnosis not present

## 2023-04-05 DIAGNOSIS — D735 Infarction of spleen: Secondary | ICD-10-CM | POA: Diagnosis not present

## 2023-04-05 DIAGNOSIS — F329 Major depressive disorder, single episode, unspecified: Secondary | ICD-10-CM | POA: Diagnosis not present

## 2023-04-05 DIAGNOSIS — K2100 Gastro-esophageal reflux disease with esophagitis, without bleeding: Secondary | ICD-10-CM | POA: Diagnosis not present

## 2023-04-05 DIAGNOSIS — R5381 Other malaise: Secondary | ICD-10-CM | POA: Diagnosis not present

## 2023-04-05 DIAGNOSIS — E782 Mixed hyperlipidemia: Secondary | ICD-10-CM | POA: Diagnosis not present

## 2023-04-05 DIAGNOSIS — E119 Type 2 diabetes mellitus without complications: Secondary | ICD-10-CM | POA: Diagnosis not present

## 2023-04-06 DIAGNOSIS — E119 Type 2 diabetes mellitus without complications: Secondary | ICD-10-CM | POA: Diagnosis not present

## 2023-04-06 DIAGNOSIS — K2100 Gastro-esophageal reflux disease with esophagitis, without bleeding: Secondary | ICD-10-CM | POA: Diagnosis not present

## 2023-04-06 DIAGNOSIS — E782 Mixed hyperlipidemia: Secondary | ICD-10-CM | POA: Diagnosis not present

## 2023-04-06 DIAGNOSIS — J432 Centrilobular emphysema: Secondary | ICD-10-CM | POA: Diagnosis not present

## 2023-04-06 DIAGNOSIS — G629 Polyneuropathy, unspecified: Secondary | ICD-10-CM | POA: Diagnosis not present

## 2023-04-06 DIAGNOSIS — F329 Major depressive disorder, single episode, unspecified: Secondary | ICD-10-CM | POA: Diagnosis not present

## 2023-04-06 DIAGNOSIS — I739 Peripheral vascular disease, unspecified: Secondary | ICD-10-CM | POA: Diagnosis not present

## 2023-04-07 DIAGNOSIS — J432 Centrilobular emphysema: Secondary | ICD-10-CM | POA: Diagnosis not present

## 2023-04-07 DIAGNOSIS — G629 Polyneuropathy, unspecified: Secondary | ICD-10-CM | POA: Diagnosis not present

## 2023-04-07 DIAGNOSIS — R5381 Other malaise: Secondary | ICD-10-CM | POA: Diagnosis not present

## 2023-04-07 DIAGNOSIS — E119 Type 2 diabetes mellitus without complications: Secondary | ICD-10-CM | POA: Diagnosis not present

## 2023-04-07 DIAGNOSIS — I739 Peripheral vascular disease, unspecified: Secondary | ICD-10-CM | POA: Diagnosis not present

## 2023-04-07 DIAGNOSIS — F329 Major depressive disorder, single episode, unspecified: Secondary | ICD-10-CM | POA: Diagnosis not present

## 2023-04-07 DIAGNOSIS — K2100 Gastro-esophageal reflux disease with esophagitis, without bleeding: Secondary | ICD-10-CM | POA: Diagnosis not present

## 2023-04-07 DIAGNOSIS — E782 Mixed hyperlipidemia: Secondary | ICD-10-CM | POA: Diagnosis not present

## 2023-04-10 DIAGNOSIS — G629 Polyneuropathy, unspecified: Secondary | ICD-10-CM | POA: Diagnosis not present

## 2023-04-10 DIAGNOSIS — J432 Centrilobular emphysema: Secondary | ICD-10-CM | POA: Diagnosis not present

## 2023-04-10 DIAGNOSIS — I739 Peripheral vascular disease, unspecified: Secondary | ICD-10-CM | POA: Diagnosis not present

## 2023-04-10 DIAGNOSIS — F329 Major depressive disorder, single episode, unspecified: Secondary | ICD-10-CM | POA: Diagnosis not present

## 2023-04-10 DIAGNOSIS — E119 Type 2 diabetes mellitus without complications: Secondary | ICD-10-CM | POA: Diagnosis not present

## 2023-04-10 DIAGNOSIS — K2100 Gastro-esophageal reflux disease with esophagitis, without bleeding: Secondary | ICD-10-CM | POA: Diagnosis not present

## 2023-04-10 DIAGNOSIS — E782 Mixed hyperlipidemia: Secondary | ICD-10-CM | POA: Diagnosis not present

## 2023-04-10 DIAGNOSIS — R5381 Other malaise: Secondary | ICD-10-CM | POA: Diagnosis not present

## 2023-04-12 DIAGNOSIS — J432 Centrilobular emphysema: Secondary | ICD-10-CM | POA: Diagnosis not present

## 2023-04-12 DIAGNOSIS — G629 Polyneuropathy, unspecified: Secondary | ICD-10-CM | POA: Diagnosis not present

## 2023-04-12 DIAGNOSIS — K2100 Gastro-esophageal reflux disease with esophagitis, without bleeding: Secondary | ICD-10-CM | POA: Diagnosis not present

## 2023-04-12 DIAGNOSIS — I739 Peripheral vascular disease, unspecified: Secondary | ICD-10-CM | POA: Diagnosis not present

## 2023-04-12 DIAGNOSIS — E782 Mixed hyperlipidemia: Secondary | ICD-10-CM | POA: Diagnosis not present

## 2023-04-12 DIAGNOSIS — R5381 Other malaise: Secondary | ICD-10-CM | POA: Diagnosis not present

## 2023-04-12 DIAGNOSIS — F329 Major depressive disorder, single episode, unspecified: Secondary | ICD-10-CM | POA: Diagnosis not present

## 2023-04-12 DIAGNOSIS — E119 Type 2 diabetes mellitus without complications: Secondary | ICD-10-CM | POA: Diagnosis not present

## 2023-04-14 DIAGNOSIS — F329 Major depressive disorder, single episode, unspecified: Secondary | ICD-10-CM | POA: Diagnosis not present

## 2023-04-14 DIAGNOSIS — E119 Type 2 diabetes mellitus without complications: Secondary | ICD-10-CM | POA: Diagnosis not present

## 2023-04-14 DIAGNOSIS — R5381 Other malaise: Secondary | ICD-10-CM | POA: Diagnosis not present

## 2023-04-14 DIAGNOSIS — G629 Polyneuropathy, unspecified: Secondary | ICD-10-CM | POA: Diagnosis not present

## 2023-04-14 DIAGNOSIS — J432 Centrilobular emphysema: Secondary | ICD-10-CM | POA: Diagnosis not present

## 2023-04-14 DIAGNOSIS — I739 Peripheral vascular disease, unspecified: Secondary | ICD-10-CM | POA: Diagnosis not present

## 2023-04-14 DIAGNOSIS — K2100 Gastro-esophageal reflux disease with esophagitis, without bleeding: Secondary | ICD-10-CM | POA: Diagnosis not present

## 2023-04-14 DIAGNOSIS — E782 Mixed hyperlipidemia: Secondary | ICD-10-CM | POA: Diagnosis not present

## 2023-04-17 DIAGNOSIS — E782 Mixed hyperlipidemia: Secondary | ICD-10-CM | POA: Diagnosis not present

## 2023-04-17 DIAGNOSIS — J432 Centrilobular emphysema: Secondary | ICD-10-CM | POA: Diagnosis not present

## 2023-04-17 DIAGNOSIS — I739 Peripheral vascular disease, unspecified: Secondary | ICD-10-CM | POA: Diagnosis not present

## 2023-04-17 DIAGNOSIS — F329 Major depressive disorder, single episode, unspecified: Secondary | ICD-10-CM | POA: Diagnosis not present

## 2023-04-17 DIAGNOSIS — G629 Polyneuropathy, unspecified: Secondary | ICD-10-CM | POA: Diagnosis not present

## 2023-04-17 DIAGNOSIS — K2100 Gastro-esophageal reflux disease with esophagitis, without bleeding: Secondary | ICD-10-CM | POA: Diagnosis not present

## 2023-04-17 DIAGNOSIS — E119 Type 2 diabetes mellitus without complications: Secondary | ICD-10-CM | POA: Diagnosis not present

## 2023-04-17 DIAGNOSIS — R5381 Other malaise: Secondary | ICD-10-CM | POA: Diagnosis not present

## 2023-04-19 DIAGNOSIS — K2100 Gastro-esophageal reflux disease with esophagitis, without bleeding: Secondary | ICD-10-CM | POA: Diagnosis not present

## 2023-04-19 DIAGNOSIS — J432 Centrilobular emphysema: Secondary | ICD-10-CM | POA: Diagnosis not present

## 2023-04-19 DIAGNOSIS — E782 Mixed hyperlipidemia: Secondary | ICD-10-CM | POA: Diagnosis not present

## 2023-04-19 DIAGNOSIS — G629 Polyneuropathy, unspecified: Secondary | ICD-10-CM | POA: Diagnosis not present

## 2023-04-19 DIAGNOSIS — E119 Type 2 diabetes mellitus without complications: Secondary | ICD-10-CM | POA: Diagnosis not present

## 2023-04-19 DIAGNOSIS — R5381 Other malaise: Secondary | ICD-10-CM | POA: Diagnosis not present

## 2023-04-19 DIAGNOSIS — F329 Major depressive disorder, single episode, unspecified: Secondary | ICD-10-CM | POA: Diagnosis not present

## 2023-04-19 DIAGNOSIS — I739 Peripheral vascular disease, unspecified: Secondary | ICD-10-CM | POA: Diagnosis not present

## 2023-04-21 DIAGNOSIS — J432 Centrilobular emphysema: Secondary | ICD-10-CM | POA: Diagnosis not present

## 2023-04-21 DIAGNOSIS — G629 Polyneuropathy, unspecified: Secondary | ICD-10-CM | POA: Diagnosis not present

## 2023-04-21 DIAGNOSIS — E119 Type 2 diabetes mellitus without complications: Secondary | ICD-10-CM | POA: Diagnosis not present

## 2023-04-21 DIAGNOSIS — R5381 Other malaise: Secondary | ICD-10-CM | POA: Diagnosis not present

## 2023-04-21 DIAGNOSIS — K2100 Gastro-esophageal reflux disease with esophagitis, without bleeding: Secondary | ICD-10-CM | POA: Diagnosis not present

## 2023-04-21 DIAGNOSIS — F329 Major depressive disorder, single episode, unspecified: Secondary | ICD-10-CM | POA: Diagnosis not present

## 2023-04-21 DIAGNOSIS — I739 Peripheral vascular disease, unspecified: Secondary | ICD-10-CM | POA: Diagnosis not present

## 2023-04-21 DIAGNOSIS — E782 Mixed hyperlipidemia: Secondary | ICD-10-CM | POA: Diagnosis not present

## 2023-04-24 DIAGNOSIS — J432 Centrilobular emphysema: Secondary | ICD-10-CM | POA: Diagnosis not present

## 2023-04-24 DIAGNOSIS — G629 Polyneuropathy, unspecified: Secondary | ICD-10-CM | POA: Diagnosis not present

## 2023-04-24 DIAGNOSIS — K2100 Gastro-esophageal reflux disease with esophagitis, without bleeding: Secondary | ICD-10-CM | POA: Diagnosis not present

## 2023-04-24 DIAGNOSIS — F329 Major depressive disorder, single episode, unspecified: Secondary | ICD-10-CM | POA: Diagnosis not present

## 2023-04-24 DIAGNOSIS — R5381 Other malaise: Secondary | ICD-10-CM | POA: Diagnosis not present

## 2023-04-24 DIAGNOSIS — E119 Type 2 diabetes mellitus without complications: Secondary | ICD-10-CM | POA: Diagnosis not present

## 2023-04-24 DIAGNOSIS — I739 Peripheral vascular disease, unspecified: Secondary | ICD-10-CM | POA: Diagnosis not present

## 2023-04-24 DIAGNOSIS — E782 Mixed hyperlipidemia: Secondary | ICD-10-CM | POA: Diagnosis not present

## 2023-04-26 DIAGNOSIS — I739 Peripheral vascular disease, unspecified: Secondary | ICD-10-CM | POA: Diagnosis not present

## 2023-04-26 DIAGNOSIS — K2100 Gastro-esophageal reflux disease with esophagitis, without bleeding: Secondary | ICD-10-CM | POA: Diagnosis not present

## 2023-04-26 DIAGNOSIS — R5381 Other malaise: Secondary | ICD-10-CM | POA: Diagnosis not present

## 2023-04-26 DIAGNOSIS — E119 Type 2 diabetes mellitus without complications: Secondary | ICD-10-CM | POA: Diagnosis not present

## 2023-04-26 DIAGNOSIS — E782 Mixed hyperlipidemia: Secondary | ICD-10-CM | POA: Diagnosis not present

## 2023-04-26 DIAGNOSIS — F329 Major depressive disorder, single episode, unspecified: Secondary | ICD-10-CM | POA: Diagnosis not present

## 2023-04-26 DIAGNOSIS — J432 Centrilobular emphysema: Secondary | ICD-10-CM | POA: Diagnosis not present

## 2023-04-26 DIAGNOSIS — G629 Polyneuropathy, unspecified: Secondary | ICD-10-CM | POA: Diagnosis not present

## 2023-04-28 DIAGNOSIS — I129 Hypertensive chronic kidney disease with stage 1 through stage 4 chronic kidney disease, or unspecified chronic kidney disease: Secondary | ICD-10-CM | POA: Diagnosis not present

## 2023-04-28 DIAGNOSIS — E782 Mixed hyperlipidemia: Secondary | ICD-10-CM | POA: Diagnosis not present

## 2023-04-28 DIAGNOSIS — J432 Centrilobular emphysema: Secondary | ICD-10-CM | POA: Diagnosis not present

## 2023-04-28 DIAGNOSIS — J9601 Acute respiratory failure with hypoxia: Secondary | ICD-10-CM | POA: Diagnosis not present

## 2023-04-28 DIAGNOSIS — J449 Chronic obstructive pulmonary disease, unspecified: Secondary | ICD-10-CM | POA: Diagnosis not present

## 2023-04-28 DIAGNOSIS — M6281 Muscle weakness (generalized): Secondary | ICD-10-CM | POA: Diagnosis not present

## 2023-04-28 DIAGNOSIS — J430 Unilateral pulmonary emphysema [MacLeod's syndrome]: Secondary | ICD-10-CM | POA: Diagnosis not present

## 2023-04-28 DIAGNOSIS — K2100 Gastro-esophageal reflux disease with esophagitis, without bleeding: Secondary | ICD-10-CM | POA: Diagnosis not present

## 2023-04-28 DIAGNOSIS — I739 Peripheral vascular disease, unspecified: Secondary | ICD-10-CM | POA: Diagnosis not present

## 2023-04-28 DIAGNOSIS — R2689 Other abnormalities of gait and mobility: Secondary | ICD-10-CM | POA: Diagnosis not present

## 2023-04-28 DIAGNOSIS — R0602 Shortness of breath: Secondary | ICD-10-CM | POA: Diagnosis not present

## 2023-04-28 DIAGNOSIS — G629 Polyneuropathy, unspecified: Secondary | ICD-10-CM | POA: Diagnosis not present

## 2023-04-28 DIAGNOSIS — R0902 Hypoxemia: Secondary | ICD-10-CM | POA: Diagnosis not present

## 2023-04-28 DIAGNOSIS — E119 Type 2 diabetes mellitus without complications: Secondary | ICD-10-CM | POA: Diagnosis not present

## 2023-04-28 DIAGNOSIS — R5381 Other malaise: Secondary | ICD-10-CM | POA: Diagnosis not present

## 2023-04-28 DIAGNOSIS — R262 Difficulty in walking, not elsewhere classified: Secondary | ICD-10-CM | POA: Diagnosis not present

## 2023-04-28 DIAGNOSIS — F329 Major depressive disorder, single episode, unspecified: Secondary | ICD-10-CM | POA: Diagnosis not present

## 2023-05-01 DIAGNOSIS — J432 Centrilobular emphysema: Secondary | ICD-10-CM | POA: Diagnosis not present

## 2023-05-01 DIAGNOSIS — Z85118 Personal history of other malignant neoplasm of bronchus and lung: Secondary | ICD-10-CM | POA: Diagnosis not present

## 2023-05-01 DIAGNOSIS — S2242XD Multiple fractures of ribs, left side, subsequent encounter for fracture with routine healing: Secondary | ICD-10-CM | POA: Diagnosis not present

## 2023-05-01 DIAGNOSIS — E1143 Type 2 diabetes mellitus with diabetic autonomic (poly)neuropathy: Secondary | ICD-10-CM | POA: Diagnosis not present

## 2023-05-01 DIAGNOSIS — E1122 Type 2 diabetes mellitus with diabetic chronic kidney disease: Secondary | ICD-10-CM | POA: Diagnosis not present

## 2023-05-01 DIAGNOSIS — I7090 Unspecified atherosclerosis: Secondary | ICD-10-CM | POA: Diagnosis not present

## 2023-05-01 DIAGNOSIS — Z7982 Long term (current) use of aspirin: Secondary | ICD-10-CM | POA: Diagnosis not present

## 2023-05-01 DIAGNOSIS — Z7984 Long term (current) use of oral hypoglycemic drugs: Secondary | ICD-10-CM | POA: Diagnosis not present

## 2023-05-01 DIAGNOSIS — N179 Acute kidney failure, unspecified: Secondary | ICD-10-CM | POA: Diagnosis not present

## 2023-05-01 DIAGNOSIS — F329 Major depressive disorder, single episode, unspecified: Secondary | ICD-10-CM | POA: Diagnosis not present

## 2023-05-01 DIAGNOSIS — N189 Chronic kidney disease, unspecified: Secondary | ICD-10-CM | POA: Diagnosis not present

## 2023-05-01 DIAGNOSIS — Z602 Problems related to living alone: Secondary | ICD-10-CM | POA: Diagnosis not present

## 2023-05-01 DIAGNOSIS — K5909 Other constipation: Secondary | ICD-10-CM | POA: Diagnosis not present

## 2023-05-01 DIAGNOSIS — E1151 Type 2 diabetes mellitus with diabetic peripheral angiopathy without gangrene: Secondary | ICD-10-CM | POA: Diagnosis not present

## 2023-05-01 DIAGNOSIS — Z9181 History of falling: Secondary | ICD-10-CM | POA: Diagnosis not present

## 2023-05-02 DIAGNOSIS — M1711 Unilateral primary osteoarthritis, right knee: Secondary | ICD-10-CM | POA: Diagnosis not present

## 2023-05-02 DIAGNOSIS — M1712 Unilateral primary osteoarthritis, left knee: Secondary | ICD-10-CM | POA: Diagnosis not present

## 2023-05-02 DIAGNOSIS — M25562 Pain in left knee: Secondary | ICD-10-CM | POA: Diagnosis not present

## 2023-05-02 DIAGNOSIS — M25561 Pain in right knee: Secondary | ICD-10-CM | POA: Diagnosis not present

## 2023-05-02 DIAGNOSIS — M25461 Effusion, right knee: Secondary | ICD-10-CM | POA: Diagnosis not present

## 2023-05-04 DIAGNOSIS — E1122 Type 2 diabetes mellitus with diabetic chronic kidney disease: Secondary | ICD-10-CM | POA: Diagnosis not present

## 2023-05-04 DIAGNOSIS — Z7984 Long term (current) use of oral hypoglycemic drugs: Secondary | ICD-10-CM | POA: Diagnosis not present

## 2023-05-04 DIAGNOSIS — E1143 Type 2 diabetes mellitus with diabetic autonomic (poly)neuropathy: Secondary | ICD-10-CM | POA: Diagnosis not present

## 2023-05-04 DIAGNOSIS — S2242XD Multiple fractures of ribs, left side, subsequent encounter for fracture with routine healing: Secondary | ICD-10-CM | POA: Diagnosis not present

## 2023-05-04 DIAGNOSIS — Z602 Problems related to living alone: Secondary | ICD-10-CM | POA: Diagnosis not present

## 2023-05-04 DIAGNOSIS — F329 Major depressive disorder, single episode, unspecified: Secondary | ICD-10-CM | POA: Diagnosis not present

## 2023-05-04 DIAGNOSIS — K5909 Other constipation: Secondary | ICD-10-CM | POA: Diagnosis not present

## 2023-05-04 DIAGNOSIS — N189 Chronic kidney disease, unspecified: Secondary | ICD-10-CM | POA: Diagnosis not present

## 2023-05-04 DIAGNOSIS — I7090 Unspecified atherosclerosis: Secondary | ICD-10-CM | POA: Diagnosis not present

## 2023-05-04 DIAGNOSIS — E1151 Type 2 diabetes mellitus with diabetic peripheral angiopathy without gangrene: Secondary | ICD-10-CM | POA: Diagnosis not present

## 2023-05-04 DIAGNOSIS — Z9181 History of falling: Secondary | ICD-10-CM | POA: Diagnosis not present

## 2023-05-04 DIAGNOSIS — Z7982 Long term (current) use of aspirin: Secondary | ICD-10-CM | POA: Diagnosis not present

## 2023-05-04 DIAGNOSIS — Z85118 Personal history of other malignant neoplasm of bronchus and lung: Secondary | ICD-10-CM | POA: Diagnosis not present

## 2023-05-04 DIAGNOSIS — J432 Centrilobular emphysema: Secondary | ICD-10-CM | POA: Diagnosis not present

## 2023-05-04 DIAGNOSIS — N179 Acute kidney failure, unspecified: Secondary | ICD-10-CM | POA: Diagnosis not present

## 2023-05-08 ENCOUNTER — Ambulatory Visit: Payer: Medicare HMO | Attending: Cardiology

## 2023-05-08 DIAGNOSIS — I350 Nonrheumatic aortic (valve) stenosis: Secondary | ICD-10-CM

## 2023-05-09 DIAGNOSIS — N179 Acute kidney failure, unspecified: Secondary | ICD-10-CM | POA: Diagnosis not present

## 2023-05-09 DIAGNOSIS — E1151 Type 2 diabetes mellitus with diabetic peripheral angiopathy without gangrene: Secondary | ICD-10-CM | POA: Diagnosis not present

## 2023-05-09 DIAGNOSIS — Z7982 Long term (current) use of aspirin: Secondary | ICD-10-CM | POA: Diagnosis not present

## 2023-05-09 DIAGNOSIS — E1122 Type 2 diabetes mellitus with diabetic chronic kidney disease: Secondary | ICD-10-CM | POA: Diagnosis not present

## 2023-05-09 DIAGNOSIS — N189 Chronic kidney disease, unspecified: Secondary | ICD-10-CM | POA: Diagnosis not present

## 2023-05-09 DIAGNOSIS — Z7984 Long term (current) use of oral hypoglycemic drugs: Secondary | ICD-10-CM | POA: Diagnosis not present

## 2023-05-09 DIAGNOSIS — S2242XD Multiple fractures of ribs, left side, subsequent encounter for fracture with routine healing: Secondary | ICD-10-CM | POA: Diagnosis not present

## 2023-05-09 DIAGNOSIS — F329 Major depressive disorder, single episode, unspecified: Secondary | ICD-10-CM | POA: Diagnosis not present

## 2023-05-09 DIAGNOSIS — J432 Centrilobular emphysema: Secondary | ICD-10-CM | POA: Diagnosis not present

## 2023-05-09 DIAGNOSIS — E1143 Type 2 diabetes mellitus with diabetic autonomic (poly)neuropathy: Secondary | ICD-10-CM | POA: Diagnosis not present

## 2023-05-09 DIAGNOSIS — K5909 Other constipation: Secondary | ICD-10-CM | POA: Diagnosis not present

## 2023-05-09 DIAGNOSIS — Z9181 History of falling: Secondary | ICD-10-CM | POA: Diagnosis not present

## 2023-05-09 DIAGNOSIS — Z602 Problems related to living alone: Secondary | ICD-10-CM | POA: Diagnosis not present

## 2023-05-09 DIAGNOSIS — I7090 Unspecified atherosclerosis: Secondary | ICD-10-CM | POA: Diagnosis not present

## 2023-05-09 DIAGNOSIS — Z85118 Personal history of other malignant neoplasm of bronchus and lung: Secondary | ICD-10-CM | POA: Diagnosis not present

## 2023-05-09 LAB — ECHOCARDIOGRAM COMPLETE
AR max vel: 0.79 cm2
AV Area VTI: 0.93 cm2
AV Area mean vel: 0.8 cm2
AV Mean grad: 21 mm[Hg]
AV Peak grad: 45.3 mm[Hg]
Ao pk vel: 3.37 m/s
Area-P 1/2: 5.62 cm2
Calc EF: 50.5 %
MV VTI: 1.03 cm2
P 1/2 time: 443 ms
S' Lateral: 3.5 cm
Single Plane A2C EF: 49.1 %
Single Plane A4C EF: 50.4 %

## 2023-05-10 DIAGNOSIS — S32018A Other fracture of first lumbar vertebra, initial encounter for closed fracture: Secondary | ICD-10-CM | POA: Diagnosis not present

## 2023-05-10 DIAGNOSIS — M4316 Spondylolisthesis, lumbar region: Secondary | ICD-10-CM | POA: Diagnosis not present

## 2023-05-10 DIAGNOSIS — J984 Other disorders of lung: Secondary | ICD-10-CM | POA: Diagnosis not present

## 2023-05-10 DIAGNOSIS — I700 Atherosclerosis of aorta: Secondary | ICD-10-CM | POA: Diagnosis not present

## 2023-05-10 DIAGNOSIS — Z743 Need for continuous supervision: Secondary | ICD-10-CM | POA: Diagnosis not present

## 2023-05-10 DIAGNOSIS — R0989 Other specified symptoms and signs involving the circulatory and respiratory systems: Secondary | ICD-10-CM | POA: Diagnosis not present

## 2023-05-10 DIAGNOSIS — Z20822 Contact with and (suspected) exposure to covid-19: Secondary | ICD-10-CM | POA: Diagnosis not present

## 2023-05-10 DIAGNOSIS — R296 Repeated falls: Secondary | ICD-10-CM | POA: Diagnosis not present

## 2023-05-10 DIAGNOSIS — M549 Dorsalgia, unspecified: Secondary | ICD-10-CM | POA: Diagnosis not present

## 2023-05-10 DIAGNOSIS — M8580 Other specified disorders of bone density and structure, unspecified site: Secondary | ICD-10-CM | POA: Diagnosis not present

## 2023-05-10 DIAGNOSIS — M4807 Spinal stenosis, lumbosacral region: Secondary | ICD-10-CM | POA: Diagnosis not present

## 2023-05-10 DIAGNOSIS — M51369 Other intervertebral disc degeneration, lumbar region without mention of lumbar back pain or lower extremity pain: Secondary | ICD-10-CM | POA: Diagnosis not present

## 2023-05-10 DIAGNOSIS — R531 Weakness: Secondary | ICD-10-CM | POA: Diagnosis not present

## 2023-05-10 DIAGNOSIS — R739 Hyperglycemia, unspecified: Secondary | ICD-10-CM | POA: Diagnosis not present

## 2023-05-10 DIAGNOSIS — M160 Bilateral primary osteoarthritis of hip: Secondary | ICD-10-CM | POA: Diagnosis not present

## 2023-05-10 DIAGNOSIS — S3993XA Unspecified injury of pelvis, initial encounter: Secondary | ICD-10-CM | POA: Diagnosis not present

## 2023-05-10 DIAGNOSIS — W19XXXA Unspecified fall, initial encounter: Secondary | ICD-10-CM | POA: Diagnosis not present

## 2023-05-10 DIAGNOSIS — M47816 Spondylosis without myelopathy or radiculopathy, lumbar region: Secondary | ICD-10-CM | POA: Diagnosis not present

## 2023-05-10 DIAGNOSIS — R918 Other nonspecific abnormal finding of lung field: Secondary | ICD-10-CM | POA: Diagnosis not present

## 2023-05-10 DIAGNOSIS — J90 Pleural effusion, not elsewhere classified: Secondary | ICD-10-CM | POA: Diagnosis not present

## 2023-05-10 DIAGNOSIS — M5126 Other intervertebral disc displacement, lumbar region: Secondary | ICD-10-CM | POA: Diagnosis not present

## 2023-05-10 DIAGNOSIS — M545 Low back pain, unspecified: Secondary | ICD-10-CM | POA: Diagnosis not present

## 2023-05-10 DIAGNOSIS — S2242XD Multiple fractures of ribs, left side, subsequent encounter for fracture with routine healing: Secondary | ICD-10-CM | POA: Diagnosis not present

## 2023-05-11 DIAGNOSIS — M5126 Other intervertebral disc displacement, lumbar region: Secondary | ICD-10-CM | POA: Diagnosis not present

## 2023-05-11 DIAGNOSIS — E785 Hyperlipidemia, unspecified: Secondary | ICD-10-CM | POA: Diagnosis not present

## 2023-05-11 DIAGNOSIS — Z79899 Other long term (current) drug therapy: Secondary | ICD-10-CM | POA: Diagnosis not present

## 2023-05-11 DIAGNOSIS — J90 Pleural effusion, not elsewhere classified: Secondary | ICD-10-CM | POA: Diagnosis not present

## 2023-05-11 DIAGNOSIS — I700 Atherosclerosis of aorta: Secondary | ICD-10-CM | POA: Diagnosis not present

## 2023-05-11 DIAGNOSIS — J984 Other disorders of lung: Secondary | ICD-10-CM | POA: Diagnosis not present

## 2023-05-11 DIAGNOSIS — R918 Other nonspecific abnormal finding of lung field: Secondary | ICD-10-CM | POA: Diagnosis not present

## 2023-05-11 DIAGNOSIS — E119 Type 2 diabetes mellitus without complications: Secondary | ICD-10-CM | POA: Diagnosis not present

## 2023-05-11 DIAGNOSIS — J449 Chronic obstructive pulmonary disease, unspecified: Secondary | ICD-10-CM | POA: Diagnosis not present

## 2023-05-11 DIAGNOSIS — R0989 Other specified symptoms and signs involving the circulatory and respiratory systems: Secondary | ICD-10-CM | POA: Diagnosis not present

## 2023-05-11 DIAGNOSIS — M4807 Spinal stenosis, lumbosacral region: Secondary | ICD-10-CM | POA: Diagnosis not present

## 2023-05-11 DIAGNOSIS — M549 Dorsalgia, unspecified: Secondary | ICD-10-CM | POA: Diagnosis not present

## 2023-05-11 DIAGNOSIS — S36031A Moderate laceration of spleen, initial encounter: Secondary | ICD-10-CM | POA: Diagnosis not present

## 2023-05-11 DIAGNOSIS — S32010D Wedge compression fracture of first lumbar vertebra, subsequent encounter for fracture with routine healing: Secondary | ICD-10-CM | POA: Diagnosis not present

## 2023-05-12 DIAGNOSIS — X58XXXD Exposure to other specified factors, subsequent encounter: Secondary | ICD-10-CM | POA: Diagnosis not present

## 2023-05-12 DIAGNOSIS — E119 Type 2 diabetes mellitus without complications: Secondary | ICD-10-CM | POA: Diagnosis not present

## 2023-05-12 DIAGNOSIS — J449 Chronic obstructive pulmonary disease, unspecified: Secondary | ICD-10-CM | POA: Diagnosis not present

## 2023-05-12 DIAGNOSIS — S32010D Wedge compression fracture of first lumbar vertebra, subsequent encounter for fracture with routine healing: Secondary | ICD-10-CM | POA: Diagnosis not present

## 2023-05-12 DIAGNOSIS — E785 Hyperlipidemia, unspecified: Secondary | ICD-10-CM | POA: Diagnosis not present

## 2023-05-13 DIAGNOSIS — K5909 Other constipation: Secondary | ICD-10-CM | POA: Diagnosis not present

## 2023-05-13 DIAGNOSIS — Z9181 History of falling: Secondary | ICD-10-CM | POA: Diagnosis not present

## 2023-05-13 DIAGNOSIS — N189 Chronic kidney disease, unspecified: Secondary | ICD-10-CM | POA: Diagnosis not present

## 2023-05-13 DIAGNOSIS — J432 Centrilobular emphysema: Secondary | ICD-10-CM | POA: Diagnosis not present

## 2023-05-13 DIAGNOSIS — E1151 Type 2 diabetes mellitus with diabetic peripheral angiopathy without gangrene: Secondary | ICD-10-CM | POA: Diagnosis not present

## 2023-05-13 DIAGNOSIS — Z602 Problems related to living alone: Secondary | ICD-10-CM | POA: Diagnosis not present

## 2023-05-13 DIAGNOSIS — N179 Acute kidney failure, unspecified: Secondary | ICD-10-CM | POA: Diagnosis not present

## 2023-05-13 DIAGNOSIS — Z7982 Long term (current) use of aspirin: Secondary | ICD-10-CM | POA: Diagnosis not present

## 2023-05-13 DIAGNOSIS — E1143 Type 2 diabetes mellitus with diabetic autonomic (poly)neuropathy: Secondary | ICD-10-CM | POA: Diagnosis not present

## 2023-05-13 DIAGNOSIS — Z7984 Long term (current) use of oral hypoglycemic drugs: Secondary | ICD-10-CM | POA: Diagnosis not present

## 2023-05-13 DIAGNOSIS — I7090 Unspecified atherosclerosis: Secondary | ICD-10-CM | POA: Diagnosis not present

## 2023-05-13 DIAGNOSIS — S2242XD Multiple fractures of ribs, left side, subsequent encounter for fracture with routine healing: Secondary | ICD-10-CM | POA: Diagnosis not present

## 2023-05-13 DIAGNOSIS — F329 Major depressive disorder, single episode, unspecified: Secondary | ICD-10-CM | POA: Diagnosis not present

## 2023-05-13 DIAGNOSIS — E1122 Type 2 diabetes mellitus with diabetic chronic kidney disease: Secondary | ICD-10-CM | POA: Diagnosis not present

## 2023-05-13 DIAGNOSIS — Z85118 Personal history of other malignant neoplasm of bronchus and lung: Secondary | ICD-10-CM | POA: Diagnosis not present

## 2023-05-19 DIAGNOSIS — I350 Nonrheumatic aortic (valve) stenosis: Secondary | ICD-10-CM | POA: Diagnosis not present

## 2023-05-19 DIAGNOSIS — R600 Localized edema: Secondary | ICD-10-CM | POA: Diagnosis not present

## 2023-05-19 DIAGNOSIS — S2249XA Multiple fractures of ribs, unspecified side, initial encounter for closed fracture: Secondary | ICD-10-CM | POA: Diagnosis not present

## 2023-05-19 DIAGNOSIS — E1169 Type 2 diabetes mellitus with other specified complication: Secondary | ICD-10-CM | POA: Diagnosis not present

## 2023-05-19 DIAGNOSIS — Z6830 Body mass index (BMI) 30.0-30.9, adult: Secondary | ICD-10-CM | POA: Diagnosis not present

## 2023-05-19 DIAGNOSIS — F17210 Nicotine dependence, cigarettes, uncomplicated: Secondary | ICD-10-CM | POA: Diagnosis not present

## 2023-05-19 DIAGNOSIS — R030 Elevated blood-pressure reading, without diagnosis of hypertension: Secondary | ICD-10-CM | POA: Diagnosis not present

## 2023-05-19 DIAGNOSIS — S3609XA Other injury of spleen, initial encounter: Secondary | ICD-10-CM | POA: Diagnosis not present

## 2023-05-22 DIAGNOSIS — N189 Chronic kidney disease, unspecified: Secondary | ICD-10-CM | POA: Diagnosis not present

## 2023-05-22 DIAGNOSIS — J432 Centrilobular emphysema: Secondary | ICD-10-CM | POA: Diagnosis not present

## 2023-05-22 DIAGNOSIS — E1151 Type 2 diabetes mellitus with diabetic peripheral angiopathy without gangrene: Secondary | ICD-10-CM | POA: Diagnosis not present

## 2023-05-22 DIAGNOSIS — Z7984 Long term (current) use of oral hypoglycemic drugs: Secondary | ICD-10-CM | POA: Diagnosis not present

## 2023-05-22 DIAGNOSIS — I7090 Unspecified atherosclerosis: Secondary | ICD-10-CM | POA: Diagnosis not present

## 2023-05-22 DIAGNOSIS — N179 Acute kidney failure, unspecified: Secondary | ICD-10-CM | POA: Diagnosis not present

## 2023-05-22 DIAGNOSIS — Z7982 Long term (current) use of aspirin: Secondary | ICD-10-CM | POA: Diagnosis not present

## 2023-05-22 DIAGNOSIS — E1122 Type 2 diabetes mellitus with diabetic chronic kidney disease: Secondary | ICD-10-CM | POA: Diagnosis not present

## 2023-05-22 DIAGNOSIS — E1143 Type 2 diabetes mellitus with diabetic autonomic (poly)neuropathy: Secondary | ICD-10-CM | POA: Diagnosis not present

## 2023-05-22 DIAGNOSIS — Z9181 History of falling: Secondary | ICD-10-CM | POA: Diagnosis not present

## 2023-05-22 DIAGNOSIS — K5909 Other constipation: Secondary | ICD-10-CM | POA: Diagnosis not present

## 2023-05-22 DIAGNOSIS — S2242XD Multiple fractures of ribs, left side, subsequent encounter for fracture with routine healing: Secondary | ICD-10-CM | POA: Diagnosis not present

## 2023-05-22 DIAGNOSIS — F329 Major depressive disorder, single episode, unspecified: Secondary | ICD-10-CM | POA: Diagnosis not present

## 2023-05-22 DIAGNOSIS — Z85118 Personal history of other malignant neoplasm of bronchus and lung: Secondary | ICD-10-CM | POA: Diagnosis not present

## 2023-05-22 DIAGNOSIS — Z602 Problems related to living alone: Secondary | ICD-10-CM | POA: Diagnosis not present

## 2023-05-23 DIAGNOSIS — N189 Chronic kidney disease, unspecified: Secondary | ICD-10-CM | POA: Diagnosis not present

## 2023-05-23 DIAGNOSIS — J432 Centrilobular emphysema: Secondary | ICD-10-CM | POA: Diagnosis not present

## 2023-05-23 DIAGNOSIS — S2242XD Multiple fractures of ribs, left side, subsequent encounter for fracture with routine healing: Secondary | ICD-10-CM | POA: Diagnosis not present

## 2023-05-23 DIAGNOSIS — N179 Acute kidney failure, unspecified: Secondary | ICD-10-CM | POA: Diagnosis not present

## 2023-05-23 DIAGNOSIS — Z7982 Long term (current) use of aspirin: Secondary | ICD-10-CM | POA: Diagnosis not present

## 2023-05-23 DIAGNOSIS — I7090 Unspecified atherosclerosis: Secondary | ICD-10-CM | POA: Diagnosis not present

## 2023-05-23 DIAGNOSIS — Z9181 History of falling: Secondary | ICD-10-CM | POA: Diagnosis not present

## 2023-05-23 DIAGNOSIS — K5909 Other constipation: Secondary | ICD-10-CM | POA: Diagnosis not present

## 2023-05-23 DIAGNOSIS — Z85118 Personal history of other malignant neoplasm of bronchus and lung: Secondary | ICD-10-CM | POA: Diagnosis not present

## 2023-05-23 DIAGNOSIS — E1143 Type 2 diabetes mellitus with diabetic autonomic (poly)neuropathy: Secondary | ICD-10-CM | POA: Diagnosis not present

## 2023-05-23 DIAGNOSIS — E1151 Type 2 diabetes mellitus with diabetic peripheral angiopathy without gangrene: Secondary | ICD-10-CM | POA: Diagnosis not present

## 2023-05-23 DIAGNOSIS — Z7984 Long term (current) use of oral hypoglycemic drugs: Secondary | ICD-10-CM | POA: Diagnosis not present

## 2023-05-23 DIAGNOSIS — Z602 Problems related to living alone: Secondary | ICD-10-CM | POA: Diagnosis not present

## 2023-05-23 DIAGNOSIS — E1122 Type 2 diabetes mellitus with diabetic chronic kidney disease: Secondary | ICD-10-CM | POA: Diagnosis not present

## 2023-05-23 DIAGNOSIS — F329 Major depressive disorder, single episode, unspecified: Secondary | ICD-10-CM | POA: Diagnosis not present

## 2023-05-25 DIAGNOSIS — T8484XD Pain due to internal orthopedic prosthetic devices, implants and grafts, subsequent encounter: Secondary | ICD-10-CM | POA: Diagnosis not present

## 2023-05-25 DIAGNOSIS — M1711 Unilateral primary osteoarthritis, right knee: Secondary | ICD-10-CM | POA: Diagnosis not present

## 2023-05-25 DIAGNOSIS — M25461 Effusion, right knee: Secondary | ICD-10-CM | POA: Diagnosis not present

## 2023-05-25 DIAGNOSIS — M899 Disorder of bone, unspecified: Secondary | ICD-10-CM | POA: Diagnosis not present

## 2023-05-25 DIAGNOSIS — M25562 Pain in left knee: Secondary | ICD-10-CM | POA: Diagnosis not present

## 2023-05-25 DIAGNOSIS — M25561 Pain in right knee: Secondary | ICD-10-CM | POA: Diagnosis not present

## 2023-05-25 DIAGNOSIS — W19XXXA Unspecified fall, initial encounter: Secondary | ICD-10-CM | POA: Diagnosis not present

## 2023-05-25 DIAGNOSIS — Z96652 Presence of left artificial knee joint: Secondary | ICD-10-CM | POA: Diagnosis not present

## 2023-05-26 DIAGNOSIS — E1151 Type 2 diabetes mellitus with diabetic peripheral angiopathy without gangrene: Secondary | ICD-10-CM | POA: Diagnosis not present

## 2023-05-26 DIAGNOSIS — N189 Chronic kidney disease, unspecified: Secondary | ICD-10-CM | POA: Diagnosis not present

## 2023-05-26 DIAGNOSIS — K5909 Other constipation: Secondary | ICD-10-CM | POA: Diagnosis not present

## 2023-05-26 DIAGNOSIS — N179 Acute kidney failure, unspecified: Secondary | ICD-10-CM | POA: Diagnosis not present

## 2023-05-26 DIAGNOSIS — E1143 Type 2 diabetes mellitus with diabetic autonomic (poly)neuropathy: Secondary | ICD-10-CM | POA: Diagnosis not present

## 2023-05-26 DIAGNOSIS — Z85118 Personal history of other malignant neoplasm of bronchus and lung: Secondary | ICD-10-CM | POA: Diagnosis not present

## 2023-05-26 DIAGNOSIS — J432 Centrilobular emphysema: Secondary | ICD-10-CM | POA: Diagnosis not present

## 2023-05-26 DIAGNOSIS — Z9181 History of falling: Secondary | ICD-10-CM | POA: Diagnosis not present

## 2023-05-26 DIAGNOSIS — Z7984 Long term (current) use of oral hypoglycemic drugs: Secondary | ICD-10-CM | POA: Diagnosis not present

## 2023-05-26 DIAGNOSIS — Z602 Problems related to living alone: Secondary | ICD-10-CM | POA: Diagnosis not present

## 2023-05-26 DIAGNOSIS — S2242XD Multiple fractures of ribs, left side, subsequent encounter for fracture with routine healing: Secondary | ICD-10-CM | POA: Diagnosis not present

## 2023-05-26 DIAGNOSIS — E1122 Type 2 diabetes mellitus with diabetic chronic kidney disease: Secondary | ICD-10-CM | POA: Diagnosis not present

## 2023-05-26 DIAGNOSIS — F329 Major depressive disorder, single episode, unspecified: Secondary | ICD-10-CM | POA: Diagnosis not present

## 2023-05-26 DIAGNOSIS — I7090 Unspecified atherosclerosis: Secondary | ICD-10-CM | POA: Diagnosis not present

## 2023-05-26 DIAGNOSIS — Z7982 Long term (current) use of aspirin: Secondary | ICD-10-CM | POA: Diagnosis not present

## 2023-05-28 DIAGNOSIS — J430 Unilateral pulmonary emphysema [MacLeod's syndrome]: Secondary | ICD-10-CM | POA: Diagnosis not present

## 2023-05-28 DIAGNOSIS — J9601 Acute respiratory failure with hypoxia: Secondary | ICD-10-CM | POA: Diagnosis not present

## 2023-05-28 DIAGNOSIS — I129 Hypertensive chronic kidney disease with stage 1 through stage 4 chronic kidney disease, or unspecified chronic kidney disease: Secondary | ICD-10-CM | POA: Diagnosis not present

## 2023-05-28 DIAGNOSIS — J449 Chronic obstructive pulmonary disease, unspecified: Secondary | ICD-10-CM | POA: Diagnosis not present

## 2023-05-29 DIAGNOSIS — Z9181 History of falling: Secondary | ICD-10-CM | POA: Diagnosis not present

## 2023-05-29 DIAGNOSIS — N189 Chronic kidney disease, unspecified: Secondary | ICD-10-CM | POA: Diagnosis not present

## 2023-05-29 DIAGNOSIS — F329 Major depressive disorder, single episode, unspecified: Secondary | ICD-10-CM | POA: Diagnosis not present

## 2023-05-29 DIAGNOSIS — Z85118 Personal history of other malignant neoplasm of bronchus and lung: Secondary | ICD-10-CM | POA: Diagnosis not present

## 2023-05-29 DIAGNOSIS — Z602 Problems related to living alone: Secondary | ICD-10-CM | POA: Diagnosis not present

## 2023-05-29 DIAGNOSIS — J432 Centrilobular emphysema: Secondary | ICD-10-CM | POA: Diagnosis not present

## 2023-05-29 DIAGNOSIS — Z7982 Long term (current) use of aspirin: Secondary | ICD-10-CM | POA: Diagnosis not present

## 2023-05-29 DIAGNOSIS — S2242XD Multiple fractures of ribs, left side, subsequent encounter for fracture with routine healing: Secondary | ICD-10-CM | POA: Diagnosis not present

## 2023-05-29 DIAGNOSIS — Z7984 Long term (current) use of oral hypoglycemic drugs: Secondary | ICD-10-CM | POA: Diagnosis not present

## 2023-05-29 DIAGNOSIS — N179 Acute kidney failure, unspecified: Secondary | ICD-10-CM | POA: Diagnosis not present

## 2023-05-29 DIAGNOSIS — E1143 Type 2 diabetes mellitus with diabetic autonomic (poly)neuropathy: Secondary | ICD-10-CM | POA: Diagnosis not present

## 2023-05-29 DIAGNOSIS — I7090 Unspecified atherosclerosis: Secondary | ICD-10-CM | POA: Diagnosis not present

## 2023-05-29 DIAGNOSIS — K5909 Other constipation: Secondary | ICD-10-CM | POA: Diagnosis not present

## 2023-05-29 DIAGNOSIS — E1122 Type 2 diabetes mellitus with diabetic chronic kidney disease: Secondary | ICD-10-CM | POA: Diagnosis not present

## 2023-05-29 DIAGNOSIS — E1151 Type 2 diabetes mellitus with diabetic peripheral angiopathy without gangrene: Secondary | ICD-10-CM | POA: Diagnosis not present

## 2023-05-31 DIAGNOSIS — K5909 Other constipation: Secondary | ICD-10-CM | POA: Diagnosis not present

## 2023-05-31 DIAGNOSIS — Z85118 Personal history of other malignant neoplasm of bronchus and lung: Secondary | ICD-10-CM | POA: Diagnosis not present

## 2023-05-31 DIAGNOSIS — E1143 Type 2 diabetes mellitus with diabetic autonomic (poly)neuropathy: Secondary | ICD-10-CM | POA: Diagnosis not present

## 2023-05-31 DIAGNOSIS — N189 Chronic kidney disease, unspecified: Secondary | ICD-10-CM | POA: Diagnosis not present

## 2023-05-31 DIAGNOSIS — N179 Acute kidney failure, unspecified: Secondary | ICD-10-CM | POA: Diagnosis not present

## 2023-05-31 DIAGNOSIS — E1151 Type 2 diabetes mellitus with diabetic peripheral angiopathy without gangrene: Secondary | ICD-10-CM | POA: Diagnosis not present

## 2023-05-31 DIAGNOSIS — J432 Centrilobular emphysema: Secondary | ICD-10-CM | POA: Diagnosis not present

## 2023-05-31 DIAGNOSIS — F329 Major depressive disorder, single episode, unspecified: Secondary | ICD-10-CM | POA: Diagnosis not present

## 2023-05-31 DIAGNOSIS — I7090 Unspecified atherosclerosis: Secondary | ICD-10-CM | POA: Diagnosis not present

## 2023-05-31 DIAGNOSIS — Z7982 Long term (current) use of aspirin: Secondary | ICD-10-CM | POA: Diagnosis not present

## 2023-05-31 DIAGNOSIS — S2242XD Multiple fractures of ribs, left side, subsequent encounter for fracture with routine healing: Secondary | ICD-10-CM | POA: Diagnosis not present

## 2023-05-31 DIAGNOSIS — Z602 Problems related to living alone: Secondary | ICD-10-CM | POA: Diagnosis not present

## 2023-05-31 DIAGNOSIS — Z9181 History of falling: Secondary | ICD-10-CM | POA: Diagnosis not present

## 2023-05-31 DIAGNOSIS — Z7984 Long term (current) use of oral hypoglycemic drugs: Secondary | ICD-10-CM | POA: Diagnosis not present

## 2023-05-31 DIAGNOSIS — E1122 Type 2 diabetes mellitus with diabetic chronic kidney disease: Secondary | ICD-10-CM | POA: Diagnosis not present

## 2023-06-01 DIAGNOSIS — K5909 Other constipation: Secondary | ICD-10-CM | POA: Diagnosis not present

## 2023-06-01 DIAGNOSIS — Z9181 History of falling: Secondary | ICD-10-CM | POA: Diagnosis not present

## 2023-06-01 DIAGNOSIS — Z85118 Personal history of other malignant neoplasm of bronchus and lung: Secondary | ICD-10-CM | POA: Diagnosis not present

## 2023-06-01 DIAGNOSIS — F329 Major depressive disorder, single episode, unspecified: Secondary | ICD-10-CM | POA: Diagnosis not present

## 2023-06-01 DIAGNOSIS — N179 Acute kidney failure, unspecified: Secondary | ICD-10-CM | POA: Diagnosis not present

## 2023-06-01 DIAGNOSIS — Z7982 Long term (current) use of aspirin: Secondary | ICD-10-CM | POA: Diagnosis not present

## 2023-06-01 DIAGNOSIS — S2242XD Multiple fractures of ribs, left side, subsequent encounter for fracture with routine healing: Secondary | ICD-10-CM | POA: Diagnosis not present

## 2023-06-01 DIAGNOSIS — Z7984 Long term (current) use of oral hypoglycemic drugs: Secondary | ICD-10-CM | POA: Diagnosis not present

## 2023-06-01 DIAGNOSIS — E1122 Type 2 diabetes mellitus with diabetic chronic kidney disease: Secondary | ICD-10-CM | POA: Diagnosis not present

## 2023-06-01 DIAGNOSIS — I7090 Unspecified atherosclerosis: Secondary | ICD-10-CM | POA: Diagnosis not present

## 2023-06-01 DIAGNOSIS — N189 Chronic kidney disease, unspecified: Secondary | ICD-10-CM | POA: Diagnosis not present

## 2023-06-01 DIAGNOSIS — E1151 Type 2 diabetes mellitus with diabetic peripheral angiopathy without gangrene: Secondary | ICD-10-CM | POA: Diagnosis not present

## 2023-06-01 DIAGNOSIS — E1143 Type 2 diabetes mellitus with diabetic autonomic (poly)neuropathy: Secondary | ICD-10-CM | POA: Diagnosis not present

## 2023-06-01 DIAGNOSIS — Z602 Problems related to living alone: Secondary | ICD-10-CM | POA: Diagnosis not present

## 2023-06-01 DIAGNOSIS — J432 Centrilobular emphysema: Secondary | ICD-10-CM | POA: Diagnosis not present

## 2023-06-05 DIAGNOSIS — I7090 Unspecified atherosclerosis: Secondary | ICD-10-CM | POA: Diagnosis not present

## 2023-06-05 DIAGNOSIS — Z7982 Long term (current) use of aspirin: Secondary | ICD-10-CM | POA: Diagnosis not present

## 2023-06-05 DIAGNOSIS — F329 Major depressive disorder, single episode, unspecified: Secondary | ICD-10-CM | POA: Diagnosis not present

## 2023-06-05 DIAGNOSIS — E1143 Type 2 diabetes mellitus with diabetic autonomic (poly)neuropathy: Secondary | ICD-10-CM | POA: Diagnosis not present

## 2023-06-05 DIAGNOSIS — Z9181 History of falling: Secondary | ICD-10-CM | POA: Diagnosis not present

## 2023-06-05 DIAGNOSIS — Z7984 Long term (current) use of oral hypoglycemic drugs: Secondary | ICD-10-CM | POA: Diagnosis not present

## 2023-06-05 DIAGNOSIS — Z85118 Personal history of other malignant neoplasm of bronchus and lung: Secondary | ICD-10-CM | POA: Diagnosis not present

## 2023-06-05 DIAGNOSIS — N179 Acute kidney failure, unspecified: Secondary | ICD-10-CM | POA: Diagnosis not present

## 2023-06-05 DIAGNOSIS — E1122 Type 2 diabetes mellitus with diabetic chronic kidney disease: Secondary | ICD-10-CM | POA: Diagnosis not present

## 2023-06-05 DIAGNOSIS — E1151 Type 2 diabetes mellitus with diabetic peripheral angiopathy without gangrene: Secondary | ICD-10-CM | POA: Diagnosis not present

## 2023-06-05 DIAGNOSIS — J432 Centrilobular emphysema: Secondary | ICD-10-CM | POA: Diagnosis not present

## 2023-06-05 DIAGNOSIS — K5909 Other constipation: Secondary | ICD-10-CM | POA: Diagnosis not present

## 2023-06-05 DIAGNOSIS — S2242XD Multiple fractures of ribs, left side, subsequent encounter for fracture with routine healing: Secondary | ICD-10-CM | POA: Diagnosis not present

## 2023-06-05 DIAGNOSIS — N189 Chronic kidney disease, unspecified: Secondary | ICD-10-CM | POA: Diagnosis not present

## 2023-06-05 DIAGNOSIS — Z602 Problems related to living alone: Secondary | ICD-10-CM | POA: Diagnosis not present

## 2023-06-08 DIAGNOSIS — N189 Chronic kidney disease, unspecified: Secondary | ICD-10-CM | POA: Diagnosis not present

## 2023-06-08 DIAGNOSIS — E1122 Type 2 diabetes mellitus with diabetic chronic kidney disease: Secondary | ICD-10-CM | POA: Diagnosis not present

## 2023-06-08 DIAGNOSIS — F329 Major depressive disorder, single episode, unspecified: Secondary | ICD-10-CM | POA: Diagnosis not present

## 2023-06-08 DIAGNOSIS — Z9181 History of falling: Secondary | ICD-10-CM | POA: Diagnosis not present

## 2023-06-08 DIAGNOSIS — Z85118 Personal history of other malignant neoplasm of bronchus and lung: Secondary | ICD-10-CM | POA: Diagnosis not present

## 2023-06-08 DIAGNOSIS — I7090 Unspecified atherosclerosis: Secondary | ICD-10-CM | POA: Diagnosis not present

## 2023-06-08 DIAGNOSIS — E1143 Type 2 diabetes mellitus with diabetic autonomic (poly)neuropathy: Secondary | ICD-10-CM | POA: Diagnosis not present

## 2023-06-08 DIAGNOSIS — S2242XD Multiple fractures of ribs, left side, subsequent encounter for fracture with routine healing: Secondary | ICD-10-CM | POA: Diagnosis not present

## 2023-06-08 DIAGNOSIS — Z7982 Long term (current) use of aspirin: Secondary | ICD-10-CM | POA: Diagnosis not present

## 2023-06-08 DIAGNOSIS — N179 Acute kidney failure, unspecified: Secondary | ICD-10-CM | POA: Diagnosis not present

## 2023-06-08 DIAGNOSIS — Z7984 Long term (current) use of oral hypoglycemic drugs: Secondary | ICD-10-CM | POA: Diagnosis not present

## 2023-06-08 DIAGNOSIS — J432 Centrilobular emphysema: Secondary | ICD-10-CM | POA: Diagnosis not present

## 2023-06-08 DIAGNOSIS — K5909 Other constipation: Secondary | ICD-10-CM | POA: Diagnosis not present

## 2023-06-08 DIAGNOSIS — Z602 Problems related to living alone: Secondary | ICD-10-CM | POA: Diagnosis not present

## 2023-06-08 DIAGNOSIS — E1151 Type 2 diabetes mellitus with diabetic peripheral angiopathy without gangrene: Secondary | ICD-10-CM | POA: Diagnosis not present

## 2023-06-09 DIAGNOSIS — D649 Anemia, unspecified: Secondary | ICD-10-CM | POA: Diagnosis not present

## 2023-06-09 DIAGNOSIS — N1832 Chronic kidney disease, stage 3b: Secondary | ICD-10-CM | POA: Diagnosis not present

## 2023-06-09 DIAGNOSIS — E1169 Type 2 diabetes mellitus with other specified complication: Secondary | ICD-10-CM | POA: Diagnosis not present

## 2023-06-12 DIAGNOSIS — F329 Major depressive disorder, single episode, unspecified: Secondary | ICD-10-CM | POA: Diagnosis not present

## 2023-06-12 DIAGNOSIS — E1122 Type 2 diabetes mellitus with diabetic chronic kidney disease: Secondary | ICD-10-CM | POA: Diagnosis not present

## 2023-06-12 DIAGNOSIS — Z9181 History of falling: Secondary | ICD-10-CM | POA: Diagnosis not present

## 2023-06-12 DIAGNOSIS — N189 Chronic kidney disease, unspecified: Secondary | ICD-10-CM | POA: Diagnosis not present

## 2023-06-12 DIAGNOSIS — K5909 Other constipation: Secondary | ICD-10-CM | POA: Diagnosis not present

## 2023-06-12 DIAGNOSIS — I7090 Unspecified atherosclerosis: Secondary | ICD-10-CM | POA: Diagnosis not present

## 2023-06-12 DIAGNOSIS — N179 Acute kidney failure, unspecified: Secondary | ICD-10-CM | POA: Diagnosis not present

## 2023-06-12 DIAGNOSIS — Z85118 Personal history of other malignant neoplasm of bronchus and lung: Secondary | ICD-10-CM | POA: Diagnosis not present

## 2023-06-12 DIAGNOSIS — Z7984 Long term (current) use of oral hypoglycemic drugs: Secondary | ICD-10-CM | POA: Diagnosis not present

## 2023-06-12 DIAGNOSIS — E1151 Type 2 diabetes mellitus with diabetic peripheral angiopathy without gangrene: Secondary | ICD-10-CM | POA: Diagnosis not present

## 2023-06-12 DIAGNOSIS — E1143 Type 2 diabetes mellitus with diabetic autonomic (poly)neuropathy: Secondary | ICD-10-CM | POA: Diagnosis not present

## 2023-06-12 DIAGNOSIS — J432 Centrilobular emphysema: Secondary | ICD-10-CM | POA: Diagnosis not present

## 2023-06-12 DIAGNOSIS — Z602 Problems related to living alone: Secondary | ICD-10-CM | POA: Diagnosis not present

## 2023-06-12 DIAGNOSIS — Z7982 Long term (current) use of aspirin: Secondary | ICD-10-CM | POA: Diagnosis not present

## 2023-06-12 DIAGNOSIS — S2242XD Multiple fractures of ribs, left side, subsequent encounter for fracture with routine healing: Secondary | ICD-10-CM | POA: Diagnosis not present

## 2023-06-13 ENCOUNTER — Encounter: Payer: Self-pay | Admitting: *Deleted

## 2023-06-15 DIAGNOSIS — K5909 Other constipation: Secondary | ICD-10-CM | POA: Diagnosis not present

## 2023-06-15 DIAGNOSIS — Z85118 Personal history of other malignant neoplasm of bronchus and lung: Secondary | ICD-10-CM | POA: Diagnosis not present

## 2023-06-15 DIAGNOSIS — Z7984 Long term (current) use of oral hypoglycemic drugs: Secondary | ICD-10-CM | POA: Diagnosis not present

## 2023-06-15 DIAGNOSIS — N179 Acute kidney failure, unspecified: Secondary | ICD-10-CM | POA: Diagnosis not present

## 2023-06-15 DIAGNOSIS — E1143 Type 2 diabetes mellitus with diabetic autonomic (poly)neuropathy: Secondary | ICD-10-CM | POA: Diagnosis not present

## 2023-06-15 DIAGNOSIS — S2242XD Multiple fractures of ribs, left side, subsequent encounter for fracture with routine healing: Secondary | ICD-10-CM | POA: Diagnosis not present

## 2023-06-15 DIAGNOSIS — Z7982 Long term (current) use of aspirin: Secondary | ICD-10-CM | POA: Diagnosis not present

## 2023-06-15 DIAGNOSIS — Z602 Problems related to living alone: Secondary | ICD-10-CM | POA: Diagnosis not present

## 2023-06-15 DIAGNOSIS — I7090 Unspecified atherosclerosis: Secondary | ICD-10-CM | POA: Diagnosis not present

## 2023-06-15 DIAGNOSIS — N189 Chronic kidney disease, unspecified: Secondary | ICD-10-CM | POA: Diagnosis not present

## 2023-06-15 DIAGNOSIS — E1122 Type 2 diabetes mellitus with diabetic chronic kidney disease: Secondary | ICD-10-CM | POA: Diagnosis not present

## 2023-06-15 DIAGNOSIS — Z9181 History of falling: Secondary | ICD-10-CM | POA: Diagnosis not present

## 2023-06-15 DIAGNOSIS — E1151 Type 2 diabetes mellitus with diabetic peripheral angiopathy without gangrene: Secondary | ICD-10-CM | POA: Diagnosis not present

## 2023-06-15 DIAGNOSIS — J432 Centrilobular emphysema: Secondary | ICD-10-CM | POA: Diagnosis not present

## 2023-06-15 DIAGNOSIS — F329 Major depressive disorder, single episode, unspecified: Secondary | ICD-10-CM | POA: Diagnosis not present

## 2023-06-16 DIAGNOSIS — E7801 Familial hypercholesterolemia: Secondary | ICD-10-CM | POA: Diagnosis not present

## 2023-06-16 DIAGNOSIS — K5900 Constipation, unspecified: Secondary | ICD-10-CM | POA: Diagnosis not present

## 2023-06-16 DIAGNOSIS — R918 Other nonspecific abnormal finding of lung field: Secondary | ICD-10-CM | POA: Diagnosis not present

## 2023-06-16 DIAGNOSIS — F17200 Nicotine dependence, unspecified, uncomplicated: Secondary | ICD-10-CM | POA: Diagnosis not present

## 2023-06-16 DIAGNOSIS — N1832 Chronic kidney disease, stage 3b: Secondary | ICD-10-CM | POA: Diagnosis not present

## 2023-06-16 DIAGNOSIS — E1169 Type 2 diabetes mellitus with other specified complication: Secondary | ICD-10-CM | POA: Diagnosis not present

## 2023-06-16 DIAGNOSIS — I712 Thoracic aortic aneurysm, without rupture, unspecified: Secondary | ICD-10-CM | POA: Diagnosis not present

## 2023-06-16 DIAGNOSIS — M79606 Pain in leg, unspecified: Secondary | ICD-10-CM | POA: Diagnosis not present

## 2023-06-16 DIAGNOSIS — C3490 Malignant neoplasm of unspecified part of unspecified bronchus or lung: Secondary | ICD-10-CM | POA: Diagnosis not present

## 2023-06-16 DIAGNOSIS — I38 Endocarditis, valve unspecified: Secondary | ICD-10-CM | POA: Diagnosis not present

## 2023-06-16 DIAGNOSIS — M25562 Pain in left knee: Secondary | ICD-10-CM | POA: Diagnosis not present

## 2023-06-16 DIAGNOSIS — K219 Gastro-esophageal reflux disease without esophagitis: Secondary | ICD-10-CM | POA: Diagnosis not present

## 2023-06-19 DIAGNOSIS — Z85118 Personal history of other malignant neoplasm of bronchus and lung: Secondary | ICD-10-CM | POA: Diagnosis not present

## 2023-06-19 DIAGNOSIS — N189 Chronic kidney disease, unspecified: Secondary | ICD-10-CM | POA: Diagnosis not present

## 2023-06-19 DIAGNOSIS — N179 Acute kidney failure, unspecified: Secondary | ICD-10-CM | POA: Diagnosis not present

## 2023-06-19 DIAGNOSIS — Z7984 Long term (current) use of oral hypoglycemic drugs: Secondary | ICD-10-CM | POA: Diagnosis not present

## 2023-06-19 DIAGNOSIS — E1143 Type 2 diabetes mellitus with diabetic autonomic (poly)neuropathy: Secondary | ICD-10-CM | POA: Diagnosis not present

## 2023-06-19 DIAGNOSIS — J432 Centrilobular emphysema: Secondary | ICD-10-CM | POA: Diagnosis not present

## 2023-06-19 DIAGNOSIS — E1151 Type 2 diabetes mellitus with diabetic peripheral angiopathy without gangrene: Secondary | ICD-10-CM | POA: Diagnosis not present

## 2023-06-19 DIAGNOSIS — F329 Major depressive disorder, single episode, unspecified: Secondary | ICD-10-CM | POA: Diagnosis not present

## 2023-06-19 DIAGNOSIS — Z602 Problems related to living alone: Secondary | ICD-10-CM | POA: Diagnosis not present

## 2023-06-19 DIAGNOSIS — K5909 Other constipation: Secondary | ICD-10-CM | POA: Diagnosis not present

## 2023-06-19 DIAGNOSIS — E1122 Type 2 diabetes mellitus with diabetic chronic kidney disease: Secondary | ICD-10-CM | POA: Diagnosis not present

## 2023-06-19 DIAGNOSIS — Z9181 History of falling: Secondary | ICD-10-CM | POA: Diagnosis not present

## 2023-06-19 DIAGNOSIS — I7090 Unspecified atherosclerosis: Secondary | ICD-10-CM | POA: Diagnosis not present

## 2023-06-19 DIAGNOSIS — S2242XD Multiple fractures of ribs, left side, subsequent encounter for fracture with routine healing: Secondary | ICD-10-CM | POA: Diagnosis not present

## 2023-06-19 DIAGNOSIS — Z7982 Long term (current) use of aspirin: Secondary | ICD-10-CM | POA: Diagnosis not present

## 2023-06-22 DIAGNOSIS — F329 Major depressive disorder, single episode, unspecified: Secondary | ICD-10-CM | POA: Diagnosis not present

## 2023-06-22 DIAGNOSIS — N179 Acute kidney failure, unspecified: Secondary | ICD-10-CM | POA: Diagnosis not present

## 2023-06-22 DIAGNOSIS — E1143 Type 2 diabetes mellitus with diabetic autonomic (poly)neuropathy: Secondary | ICD-10-CM | POA: Diagnosis not present

## 2023-06-22 DIAGNOSIS — Z85118 Personal history of other malignant neoplasm of bronchus and lung: Secondary | ICD-10-CM | POA: Diagnosis not present

## 2023-06-22 DIAGNOSIS — N189 Chronic kidney disease, unspecified: Secondary | ICD-10-CM | POA: Diagnosis not present

## 2023-06-22 DIAGNOSIS — Z7984 Long term (current) use of oral hypoglycemic drugs: Secondary | ICD-10-CM | POA: Diagnosis not present

## 2023-06-22 DIAGNOSIS — K5909 Other constipation: Secondary | ICD-10-CM | POA: Diagnosis not present

## 2023-06-22 DIAGNOSIS — E1122 Type 2 diabetes mellitus with diabetic chronic kidney disease: Secondary | ICD-10-CM | POA: Diagnosis not present

## 2023-06-22 DIAGNOSIS — J432 Centrilobular emphysema: Secondary | ICD-10-CM | POA: Diagnosis not present

## 2023-06-22 DIAGNOSIS — I7090 Unspecified atherosclerosis: Secondary | ICD-10-CM | POA: Diagnosis not present

## 2023-06-22 DIAGNOSIS — Z9181 History of falling: Secondary | ICD-10-CM | POA: Diagnosis not present

## 2023-06-22 DIAGNOSIS — E1151 Type 2 diabetes mellitus with diabetic peripheral angiopathy without gangrene: Secondary | ICD-10-CM | POA: Diagnosis not present

## 2023-06-22 DIAGNOSIS — Z7982 Long term (current) use of aspirin: Secondary | ICD-10-CM | POA: Diagnosis not present

## 2023-06-22 DIAGNOSIS — S2242XD Multiple fractures of ribs, left side, subsequent encounter for fracture with routine healing: Secondary | ICD-10-CM | POA: Diagnosis not present

## 2023-06-22 DIAGNOSIS — Z602 Problems related to living alone: Secondary | ICD-10-CM | POA: Diagnosis not present

## 2023-06-26 DIAGNOSIS — S2242XD Multiple fractures of ribs, left side, subsequent encounter for fracture with routine healing: Secondary | ICD-10-CM | POA: Diagnosis not present

## 2023-06-26 DIAGNOSIS — N189 Chronic kidney disease, unspecified: Secondary | ICD-10-CM | POA: Diagnosis not present

## 2023-06-26 DIAGNOSIS — K5909 Other constipation: Secondary | ICD-10-CM | POA: Diagnosis not present

## 2023-06-26 DIAGNOSIS — Z9181 History of falling: Secondary | ICD-10-CM | POA: Diagnosis not present

## 2023-06-26 DIAGNOSIS — Z7982 Long term (current) use of aspirin: Secondary | ICD-10-CM | POA: Diagnosis not present

## 2023-06-26 DIAGNOSIS — J432 Centrilobular emphysema: Secondary | ICD-10-CM | POA: Diagnosis not present

## 2023-06-26 DIAGNOSIS — E1151 Type 2 diabetes mellitus with diabetic peripheral angiopathy without gangrene: Secondary | ICD-10-CM | POA: Diagnosis not present

## 2023-06-26 DIAGNOSIS — E1122 Type 2 diabetes mellitus with diabetic chronic kidney disease: Secondary | ICD-10-CM | POA: Diagnosis not present

## 2023-06-26 DIAGNOSIS — F329 Major depressive disorder, single episode, unspecified: Secondary | ICD-10-CM | POA: Diagnosis not present

## 2023-06-26 DIAGNOSIS — Z602 Problems related to living alone: Secondary | ICD-10-CM | POA: Diagnosis not present

## 2023-06-26 DIAGNOSIS — N179 Acute kidney failure, unspecified: Secondary | ICD-10-CM | POA: Diagnosis not present

## 2023-06-26 DIAGNOSIS — Z85118 Personal history of other malignant neoplasm of bronchus and lung: Secondary | ICD-10-CM | POA: Diagnosis not present

## 2023-06-26 DIAGNOSIS — E1143 Type 2 diabetes mellitus with diabetic autonomic (poly)neuropathy: Secondary | ICD-10-CM | POA: Diagnosis not present

## 2023-06-26 DIAGNOSIS — I7090 Unspecified atherosclerosis: Secondary | ICD-10-CM | POA: Diagnosis not present

## 2023-06-26 DIAGNOSIS — Z7984 Long term (current) use of oral hypoglycemic drugs: Secondary | ICD-10-CM | POA: Diagnosis not present

## 2023-06-28 DIAGNOSIS — I129 Hypertensive chronic kidney disease with stage 1 through stage 4 chronic kidney disease, or unspecified chronic kidney disease: Secondary | ICD-10-CM | POA: Diagnosis not present

## 2023-06-28 DIAGNOSIS — J430 Unilateral pulmonary emphysema [MacLeod's syndrome]: Secondary | ICD-10-CM | POA: Diagnosis not present

## 2023-06-28 DIAGNOSIS — J9601 Acute respiratory failure with hypoxia: Secondary | ICD-10-CM | POA: Diagnosis not present

## 2023-06-28 DIAGNOSIS — J449 Chronic obstructive pulmonary disease, unspecified: Secondary | ICD-10-CM | POA: Diagnosis not present

## 2023-07-19 DIAGNOSIS — M6289 Other specified disorders of muscle: Secondary | ICD-10-CM | POA: Diagnosis not present

## 2023-07-19 DIAGNOSIS — M898X7 Other specified disorders of bone, ankle and foot: Secondary | ICD-10-CM | POA: Diagnosis not present

## 2023-07-19 DIAGNOSIS — M778 Other enthesopathies, not elsewhere classified: Secondary | ICD-10-CM | POA: Diagnosis not present

## 2023-07-19 DIAGNOSIS — M24172 Other articular cartilage disorders, left ankle: Secondary | ICD-10-CM | POA: Diagnosis not present

## 2023-07-19 DIAGNOSIS — M948X7 Other specified disorders of cartilage, ankle and foot: Secondary | ICD-10-CM | POA: Diagnosis not present

## 2023-07-19 DIAGNOSIS — M25572 Pain in left ankle and joints of left foot: Secondary | ICD-10-CM | POA: Diagnosis not present

## 2023-07-19 DIAGNOSIS — R600 Localized edema: Secondary | ICD-10-CM | POA: Diagnosis not present

## 2023-07-19 DIAGNOSIS — R609 Edema, unspecified: Secondary | ICD-10-CM | POA: Diagnosis not present

## 2023-07-19 DIAGNOSIS — M7732 Calcaneal spur, left foot: Secondary | ICD-10-CM | POA: Diagnosis not present

## 2023-07-19 DIAGNOSIS — S82892A Other fracture of left lower leg, initial encounter for closed fracture: Secondary | ICD-10-CM | POA: Diagnosis not present

## 2023-07-19 DIAGNOSIS — M25472 Effusion, left ankle: Secondary | ICD-10-CM | POA: Diagnosis not present

## 2023-07-19 DIAGNOSIS — M19072 Primary osteoarthritis, left ankle and foot: Secondary | ICD-10-CM | POA: Diagnosis not present

## 2023-07-29 DIAGNOSIS — J9601 Acute respiratory failure with hypoxia: Secondary | ICD-10-CM | POA: Diagnosis not present

## 2023-07-29 DIAGNOSIS — J430 Unilateral pulmonary emphysema [MacLeod's syndrome]: Secondary | ICD-10-CM | POA: Diagnosis not present

## 2023-07-29 DIAGNOSIS — I129 Hypertensive chronic kidney disease with stage 1 through stage 4 chronic kidney disease, or unspecified chronic kidney disease: Secondary | ICD-10-CM | POA: Diagnosis not present

## 2023-07-29 DIAGNOSIS — J449 Chronic obstructive pulmonary disease, unspecified: Secondary | ICD-10-CM | POA: Diagnosis not present

## 2023-07-31 DIAGNOSIS — N189 Chronic kidney disease, unspecified: Secondary | ICD-10-CM | POA: Diagnosis not present

## 2023-07-31 DIAGNOSIS — J181 Lobar pneumonia, unspecified organism: Secondary | ICD-10-CM | POA: Diagnosis not present

## 2023-07-31 DIAGNOSIS — Z9181 History of falling: Secondary | ICD-10-CM | POA: Diagnosis not present

## 2023-08-02 DIAGNOSIS — M1711 Unilateral primary osteoarthritis, right knee: Secondary | ICD-10-CM | POA: Diagnosis not present

## 2023-08-02 DIAGNOSIS — M25472 Effusion, left ankle: Secondary | ICD-10-CM | POA: Diagnosis not present

## 2023-08-02 DIAGNOSIS — M25572 Pain in left ankle and joints of left foot: Secondary | ICD-10-CM | POA: Diagnosis not present

## 2023-08-26 DIAGNOSIS — J449 Chronic obstructive pulmonary disease, unspecified: Secondary | ICD-10-CM | POA: Diagnosis not present

## 2023-08-26 DIAGNOSIS — J430 Unilateral pulmonary emphysema [MacLeod's syndrome]: Secondary | ICD-10-CM | POA: Diagnosis not present

## 2023-08-26 DIAGNOSIS — J9601 Acute respiratory failure with hypoxia: Secondary | ICD-10-CM | POA: Diagnosis not present

## 2023-08-26 DIAGNOSIS — I129 Hypertensive chronic kidney disease with stage 1 through stage 4 chronic kidney disease, or unspecified chronic kidney disease: Secondary | ICD-10-CM | POA: Diagnosis not present

## 2023-09-04 DIAGNOSIS — N184 Chronic kidney disease, stage 4 (severe): Secondary | ICD-10-CM | POA: Diagnosis not present

## 2023-09-11 DIAGNOSIS — N184 Chronic kidney disease, stage 4 (severe): Secondary | ICD-10-CM | POA: Diagnosis not present

## 2023-09-11 DIAGNOSIS — D649 Anemia, unspecified: Secondary | ICD-10-CM | POA: Diagnosis not present

## 2023-09-11 DIAGNOSIS — E119 Type 2 diabetes mellitus without complications: Secondary | ICD-10-CM | POA: Diagnosis not present

## 2023-09-11 DIAGNOSIS — I10 Essential (primary) hypertension: Secondary | ICD-10-CM | POA: Diagnosis not present

## 2023-09-21 ENCOUNTER — Ambulatory Visit: Payer: Medicare HMO | Attending: Nurse Practitioner | Admitting: Nurse Practitioner

## 2023-09-21 NOTE — Progress Notes (Deleted)
 Cardiology Office Note:  .   Date:  03/22/2023 ID:  Tristan Brooks, DOB May 24, 1941, MRN [id number] PCP: [facility]  [facility] Providers Cardiologist:  Dina Rich, MD    History of Present Illness: .   Tristan Brooks is a 83 y.o. male with a PMH of aortic valve stenosis, hyperlipidemia, lung mass, COPD, ascending aortic aneurysm, CKD, who presents today for 81-month follow-up appointment.  TTE in August 2023 showed EF 55 to 60%, moderate aortic valve stenosis with mean gradient 24 mmHg.  CT scan in 2023 at [facility] showed dense, masslike subpleural consolidation of peripheral right upper lobe, measuring 10.7 x 2.6 cm.  Was highly concerning for primary lung malignancy and high risk setting of emphysema.  Followed by [facility] in [place].  Underwent bronchoscopy in December 2023, was told it was not cancer and likely infectious process.  CT scan of chest in December 2023 showed mild aneurysm at 4.2 cm of ascending aorta.  Last seen by [facility] on July 22, 2022.  He was doing well at the time.   Today he presents for 85-month follow-up visit.  He states he is doing well. Denies any chest pain, shortness of breath, palpitations, syncope, presyncope, dizziness, orthopnea, PND, swelling or significant weight changes, acute bleeding, or claudication. Admits to arthritis, but denies any pain. Scheduled for CT scan of his chest later this month.   ROS: Negative. See HPI.   Studies Reviewed: .    Echo 01/2022:  1. Left ventricular ejection fraction, by estimation, is 55%. The left  ventricle has normal function. The left ventricle demonstrates regional wall motion abnormalities (see scoring diagram/findings for description). There is mild asymmetric left ventricular hypertrophy of the septal segment. Left ventricular diastolic parameters are consistent with Grade I diastolic dysfunction (impaired relaxation). The average left ventricular global longitudinal  strain is -13.2 %. The global longitudinal strain is abnormal.   2. RA-RV gradient 8 mmHg suggesting normal RVSP. Right ventricular systolic function is normal. The right ventricular size is normal.   3. The mitral valve is degenerative. Trivial mitral valve regurgitation.  Moderate mitral annular calcification.   4. The aortic valve is bicuspid. There is moderate calcification of the  aortic valve. Aortic valve regurgitation is moderate. Moderate to severe aortic valve stenosis. Aortic valve area, by VTI measures 1.11 cm. Aortic valve mean gradient measures 24.0 mmHg. Dimentionless index 0.44.   5. Aortic dilatation noted. There is mild dilatation of the aortic root,  measuring 42 mm. There is mild dilatation of the ascending aorta,  measuring 39 mm.   6. Unable to estimate CVP.  Physical Exam:   VS:  There were no vitals taken for this visit.   Wt Readings from Last 3 Encounters:  03/22/23 230 lb 9.6 oz (104.6 kg)  08/07/22 218 lb 12.8 oz (99.2 kg)  07/22/22 218 lb 12.8 oz (99.2 kg)    GEN: Well nourished, well developed in no acute distress NECK: No JVD; No carotid bruits CARDIAC: S1/S2, RRR, Grade 2/6 murmur, no rubs, no gallops RESPIRATORY:  Clear to auscultation without rales, wheezing or rhonchi  EXTREMITIES:  No edema; deformity along bilateral hands from past injury at work, signs of osteoarthritis to bilateral hands/knuckles  ASSESSMENT AND PLAN: .    Aortic valve stenosis due to bicuspid aortic valve TTE 01/2022 revealed bicuspid aortic valve with moderate to severe aortic valve stenosis, mean gradient measuring 24.0 mmHg. Patient remains asymptomatic. Will update Echocardiogram at this time for  further evaluation. No medication changes at this time. Encouraged him to stay well hydrated. Care and ED precautions discussed. Heart healthy diet encouraged.   HLD No recent labs on file. Will request most recent labs from PCP's office. Continue atorvastatin and Zetia. Heart healthy  diet encouraged.   3. HTN BP borderline elevated, denies BP issues at home. Discussed to monitor BP at home at least 2 hours after medications and sitting for 5-10 minutes. Will continue to monitor. No medication changes at this time. Heart healthy diet encouraged.   4. Lung Mass CT scan in 2023 at [facility] showed dense, masslike subpleural consolidation of peripheral right upper lobe, measuring 10.7 x 2.6 cm.  Was highly concerning for primary lung malignancy and high risk setting of emphysema.  Followed by [facility] in [place]. -- Underwent bronchoscopy on 06/15/2022 which appears to have shown candida, no malignancy noted.  - CT scan of chest 12/27/2022 revealed Slight improvement in large area of peripheral right middle lobe masslike consolidation. Background emphysema with stable additional small pulmonary nodules.  - Follows Heme/Onc and undergoing serial CT scans. 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 However, the mass appears relatively stable on serial imaging. Given the seemingly indolent behavior of his disease process and relative lack of symptoms, and after discussion with Dr. Ross Marcus he has elected to proceed with surveillance only for the time being." Next CT scan expected later this month. Continue to follow-up with Pulmonology and Heme/Onc.  5. Ascending aortic aneurysm CT scan 09/2022 revealed unchanged dilatation of ascending aorta around 4.2 cm. Will continue to monitor through serial CT scans that are being arranged through [facility], following Pulmonology and Heme/Onc - see above.   Dispo: Care and ED precautions discussed. Follow-up in 6 months with [facility] or APP.   Signed, Sharlene Dory, NP

## 2023-09-26 DIAGNOSIS — I129 Hypertensive chronic kidney disease with stage 1 through stage 4 chronic kidney disease, or unspecified chronic kidney disease: Secondary | ICD-10-CM | POA: Diagnosis not present

## 2023-09-26 DIAGNOSIS — J9601 Acute respiratory failure with hypoxia: Secondary | ICD-10-CM | POA: Diagnosis not present

## 2023-09-26 DIAGNOSIS — J449 Chronic obstructive pulmonary disease, unspecified: Secondary | ICD-10-CM | POA: Diagnosis not present

## 2023-09-26 DIAGNOSIS — J430 Unilateral pulmonary emphysema [MacLeod's syndrome]: Secondary | ICD-10-CM | POA: Diagnosis not present

## 2023-10-03 DIAGNOSIS — Z7984 Long term (current) use of oral hypoglycemic drugs: Secondary | ICD-10-CM | POA: Diagnosis not present

## 2023-10-03 DIAGNOSIS — E7800 Pure hypercholesterolemia, unspecified: Secondary | ICD-10-CM | POA: Diagnosis not present

## 2023-10-03 DIAGNOSIS — F17210 Nicotine dependence, cigarettes, uncomplicated: Secondary | ICD-10-CM | POA: Diagnosis not present

## 2023-10-03 DIAGNOSIS — J984 Other disorders of lung: Secondary | ICD-10-CM | POA: Diagnosis not present

## 2023-10-03 DIAGNOSIS — K219 Gastro-esophageal reflux disease without esophagitis: Secondary | ICD-10-CM | POA: Diagnosis not present

## 2023-10-03 DIAGNOSIS — N189 Chronic kidney disease, unspecified: Secondary | ICD-10-CM | POA: Diagnosis not present

## 2023-10-03 DIAGNOSIS — E1165 Type 2 diabetes mellitus with hyperglycemia: Secondary | ICD-10-CM | POA: Diagnosis not present

## 2023-10-03 DIAGNOSIS — E1122 Type 2 diabetes mellitus with diabetic chronic kidney disease: Secondary | ICD-10-CM | POA: Diagnosis not present

## 2023-10-03 DIAGNOSIS — R1030 Lower abdominal pain, unspecified: Secondary | ICD-10-CM | POA: Diagnosis not present

## 2023-10-03 DIAGNOSIS — R918 Other nonspecific abnormal finding of lung field: Secondary | ICD-10-CM | POA: Diagnosis not present

## 2023-10-03 DIAGNOSIS — K5900 Constipation, unspecified: Secondary | ICD-10-CM | POA: Diagnosis not present

## 2023-10-03 DIAGNOSIS — Z79899 Other long term (current) drug therapy: Secondary | ICD-10-CM | POA: Diagnosis not present

## 2023-10-03 DIAGNOSIS — J181 Lobar pneumonia, unspecified organism: Secondary | ICD-10-CM | POA: Diagnosis not present

## 2023-10-03 DIAGNOSIS — E041 Nontoxic single thyroid nodule: Secondary | ICD-10-CM | POA: Diagnosis not present

## 2023-10-12 DIAGNOSIS — F17200 Nicotine dependence, unspecified, uncomplicated: Secondary | ICD-10-CM | POA: Diagnosis not present

## 2023-10-12 DIAGNOSIS — Z6831 Body mass index (BMI) 31.0-31.9, adult: Secondary | ICD-10-CM | POA: Diagnosis not present

## 2023-10-12 DIAGNOSIS — E041 Nontoxic single thyroid nodule: Secondary | ICD-10-CM | POA: Diagnosis not present

## 2023-10-12 DIAGNOSIS — N1832 Chronic kidney disease, stage 3b: Secondary | ICD-10-CM | POA: Diagnosis not present

## 2023-10-12 DIAGNOSIS — E7800 Pure hypercholesterolemia, unspecified: Secondary | ICD-10-CM | POA: Diagnosis not present

## 2023-10-25 DIAGNOSIS — E7800 Pure hypercholesterolemia, unspecified: Secondary | ICD-10-CM | POA: Diagnosis not present

## 2023-10-25 DIAGNOSIS — F17200 Nicotine dependence, unspecified, uncomplicated: Secondary | ICD-10-CM | POA: Diagnosis not present

## 2023-10-25 DIAGNOSIS — Z6831 Body mass index (BMI) 31.0-31.9, adult: Secondary | ICD-10-CM | POA: Diagnosis not present

## 2023-10-25 DIAGNOSIS — E041 Nontoxic single thyroid nodule: Secondary | ICD-10-CM | POA: Diagnosis not present

## 2023-10-25 DIAGNOSIS — N1832 Chronic kidney disease, stage 3b: Secondary | ICD-10-CM | POA: Diagnosis not present

## 2023-10-26 DIAGNOSIS — I129 Hypertensive chronic kidney disease with stage 1 through stage 4 chronic kidney disease, or unspecified chronic kidney disease: Secondary | ICD-10-CM | POA: Diagnosis not present

## 2023-10-26 DIAGNOSIS — J449 Chronic obstructive pulmonary disease, unspecified: Secondary | ICD-10-CM | POA: Diagnosis not present

## 2023-10-26 DIAGNOSIS — J9601 Acute respiratory failure with hypoxia: Secondary | ICD-10-CM | POA: Diagnosis not present

## 2023-10-26 DIAGNOSIS — J430 Unilateral pulmonary emphysema [MacLeod's syndrome]: Secondary | ICD-10-CM | POA: Diagnosis not present

## 2023-11-14 DIAGNOSIS — M25572 Pain in left ankle and joints of left foot: Secondary | ICD-10-CM | POA: Diagnosis not present

## 2023-11-14 DIAGNOSIS — M25472 Effusion, left ankle: Secondary | ICD-10-CM | POA: Diagnosis not present

## 2023-11-14 DIAGNOSIS — M25461 Effusion, right knee: Secondary | ICD-10-CM | POA: Diagnosis not present

## 2023-11-14 DIAGNOSIS — M1711 Unilateral primary osteoarthritis, right knee: Secondary | ICD-10-CM | POA: Diagnosis not present

## 2023-11-16 DIAGNOSIS — N183 Chronic kidney disease, stage 3 unspecified: Secondary | ICD-10-CM | POA: Diagnosis not present

## 2023-11-16 DIAGNOSIS — J181 Lobar pneumonia, unspecified organism: Secondary | ICD-10-CM | POA: Diagnosis not present

## 2023-11-16 DIAGNOSIS — E1142 Type 2 diabetes mellitus with diabetic polyneuropathy: Secondary | ICD-10-CM | POA: Diagnosis not present

## 2023-11-16 DIAGNOSIS — R911 Solitary pulmonary nodule: Secondary | ICD-10-CM | POA: Diagnosis not present

## 2023-11-26 DIAGNOSIS — J9601 Acute respiratory failure with hypoxia: Secondary | ICD-10-CM | POA: Diagnosis not present

## 2023-11-26 DIAGNOSIS — I129 Hypertensive chronic kidney disease with stage 1 through stage 4 chronic kidney disease, or unspecified chronic kidney disease: Secondary | ICD-10-CM | POA: Diagnosis not present

## 2023-11-26 DIAGNOSIS — J449 Chronic obstructive pulmonary disease, unspecified: Secondary | ICD-10-CM | POA: Diagnosis not present

## 2023-11-26 DIAGNOSIS — J430 Unilateral pulmonary emphysema [MacLeod's syndrome]: Secondary | ICD-10-CM | POA: Diagnosis not present

## 2023-12-26 DIAGNOSIS — F17210 Nicotine dependence, cigarettes, uncomplicated: Secondary | ICD-10-CM | POA: Diagnosis not present

## 2023-12-26 DIAGNOSIS — R062 Wheezing: Secondary | ICD-10-CM | POA: Diagnosis not present

## 2023-12-26 DIAGNOSIS — K219 Gastro-esophageal reflux disease without esophagitis: Secondary | ICD-10-CM | POA: Diagnosis not present

## 2023-12-26 DIAGNOSIS — R739 Hyperglycemia, unspecified: Secondary | ICD-10-CM | POA: Diagnosis not present

## 2023-12-26 DIAGNOSIS — Q2381 Bicuspid aortic valve: Secondary | ICD-10-CM | POA: Diagnosis not present

## 2023-12-26 DIAGNOSIS — Q230 Congenital stenosis of aortic valve: Secondary | ICD-10-CM | POA: Diagnosis not present

## 2023-12-26 DIAGNOSIS — I7123 Aneurysm of the descending thoracic aorta, without rupture: Secondary | ICD-10-CM | POA: Diagnosis not present

## 2023-12-26 DIAGNOSIS — D0220 Carcinoma in situ of unspecified bronchus and lung: Secondary | ICD-10-CM | POA: Diagnosis not present

## 2023-12-26 DIAGNOSIS — I350 Nonrheumatic aortic (valve) stenosis: Secondary | ICD-10-CM | POA: Diagnosis not present

## 2023-12-26 DIAGNOSIS — C3490 Malignant neoplasm of unspecified part of unspecified bronchus or lung: Secondary | ICD-10-CM | POA: Diagnosis not present

## 2023-12-26 DIAGNOSIS — J449 Chronic obstructive pulmonary disease, unspecified: Secondary | ICD-10-CM | POA: Diagnosis not present

## 2023-12-26 DIAGNOSIS — I3481 Nonrheumatic mitral (valve) annulus calcification: Secondary | ICD-10-CM | POA: Diagnosis not present

## 2023-12-26 DIAGNOSIS — I517 Cardiomegaly: Secondary | ICD-10-CM | POA: Diagnosis not present

## 2023-12-26 DIAGNOSIS — I129 Hypertensive chronic kidney disease with stage 1 through stage 4 chronic kidney disease, or unspecified chronic kidney disease: Secondary | ICD-10-CM | POA: Diagnosis not present

## 2023-12-26 DIAGNOSIS — N1832 Chronic kidney disease, stage 3b: Secondary | ICD-10-CM | POA: Diagnosis not present

## 2023-12-26 DIAGNOSIS — N189 Chronic kidney disease, unspecified: Secondary | ICD-10-CM | POA: Diagnosis not present

## 2023-12-26 DIAGNOSIS — E7800 Pure hypercholesterolemia, unspecified: Secondary | ICD-10-CM | POA: Diagnosis not present

## 2023-12-26 DIAGNOSIS — I342 Nonrheumatic mitral (valve) stenosis: Secondary | ICD-10-CM | POA: Diagnosis not present

## 2023-12-26 DIAGNOSIS — R222 Localized swelling, mass and lump, trunk: Secondary | ICD-10-CM | POA: Diagnosis not present

## 2023-12-26 DIAGNOSIS — R918 Other nonspecific abnormal finding of lung field: Secondary | ICD-10-CM | POA: Diagnosis not present

## 2023-12-26 DIAGNOSIS — I509 Heart failure, unspecified: Secondary | ICD-10-CM | POA: Diagnosis not present

## 2023-12-26 DIAGNOSIS — J9610 Chronic respiratory failure, unspecified whether with hypoxia or hypercapnia: Secondary | ICD-10-CM | POA: Diagnosis not present

## 2023-12-26 DIAGNOSIS — I352 Nonrheumatic aortic (valve) stenosis with insufficiency: Secondary | ICD-10-CM | POA: Diagnosis not present

## 2023-12-26 DIAGNOSIS — Z79899 Other long term (current) drug therapy: Secondary | ICD-10-CM | POA: Diagnosis not present

## 2023-12-26 DIAGNOSIS — K4020 Bilateral inguinal hernia, without obstruction or gangrene, not specified as recurrent: Secondary | ICD-10-CM | POA: Diagnosis not present

## 2023-12-26 DIAGNOSIS — I130 Hypertensive heart and chronic kidney disease with heart failure and stage 1 through stage 4 chronic kidney disease, or unspecified chronic kidney disease: Secondary | ICD-10-CM | POA: Diagnosis not present

## 2023-12-26 DIAGNOSIS — I7121 Aneurysm of the ascending aorta, without rupture: Secondary | ICD-10-CM | POA: Diagnosis not present

## 2023-12-26 DIAGNOSIS — Z7982 Long term (current) use of aspirin: Secondary | ICD-10-CM | POA: Diagnosis not present

## 2023-12-26 DIAGNOSIS — Z7951 Long term (current) use of inhaled steroids: Secondary | ICD-10-CM | POA: Diagnosis not present

## 2023-12-26 DIAGNOSIS — R609 Edema, unspecified: Secondary | ICD-10-CM | POA: Diagnosis not present

## 2023-12-26 DIAGNOSIS — J9601 Acute respiratory failure with hypoxia: Secondary | ICD-10-CM | POA: Diagnosis not present

## 2023-12-26 DIAGNOSIS — R0602 Shortness of breath: Secondary | ICD-10-CM | POA: Diagnosis not present

## 2023-12-26 DIAGNOSIS — E785 Hyperlipidemia, unspecified: Secondary | ICD-10-CM | POA: Diagnosis not present

## 2023-12-26 DIAGNOSIS — I5023 Acute on chronic systolic (congestive) heart failure: Secondary | ICD-10-CM | POA: Diagnosis not present

## 2023-12-26 DIAGNOSIS — J430 Unilateral pulmonary emphysema [MacLeod's syndrome]: Secondary | ICD-10-CM | POA: Diagnosis not present

## 2023-12-26 DIAGNOSIS — J189 Pneumonia, unspecified organism: Secondary | ICD-10-CM | POA: Diagnosis not present

## 2023-12-26 DIAGNOSIS — J90 Pleural effusion, not elsewhere classified: Secondary | ICD-10-CM | POA: Diagnosis not present

## 2023-12-26 DIAGNOSIS — Q231 Congenital insufficiency of aortic valve: Secondary | ICD-10-CM | POA: Diagnosis not present

## 2023-12-26 DIAGNOSIS — R7989 Other specified abnormal findings of blood chemistry: Secondary | ICD-10-CM | POA: Diagnosis not present

## 2023-12-26 DIAGNOSIS — I5033 Acute on chronic diastolic (congestive) heart failure: Secondary | ICD-10-CM | POA: Diagnosis not present

## 2023-12-26 DIAGNOSIS — R0600 Dyspnea, unspecified: Secondary | ICD-10-CM | POA: Diagnosis not present

## 2023-12-26 DIAGNOSIS — J439 Emphysema, unspecified: Secondary | ICD-10-CM | POA: Diagnosis not present

## 2023-12-26 DIAGNOSIS — Z7984 Long term (current) use of oral hypoglycemic drugs: Secondary | ICD-10-CM | POA: Diagnosis not present

## 2023-12-26 DIAGNOSIS — R0601 Orthopnea: Secondary | ICD-10-CM | POA: Diagnosis not present

## 2023-12-26 DIAGNOSIS — I2489 Other forms of acute ischemic heart disease: Secondary | ICD-10-CM | POA: Diagnosis not present

## 2023-12-26 DIAGNOSIS — E1122 Type 2 diabetes mellitus with diabetic chronic kidney disease: Secondary | ICD-10-CM | POA: Diagnosis not present

## 2023-12-26 DIAGNOSIS — R059 Cough, unspecified: Secondary | ICD-10-CM | POA: Diagnosis not present

## 2023-12-29 NOTE — Discharge Summary (Signed)
 Hospitalist Discharge Summary   Discharge date:   December 29, 2023 Length of stay:    LOS: 3 days    Discharge Service:   [facility] Hospitalists Discharge Attending Physician: Casimiro Charlyne Seidel, MD Discharge to:    To Home Condition at Discharge:  good   Mental Status On day of Discharge:  The patient is Alert and oriented to PERSON The patient is Alert And oriented to TIME The patient is Alert and oriented to [facility] course based on timeline of significant events after admission (by date): Tristan Brooks, an 83 year old male with a history of type 2 diabetes, valvular heart disease, hypertension, and possible COPD on home oxygen , was admitted on 12/26/2023 due to shortness of breath. The principal diagnosis was acute on chronic congestive heart failure, supported by elevated proBNP levels of 3251 and imaging findings. A chest CT scan revealed consolidation in the right upper lobe and multiple nodules in the bilateral lower lobes, suggesting a differential diagnosis of infectious/inflammatory versus neoplastic processes. A cardiology consult was obtained, and a repeat echocardiogram showed normal left ventricular ejection fraction, severe mitral annular calcification, mild to moderate mitral stenosis, and moderate aortic valve stenosis. The patient was managed with diuretics, including furosemide, and oxygen  therapy.  During the hospitalization, the patient was also evaluated for elevated troponin levels, which were trended and considered likely due to demand ischemia. His chronic kidney disease was monitored with serum creatinine levels showing slight improvement. 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 Additionally, the patient was due for surgical evaluation of a non-recurrent  bilateral inguinal hernia, which will be addressed post-discharge. Diabetes management included restarting home medications and monitoring glucose levels, with an A1c of 10.6% noted. The patient was discharged with plans for follow-up with his primary cardiologist and continued surveillance of his lung condition.    ______________________________________   Admission HPI   Patient admitted on: 12/26/2023 11:15 AM  Patient admitted by: Casimiro Charlyne Seidel, MD   CHIEF COMPLAINT:  Shortness of breath   Day of admission HPI:   83 year old African-American man with past medical history significant for type 2 diabetes, valvular heart disease, hypertension and possible COPD on home oxygen  who presented to the emergency room today complaining of shortness of breath ongoing for a few days.  Patient was apparently supposed to be going to his PCP today for evaluation of inguinal hernia and arrangement for surgery.  While he was speaking with his daughter on the phone, she noted he was short of breath.  A quick call to his PCP and it was recommended that he come to the emergency room for evaluation.  Patient admits gradually worsening shortness of breath with mild ankle swelling.  He has been using home oxygen  at 2 L/min for the past year, after he sustained a fall with multiple rib fractures, a punctured lung and splenic laceration.  He does have a history of cigarette smoking but at his highest never smoked up to half a pack a day.   On presentation to the emergency room, patient was found with vitals of 136/69, respiration of 20 and O2 saturation of 95 to 97% on 2 L of oxygen .  Labs revealed troponin initially at 819 and subsequently at 777.  proBNP was elevated at 3251 and D-dimer was 1241.  A VQ scan was obtained because  of his history of CKD and showed low probability for embolism.  Patient was given 1 dose of ceftriaxone  and referred for admission  Patient admitted on Home O2? - yes Patient on home  anticoagulant? -  no Patient admitted with Chronic home foley catheter? - no Foley catheter placed or replaced by another service prior to admission? - no  Mental Status on Admission: The patient is Alert and oriented to PERSON The patient is Alert And oriented to TIME The patient is Alert and oriented to LOCATION   Problem List, Assessment & Plan    ASSESSMENT & PLAN (In order of descending acuity)   Shortness of breath     Overview: - Chest CT scan reveals consolidation right upper lobe as well as multiple      nodules bilateral lower lobes     -Differential also includes infectious/inflammatory process versus      neoplastic process     - Patient with known adenocarcinoma in situ since March 2024     - Finding may represent progression of disease     - Patient follows with pulmonology and oncology     - Recommendation is for repeat CT scan in 3 months     - proBNP is also elevated at 3251     - Etiology of shortness of breath likely a combination of both     DM (diabetes mellitus)        Overview: - Restart home medications     - A1c is 10.6%     -Patient will need review of diabetes management     CKD (chronic kidney disease)     Overview: - Serum creatinine today is 1.98, improved from last check     - Will continue to monitor     Elevated troponin        - Possible demand ischemia     - Echocardiogram from November 2024: EF of 55 to 60%, mild LVH, grade 1      diastolic dysfunction, mild to moderate mitral stenosis and moderate to      severe aortic stenosis     - Repeat echo reveals normal LVEF of greater than 55%, severe mitral      annular calcification, mild to moderate mitral stenosis, moderate aortic      valve stenosis with peak AV transvalvular velocity of 3.4     - Place cardiology consult     Acute on chronic congestive heart failure        Overview: - Echocardiogram from November 2024: EF of 55 to 60%, mild LVH, grade 1      diastolic dysfunction, mild  to moderate mitral stenosis and moderate to      severe aortic stenosis     - Repeat echo reveals normal LVEF of greater than 55%, severe mitral      annular calcification, mild to moderate mitral stenosis, moderate aortic      valve stenosis with peak AV transvalvular velocity of 3.4     - Cardiology consult input appreciated          Non-recurrent bilateral inguinal hernia     Overview: - Patient was due for surgical evaluation on day of admission     - Will return to his PCP after this to plan for definitive surgery     Non-mucinous adenocarcinoma in situ of lung     Overview: - First diagnosed in March 2024     - Lesion has mostly been indolent and  largely asymptomatic     - CT scan findings on admission noted, this may be progression of known      lesion     - Patient to follow-up with pulmonary/oncology consults post      discharge for further management  ADDITIONAL PATIENT FINDINGS OR OBSERVATIONS    DVT prophylaxis while in hospital:  enoxaparin    _____________________________________  Vital Signs: BP 119/55   Pulse 77   Temp 36.8 C (98.2 F) (Oral)   Resp 16   Ht 185.4 cm (6' 1)   Wt (!) 107.9 kg (237 lb 14.4 oz)   SpO2 96%   BMI 31.39 kg/m   Nutrition:                                                  CODE STATUS :                    Full Code   Patient discharged on Home O2? - yes Patient discharged on home anticoagulant? -  no Patient discharged with Chronic home foley catheter? - no  Time Spent on Discharge I spent greater than 30 minutes counseling and coordinating care for the discharge of this patient. The patient and I discussed the importance of outpatient follow-up as well as concerning signs and symptoms that would require immediate evaluation by a medical professional. The aforementioned conversation participants understand  and show insight. I use teachback to ensure understanding. The above participant/s is aware that not following  the discussed plan, recommendations, and follow up can lead to severe negative effects on the patient's health, up to and including death.    Discharge Medications     Your Medication List     START taking these medications    amoxicillin-clavulanate 875-125 mg per tablet Commonly known as: AUGMENTIN Take 1 tablet by mouth two (2) times a day for 5 days.       CONTINUE taking these medications    acetaminophen  325 MG tablet Commonly known as: TYLENOL  Take 2 tablets (650 mg total) by mouth every six (6) hours as needed for pain.   albuterol 90 mcg/actuation inhaler Commonly known as: PROVENTIL HFA;VENTOLIN HFA Inhale 2 puffs every four (4) hours as needed.   ANORO ELLIPTA 62.5-25 mcg/actuation inhaler Generic drug: umeclidinium-vilanterol Inhale 1 puff daily.   aspirin  81 MG tablet Commonly known as: ECOTRIN Take 1 tablet (81 mg total) by mouth daily.   atorvastatin 20 MG tablet Commonly known as: LIPITOR TAKE 1 TABLET BY MOUTH IN THE EVENING   diphenhydrAMINE 25 mg capsule Commonly known as: BENADRYL Take 1-2 capsules (25-50 mg total) by mouth.   docusate sodium 100 MG capsule Commonly known as: COLACE Take 1 capsule (100 mg total) by mouth daily.   ezetimibe 10 mg tablet Commonly known as: ZETIA Take 1 tablet (10 mg total) by mouth daily.   gabapentin 100 MG capsule Commonly known as: NEURONTIN Take 1 capsule (100 mg total) by mouth two (2) times a day.   glipiZIDE 5 MG 24 hr tablet Commonly known as: GLUCOTROL XL Take 1 tablet (5 mg total) by mouth daily.   lidocaine  4 % patch Commonly known as: ASPERCREME Place 1 patch on the skin daily.   mirtazapine 15 MG tablet Commonly known as: REMERON TAKE 1 TABLET BY MOUTH AT NIGHT   ONETOUCH  DELICA PLUS LANCET 33 gauge Misc Generic drug: lancets USE 1 TO CHECK GLUCOSE THREE TIMES DAILY   ONETOUCH VERIO TEST STRIPS Strp Generic drug: blood sugar diagnostic USE 1 STRIP TO CHECK GLUCOSE THREE TIMES  DAILY   pantoprazole 40 MG tablet Commonly known as: Protonix Take 1 tablet (40 mg total) by mouth daily before breakfast.   PAXLOVID RENAL DOSE 150 mg (10)- 100 mg (10) tablet Generic drug: nirmatrelvir-ritonavir RENAL DOSE See package instructions.   pioglitazone 30 MG tablet Commonly known as: ACTOS Take 1 tablet (30 mg total) by mouth daily.       ____________________________________________  Discharge Instructions   Nutrition:                                   Activity:                                   Activity Instructions     Activity as tolerated         Appointments:                         Appointments which have been scheduled for you    Feb 14, 2024 1:30 PM RETURN KNEE with Heinz GORMAN Jacobs, FNP [facility] at [facility] [place]) [address] [place] [address] [phone]         Follow Up:                              Follow Up instructions and Outpatient Referrals    Ambulatory Referral to Home Health     Reason for referral: Debilitation due to hospital stay   Physician to follow patient's care: PCP   Disciplines requested:  Nursing Physical Therapy Occupational Therapy Medical Social Work     Nursing requested: Teaching/skilled observation and assessment   What teaching is needed (new diagnosis? new medications?): Medications  and home oxygen , copd teaching   Physical Therapy requested:  Home safety evaluation Evaluate and treat     Occupational Therapy Requested:  Home safety evaluation Evaluate and treat     Medical Social Work requested: General area of patient need   General area of patient need: Help with medicaid assistance and community  programs       Allergies  Allergies[1]    Past Medical History  Past Medical History[2]     Lab Results   Recent Labs    Units 12/26/23 1128 12/27/23 0457 12/29/23 1140  WBC 10*9/L 5.5 5.1 4.9  HGB g/dL 9.9* 9.9*  9.9*  HCT % 31.5* 30.5* 31.5*  PLT 10*9/L 230 215 203   Recent Labs    Units 12/26/23 1128 12/27/23 0458  NA mmol/L 138 138  K mmol/L 4.5 3.9  CL mmol/L 104 101  CO2 mmol/L 25.2 28.0  BUN mg/dL 36* 31*  CREATININE mg/dL 8.01* 8.03*  GLU mg/dL 678* 799*  CALCIUM mg/dL 8.9 9.2  ALBUMIN g/dL 3.3* 3.3*  PROT g/dL 7.1 6.8  BILITOT mg/dL 0.2* 0.3  AST U/L 41* 43*  ALT U/L 52 56  ALKPHOS U/L 107 78  MG mg/dL  --  1.9  PHOS mg/dL  --  4.2   Recent Labs  Units 12/26/23 1128 12/26/23 1312 12/26/23 1727  TROPONINI ng/L 819* 777* 780*   Recent Labs    Units 12/26/23 1129  DDIMER ng/mL FEU 1,241*   Recent Labs    Units 12/26/23 1727  A1C % 10.6*     Recent Labs    Units 12/26/23 1556  WBCUA /HPF 0  NITRITE  Negative  LEUKOCYTESUR  Negative  BACTERIA /HPF None Seen  RBCUA /HPF 3  BLOODU  Trace-intact*  GLUCOSEU  Negative  PROTEINUA  Negative  KETONESU  Negative    Recent Labs    12/26/23 1238  PHART 7.34*  PCO2ART 46.0  PO2ART 64*  HCO3ART 24.6*  O2SATART 92.7*  BEART -1.4    Imaging  ECG 12 Lead Result Date: 12/28/2023 Normal sinus rhythm Left axis deviation Left ventricular hypertrophy with QRS widening ( R in aVL , Cornell product ) Cannot rule out Septal infarct (cited on or before 11-May-2023) T wave abnormality, consider lateral ischemia Abnormal ECG When compared with ECG of 11-May-2023 06:49, Questionable change in initial forces of Septal leads T wave inversion no longer evident in Inferior leads T wave inversion less evident in Lateral leads  Echocardiogram W Colorflow Spectral Doppler Result Date: 12/28/2023 Patient Info Name:     Tristan Brooks Age:     82 years DOB:     1940-07-13 Gender:     Male MRN:     [id number] Accession #:     [id number] [facility] Account #:     [id number] Ht:     185 cm Wt:     108 kg BSA:     2.38 m2 BP:     129 /     51 mmHg HR:     83 bpm Exam Date:     12/27/2023 10:50 AM Admit Date:     12/26/2023 Exam Type:      ECHOCARDIOGRAM W COLORFLOW SPECTRAL DOPPLER Technical Quality:     Fair Staff Sonographer:     Mliss Gate Supervising Physician:     Concha Lonni An MD Referring Physician:     Casimiro Charlyne Seidel Ordering Physician:     Prentice Jama Medley Study Info Indications      - Shortness of breath,  elevated proBNP 3251 Procedure(s)   Complete two-dimensional, color flow and Doppler transthoracic echocardiogram is performed. Summary   1. Technically difficult study.   2. The left ventricle is normal in size with moderately increased wall thickness.   3. The left ventricular systolic function is normal, LVEF is visually estimated at > 55%.   4. Mitral annular calcification is present (severe).   5. There is mild to moderate mitral stenosis.   6. There is mild aortic regurgitation.   7. There is moderate aortic valve stenosis.   8. The right ventricle is not well visualized but probably normal in size, with normal systolic function. Left Ventricle   The left ventricle is normal in size with moderately increased wall thickness. The left ventricular systolic function is normal, LVEF is visually estimated at > 55%. Left ventricular diastolic function cannot be accurately assessed with severe MAC. Right Ventricle   The right ventricle is not well visualized but probably normal in size, with normal systolic function. Left Atrium   The left atrium is not well visualized but probably normal in size. Right Atrium   The right atrium is not well visualized but probably normal in size. Aortic Valve   The aortic valve is not well visualized on short axis  images. Leaflets appear thickened with reduced excursion. There is mild aortic regurgitation. There is moderate aortic valve stenosis. Peak AV transvalvular velocity:  3.4 m/s. Mean gradient: 27 mmHg. Doppler velocity index: 0.30. Estimated aortic valve area (VTI): 1.1 cm2. Estimated aortic valve area (velocity): 0.8 cm2. LVOT diameter:  1.8 cm. LVOT stroke volume index: 28  ml/m2. Mitral Valve   The mitral valve is not well visualized. Mitral annular calcification is present (severe). There is no significant mitral valve regurgitation. There is mild to moderate mitral stenosis. Peak velocity:  1.7 m/sec. Mean diastolic gradient: 6 mmHg at a heart rate of 80 bpm. Tricuspid Valve   The tricuspid valve is not well visualized. There is no significant tricuspid regurgitation. The pulmonary systolic pressure cannot be estimated due to insufficient TR signal. Pulmonic Valve   Pulmonary valve is not well visualized. Aorta   The aorta is not well visualized. Inferior Vena Cava   The IVC is suboptimally visualized but probably suggests normal right atrial pressure. Pericardium/Pleural   There is no pericardial effusion. Other Findings   Rhythm: Sinus Rhythm with PVCs. Ventricles ---------------------------------------------------------------------- Name                                 Value        Normal ---------------------------------------------------------------------- LV Dimensions 2D/MM ----------------------------------------------------------------------  IVS Diastolic Thickness (2D)                                1.6 cm       0.6-1.0 LVID Diastole (2D)                  4.5 cm       4.2-5.8  LVPW Diastolic Thickness (2D)                                1.4 cm       0.6-1.0 LVID Systole (2D)                   4.0 cm       2.5-4.0 LVOT Diameter                       1.8 cm               LV Mass Index (2D Cubed)          116 g/m2        49-115  Relative Wall Thickness (2D)                                  0.60        <=0.42 LV Function ---------------------------------------------------------------------- LV EF (4C MOD)                        54 %                LV Diastolic Volume Index (BP MOD)                        41.0 ml/m2     34.0-74.0 RV Dimensions 2D/MM ----------------------------------------------------------------------  RV Basal Diastolic Dimension  4.2 cm       2.5-4.1 TAPSE                               2.8 cm         >=1.7 Atria ---------------------------------------------------------------------- Name                                 Value        Normal ---------------------------------------------------------------------- LA Dimensions ---------------------------------------------------------------------- LA Dimension (2D)                   3.2 cm       3.0-4.1 LA Volume Index (4C A-L)        23.66 ml/m2               LA Volume Index (2C A-L)        32.20 ml/m2               LA Volume (BP MOD)                   69 ml               LA Volume Index (BP MOD)        28.96 ml/m2   16.00-34.00 RA Dimensions ---------------------------------------------------------------------- RA Area (4C)                      14.9 cm2        <=18.0 RA Area (4C) Index              6.3 cm2/m2               RA ESV Index (4C MOD)             17 ml/m2         18-32 Left Ventricular Outflow Tract ---------------------------------------------------------------------- Name                                 Value        Normal ---------------------------------------------------------------------- LVOT 2D ---------------------------------------------------------------------- LVOT Diameter                       1.8 cm               LVOT Area                          2.5 cm2               LVOT Doppler ---------------------------------------------------------------------- LVOT Peak Velocity                 1.0 m/s               LVOT VTI                             26 cm               MV VTI/LVOT VTI Ratio                 1.95               LVOT Stroke Volume  67 ml               LVOT SI                           28 ml/m2 Aortic Valve ---------------------------------------------------------------------- Name                                 Value        Normal ---------------------------------------------------------------------- AV Doppler  ---------------------------------------------------------------------- AV Peak Velocity                   3.4 m/s               AV Peak Gradient                   47 mmHg               AV Mean Gradient                   27 mmHg               AV VTI                               63 cm               AV Area (Cont Eq VTI)              1.1 cm2         >=3.0 AV Area Index (Cont Eq VTI)     0.5 cm2/m2               AV Area (Cont Eq Vel)              0.8 cm2               AV Area Index (Cont Eq Vel)     0.3 cm2/m2               AV DI (Vel)                           0.30               AV DI (VTI)                           0.42 Mitral Valve ---------------------------------------------------------------------- Name                                 Value        Normal ---------------------------------------------------------------------- MV Doppler ---------------------------------------------------------------------- MV Peak Velocity                   1.7 m/s               MV Mean Gradient                    6 mmHg               MV Area (Cont Eq VTI)              1.3 cm2  MV Area Index (Cont Eq VTI)     0.5 cm2/m2               MV Diastolic Function ---------------------------------------------------------------------- MV E Peak Velocity                121 cm/s               MV A Peak Velocity                142 cm/s               MV E/A                                 0.9               MV Annular TDI ---------------------------------------------------------------------- MV Septal e' Velocity             5.2 cm/s         >=8.0 MV E/e' (Septal)                      23.2               MV Lateral e' Velocity            7.4 cm/s        >=10.0 MV E/e' (Lateral)                     16.4               MV e' Average                     6.3 cm/s               MV E/e' (Average)                     19.8 Aorta ---------------------------------------------------------------------- Name                                 Value         Normal ---------------------------------------------------------------------- Ascending Aorta ---------------------------------------------------------------------- Ao Root Diameter (2D)               2.5 cm               Ao Root Diam Index (2D)          1.0 cm/m2 Venous ---------------------------------------------------------------------- Name                                 Value        Normal ---------------------------------------------------------------------- IVC/SVC ---------------------------------------------------------------------- IVC Diameter (Insp 2D)              0.3 cm               IVC Diameter (Exp 2D)               1.4 cm         <=2.1  IVC Diameter Percent Change (2D)                                  79 %          >=  50 Report Signatures Finalized by Concha Lonni An  MD on 12/28/2023 05:13 AM  NM lung ventilation perfusion Result Date: 12/26/2023 Exam:  Radionuclide Ventilation Perfusion Scan  History:  83 years old short of breath  Technique: Radiopharmaceutical: 42.6 mCi of Tc-6m DTPA aerosol administered via mouthpiece during tidal breathing followed by anterior, posterior, lateral and oblique views for the ventilation portion of the examination.  Patient had difficulty tolerating the ventilation study (unable to breathe deeply, difficulty with mask)  Radiopharmaceutical: 5.4 mCi of Tc-15m MAA was administered intravenously followed by planar anterior, posterior, lateral and oblique views for the perfusion portion of the examination.  Comparison:  Chest CT without contrast 12/26/2023.  Chest x-ray 12/26/2023 chest CT 03/27/2023  Findings:  VENTILATION:  Ventilation study limited.  Relative decreased ventilation to the right midlung roughly corresponds to chronic consolidation in the right middle lobe.  PERFUSION:   On perfusion, relatively decreased perfusion right mid to lower lung, again corresponding to area of dense chronic consolidation.  No segmental defects elsewhere.   CHEST X-RAY CORRELATION:  Recent chest CT with apical predominant emphysematous changes as well as chronic consolidation right middle lobe.  When perfusion imaging correlated with recent chest x-ray and CT, findings felt low probability for pulmonary embolism.    Low probability for pulmonary embolism.  Signed (Electronic Signature): 12/26/2023 3:05 PM Signed By: Norleen Granville, MD  CT chest without contrast Result Date: 12/26/2023 Exam: CT of the Chest without Contrast  History: Shortness of breath.  Technique: Contiguous axial images were obtained through the chest without intravenous contrast. Multiplanar and maximum intensity projection coronal reformats were obtained. AEC (automated exposure control) and/or manual techniques such as size-specific kV and mAs are employed where appropriate to reduce radiation exposure for all CT exams.  Comparison: 03/27/2023   Findings: LUNGS: Upper lobe predominant paraseptal and centrilobular emphysema. Unchanged bronchogram containing consolidative attenuation involving the lateral segment right upper lobe. 11 mm cystic and solid nodule in the juxtapleural right lower lobe (series 2 image 54). Additional solid nodules in the bilateral lower lobes measuring up to 8 mm in the right lower lobe posterior/lateral basilar segment, demonstrating mild spiculation (series 2 image 70).SABRA  PLEURA: Small right pleural effusion. No left effusion or bilateral pneumothorax.  HEART: Normal heart size. No pericardial effusion. Mitral annular calcifications. Mild coronary artery calcifications.  GREAT VESSELS: Unchanged aneurysmal dilatation of the ascending and descending thoracic aorta, measuring up to 4.5 and 3.9 cm respectively. Calcified atherosclerosis of the great vessels and aorta.  MEDIASTINUM: No lymphadenopathy.  BONES: No aggressive lesion. No acute abnormality. Degenerative changes of the left glenohumeral joint and spine.  SOFT TISSUES: Normal.  UPPER ABDOMEN: Nonoptimized  assessment of the upper abdomen secondary to noncontrast technique and field of view.    1.    Unchanged bronchogram containing consolidative attenuation involving the lateral segment right upper lobe, which may represent atelectasis or pneumonia. 2.    Multiple solid nodules in the bilateral lower lobes measuring up to 8 mm in the right lower lobe posterior/lateral basilar segment, demonstrating mild spiculation. Given the increase in number and size of nodules since prior exam in 2024, the findings could represent infectious/inflammatory or neoplastic process. Short interval CT follow-up in 3 months or PET/CT could be performed for further characterization as clinically indicated. 3.    Unchanged aneurysmal dilatation of the ascending and descending thoracic aorta, measuring up to 4.5 and 3.9 cm respectively.  Signed (Electronic Signature): 12/26/2023 1:32 PM Signed By: Thayer Kitty, MD  XR Chest Portable Result Date: 12/26/2023 Exam:  Portable Chest  History:  Dyspnea  Technique:  Single frontal view.  Comparison:  05/11/2023  Findings:   Cardiac and mediastinal contours are unchanged. Slightly improved lung volumes. Heterogeneous opacities are seen in both lungs, most confluent at the right lung base. Small right effusion or pleural thickening. No pneumothorax.    Slightly improved lung volumes. Chronic heterogeneous opacities in both lungs.  Signed (Electronic Signature): 12/26/2023 11:31 AM Signed By: Dorothyann Jointer, MD   Casimiro JAYSON Seidel, MD Hospitalist, [facility] 12/29/23, 12:25 PM      [1] No Known Allergies [2] Past Medical History: Diagnosis Date  . Chronic kidney disease   . Diabetes mellitus      . GERD (gastroesophageal reflux disease)   . Hypercholesteremia   . Proteinuria, unspecified

## 2023-12-30 NOTE — Care Plan (Signed)
 Transition of Care Encounter Data   Call attempt: 1 Admission date: 12/26/23 Discharge date: 12/29/23 Discharge diagnosis: Shortness of breath Do you have a hospital follow up appointment?: Yes - Within 14 Days, Yes with Primary PCP clinic F/U Date: 01/01/24 F/U Provider: Vaughn Pouch, MD Patient post discharge: Medications:      SABRA   [facility]: [phone]:  .  [facility]: [phone]:  .  Other: Contact PCP:      [facility] TRANSITIONAL CASE MANAGEMENT SUMMARY NOTE   Attempted to contact patient today at Cell to complete Transitional Case Management call from [facility]. Left message for patient to return call; direct phone number included in message left for patient; 1st attempt.            Lucienne JINNY Pouch, RN

## 2023-12-31 NOTE — Care Plan (Signed)
 Transition of Care Encounter Data   Call attempt: 2 Admission date: 12/26/23 Discharge date: 12/29/23 Discharge diagnosis: Shortness of breath Patient post discharge: Medications:      SABRA   [facility]: [phone]:  .  [facility]: [phone]:  .  Other: Contact PCP:       [facility] TRANSITIONAL CASE MANAGEMENT SUMMARY NOTE   Attempted to contact patient today at Cell to complete Transitional Case Management call from [facility]. Left message for patient to return call; direct phone number included in message left for patient; 2nd attempt.           Nisha Subedi Sarma, RN

## 2024-01-01 ENCOUNTER — Telehealth: Payer: Self-pay

## 2024-01-01 DIAGNOSIS — N1832 Chronic kidney disease, stage 3b: Secondary | ICD-10-CM | POA: Diagnosis not present

## 2024-01-01 DIAGNOSIS — N189 Chronic kidney disease, unspecified: Secondary | ICD-10-CM | POA: Diagnosis not present

## 2024-01-01 DIAGNOSIS — I080 Rheumatic disorders of both mitral and aortic valves: Secondary | ICD-10-CM | POA: Diagnosis not present

## 2024-01-01 DIAGNOSIS — Z556 Problems related to health literacy: Secondary | ICD-10-CM | POA: Diagnosis not present

## 2024-01-01 DIAGNOSIS — Z9181 History of falling: Secondary | ICD-10-CM | POA: Diagnosis not present

## 2024-01-01 DIAGNOSIS — Z9981 Dependence on supplemental oxygen: Secondary | ICD-10-CM | POA: Diagnosis not present

## 2024-01-01 DIAGNOSIS — I130 Hypertensive heart and chronic kidney disease with heart failure and stage 1 through stage 4 chronic kidney disease, or unspecified chronic kidney disease: Secondary | ICD-10-CM | POA: Diagnosis not present

## 2024-01-01 DIAGNOSIS — E041 Nontoxic single thyroid nodule: Secondary | ICD-10-CM | POA: Diagnosis not present

## 2024-01-01 DIAGNOSIS — F17210 Nicotine dependence, cigarettes, uncomplicated: Secondary | ICD-10-CM | POA: Diagnosis not present

## 2024-01-01 DIAGNOSIS — D0220 Carcinoma in situ of unspecified bronchus and lung: Secondary | ICD-10-CM | POA: Diagnosis not present

## 2024-01-01 DIAGNOSIS — F17200 Nicotine dependence, unspecified, uncomplicated: Secondary | ICD-10-CM | POA: Diagnosis not present

## 2024-01-01 DIAGNOSIS — E1122 Type 2 diabetes mellitus with diabetic chronic kidney disease: Secondary | ICD-10-CM | POA: Diagnosis not present

## 2024-01-01 DIAGNOSIS — Z7984 Long term (current) use of oral hypoglycemic drugs: Secondary | ICD-10-CM | POA: Diagnosis not present

## 2024-01-01 DIAGNOSIS — I509 Heart failure, unspecified: Secondary | ICD-10-CM | POA: Diagnosis not present

## 2024-01-01 DIAGNOSIS — Z7982 Long term (current) use of aspirin: Secondary | ICD-10-CM | POA: Diagnosis not present

## 2024-01-01 DIAGNOSIS — Z5982 Transportation insecurity: Secondary | ICD-10-CM | POA: Diagnosis not present

## 2024-01-01 DIAGNOSIS — Z6832 Body mass index (BMI) 32.0-32.9, adult: Secondary | ICD-10-CM | POA: Diagnosis not present

## 2024-01-01 DIAGNOSIS — E7800 Pure hypercholesterolemia, unspecified: Secondary | ICD-10-CM | POA: Diagnosis not present

## 2024-01-01 DIAGNOSIS — J432 Centrilobular emphysema: Secondary | ICD-10-CM | POA: Diagnosis not present

## 2024-01-02 ENCOUNTER — Telehealth: Payer: Self-pay

## 2024-01-02 DIAGNOSIS — I130 Hypertensive heart and chronic kidney disease with heart failure and stage 1 through stage 4 chronic kidney disease, or unspecified chronic kidney disease: Secondary | ICD-10-CM | POA: Diagnosis not present

## 2024-01-02 DIAGNOSIS — Z5982 Transportation insecurity: Secondary | ICD-10-CM | POA: Diagnosis not present

## 2024-01-02 DIAGNOSIS — I509 Heart failure, unspecified: Secondary | ICD-10-CM | POA: Diagnosis not present

## 2024-01-02 DIAGNOSIS — J432 Centrilobular emphysema: Secondary | ICD-10-CM | POA: Diagnosis not present

## 2024-01-02 DIAGNOSIS — F17210 Nicotine dependence, cigarettes, uncomplicated: Secondary | ICD-10-CM | POA: Diagnosis not present

## 2024-01-02 DIAGNOSIS — N189 Chronic kidney disease, unspecified: Secondary | ICD-10-CM | POA: Diagnosis not present

## 2024-01-02 DIAGNOSIS — Z556 Problems related to health literacy: Secondary | ICD-10-CM | POA: Diagnosis not present

## 2024-01-02 DIAGNOSIS — D0220 Carcinoma in situ of unspecified bronchus and lung: Secondary | ICD-10-CM | POA: Diagnosis not present

## 2024-01-02 DIAGNOSIS — I080 Rheumatic disorders of both mitral and aortic valves: Secondary | ICD-10-CM | POA: Diagnosis not present

## 2024-01-02 DIAGNOSIS — Z7984 Long term (current) use of oral hypoglycemic drugs: Secondary | ICD-10-CM | POA: Diagnosis not present

## 2024-01-02 DIAGNOSIS — Z9181 History of falling: Secondary | ICD-10-CM | POA: Diagnosis not present

## 2024-01-02 DIAGNOSIS — Z9981 Dependence on supplemental oxygen: Secondary | ICD-10-CM | POA: Diagnosis not present

## 2024-01-02 DIAGNOSIS — Z7982 Long term (current) use of aspirin: Secondary | ICD-10-CM | POA: Diagnosis not present

## 2024-01-02 DIAGNOSIS — E1122 Type 2 diabetes mellitus with diabetic chronic kidney disease: Secondary | ICD-10-CM | POA: Diagnosis not present

## 2024-01-02 NOTE — Transitions of Care (Post Inpatient/ED Visit) (Signed)
   01/02/2024  Name: Tristan Brooks MRN: [id number] DOB: 09-07-1940  Today's TOC FU Call Status: Today's TOC FU Call Status:: Unsuccessful Call (1st Attempt) Unsuccessful Call (1st Attempt) Date: 01/01/24  Attempted to reach the patient regarding the most recent Inpatient/ED visit. (Late Entry Note from 01/01/24 outreach attempt that was unsuccessful).   Follow Up Plan: Additional outreach attempts will be made to reach the patient to complete the Transitions of Care (Post Inpatient/ED visit) call.   (Late Entry Note from 01/01/24 outreach attempt that was unsuccessful).   Bing Edison MSN, RN RN Case Sales executive Health  [facility] Office Hours M-F [phone] Direct Dial: [phone] Main Phone [phone]  Fax: [phone] [email or web address]

## 2024-01-02 NOTE — Transitions of Care (Post Inpatient/ED Visit) (Signed)
   01/02/2024  Name: AMEET SANDY MRN: [id number] DOB: 11-05-1940  Today's TOC FU Call Status: Today's TOC FU Call Status:: Unsuccessful Call (2nd Attempt) Unsuccessful Call (2nd Attempt) Date: 01/02/24  Attempted to reach the patient regarding the most recent Inpatient/ED visit.  Follow Up Plan: Additional outreach attempts will be made to reach the patient to complete the Transitions of Care (Post Inpatient/ED visit) call.    Bing Edison MSN, RN RN Case Sales executive Health  [facility] Office Hours M-F [phone] Direct Dial: [phone] Main Phone [phone]  Fax: [phone] [email or web address]

## 2024-01-03 ENCOUNTER — Telehealth: Payer: Self-pay

## 2024-01-04 DIAGNOSIS — Z9981 Dependence on supplemental oxygen: Secondary | ICD-10-CM | POA: Diagnosis not present

## 2024-01-04 DIAGNOSIS — I509 Heart failure, unspecified: Secondary | ICD-10-CM | POA: Diagnosis not present

## 2024-01-04 DIAGNOSIS — Z5982 Transportation insecurity: Secondary | ICD-10-CM | POA: Diagnosis not present

## 2024-01-04 DIAGNOSIS — Z9181 History of falling: Secondary | ICD-10-CM | POA: Diagnosis not present

## 2024-01-04 DIAGNOSIS — D0220 Carcinoma in situ of unspecified bronchus and lung: Secondary | ICD-10-CM | POA: Diagnosis not present

## 2024-01-04 DIAGNOSIS — I130 Hypertensive heart and chronic kidney disease with heart failure and stage 1 through stage 4 chronic kidney disease, or unspecified chronic kidney disease: Secondary | ICD-10-CM | POA: Diagnosis not present

## 2024-01-04 DIAGNOSIS — E1122 Type 2 diabetes mellitus with diabetic chronic kidney disease: Secondary | ICD-10-CM | POA: Diagnosis not present

## 2024-01-04 DIAGNOSIS — Z7984 Long term (current) use of oral hypoglycemic drugs: Secondary | ICD-10-CM | POA: Diagnosis not present

## 2024-01-04 DIAGNOSIS — F17210 Nicotine dependence, cigarettes, uncomplicated: Secondary | ICD-10-CM | POA: Diagnosis not present

## 2024-01-04 DIAGNOSIS — Z556 Problems related to health literacy: Secondary | ICD-10-CM | POA: Diagnosis not present

## 2024-01-04 DIAGNOSIS — I080 Rheumatic disorders of both mitral and aortic valves: Secondary | ICD-10-CM | POA: Diagnosis not present

## 2024-01-04 DIAGNOSIS — J432 Centrilobular emphysema: Secondary | ICD-10-CM | POA: Diagnosis not present

## 2024-01-04 DIAGNOSIS — N189 Chronic kidney disease, unspecified: Secondary | ICD-10-CM | POA: Diagnosis not present

## 2024-01-04 DIAGNOSIS — Z7982 Long term (current) use of aspirin: Secondary | ICD-10-CM | POA: Diagnosis not present

## 2024-01-05 ENCOUNTER — Telehealth: Payer: Self-pay | Admitting: Cardiology

## 2024-01-05 ENCOUNTER — Ambulatory Visit: Attending: Cardiology | Admitting: Cardiology

## 2024-01-05 ENCOUNTER — Encounter: Payer: Self-pay | Admitting: Cardiology

## 2024-01-05 VITALS — BP 130/78 | HR 78 | Ht 73.0 in | Wt 237.0 lb

## 2024-01-05 DIAGNOSIS — R609 Edema, unspecified: Secondary | ICD-10-CM | POA: Diagnosis not present

## 2024-01-05 DIAGNOSIS — I10 Essential (primary) hypertension: Secondary | ICD-10-CM | POA: Diagnosis not present

## 2024-01-05 DIAGNOSIS — Z79899 Other long term (current) drug therapy: Secondary | ICD-10-CM

## 2024-01-05 DIAGNOSIS — I5032 Chronic diastolic (congestive) heart failure: Secondary | ICD-10-CM

## 2024-01-05 DIAGNOSIS — I350 Nonrheumatic aortic (valve) stenosis: Secondary | ICD-10-CM | POA: Diagnosis not present

## 2024-01-05 MED ORDER — FUROSEMIDE 20 MG PO TABS
20.0000 mg | ORAL_TABLET | Freq: Every day | ORAL | 3 refills | Status: AC
  Filled 2024-07-02: qty 90, 90d supply, fill #0

## 2024-01-05 NOTE — Patient Instructions (Signed)
 Medication Instructions:  Your physician has recommended you make the following change in your medication:   Start Lasix 20 mg Daily   *If you need a refill on your cardiac medications before your next appointment, please call your pharmacy*  Lab Work: Your physician recommends that you return for lab work in: 2 Weeks ( BMP, Mg, BNP)   If you have labs (blood work) drawn today and your tests are completely normal, you will receive your results only by: MyChart Message (if you have MyChart) OR A paper copy in the mail If you have any lab test that is abnormal or we need to change your treatment, we will call you to review the results.  Testing/Procedures: NONE   Follow-Up: At [facility], you and your health needs are our priority.  As part of our continuing mission to provide you with exceptional heart care, our providers are all part of one team.  This team includes your primary Cardiologist (physician) and Advanced Practice Providers or APPs (Physician Assistants and Nurse Practitioners) who all work together to provide you with the care you need, when you need it.  Your next appointment:   1 month(s)  Provider:   Almarie Crate, NP    We recommend signing up for the patient portal called MyChart.  Sign up information is provided on this After Visit Summary.  MyChart is used to connect with patients for Virtual Visits (Telemedicine).  Patients are able to view lab/test results, encounter notes, upcoming appointments, etc.  Non-urgent messages can be sent to your provider as well.   To learn more about what you can do with MyChart, go to [email or web address].   Other Instructions Thank you for choosing [facility]!

## 2024-01-05 NOTE — Progress Notes (Signed)
 Clinical Summary Tristan Brooks is a 83 y.o.male seen today for follow up of the following medical problems.    1. Aortic stenosis -12/2020 echo LVEF 50-55%, hypokiensis inferior/inferolateral walls, grade II dd, AV mean grade 22, DI 0.37, AVA VTI 0.94 -01/2022 echo: LVEF 55-60%, mod AS AVA VTI 1.11, mean grad 24, DI 0.44 -04/2023 echo: LVEF 55-60%, grade I dd.  mild to mod MS mean grad 5, moderate to severe paradoxical low flow low gradient AS Mean gradient 21 mmHg, AVA VTI 0.93, DI 0.30, SVI 23  -12/2023 echo [facility]: LVEF >55%, mild to mod MS, mod AS mean grad 27, DI 0.30, AVA VTI 1.1, SVI 28.   2.Chronic HFpEF -new diagnosis during recent admissoin [facility] 12/2023 with SOB -12/2023 echo [facility]: LVEF >55%, mild to mod MS, mod AS mean grad 27, DI 0.30, AVA VTI 1.1, SVI 28.   - BNP 3251,  VQ scan was negative - CT chest: consolidated RUL chronic,   -discharge weight 237 lbs - reports SOB improved but not quite back to baseline.       Other medical problems not addressed this visit   2. Hyperlipidemia - labs followed by pcp      3. CKD - last Cr was 2         4. Lung mass -followed by [facility] - bronch with evidence of adenocarcinoma, though very stable over time. Undergoing surveillance.     5. Aortic aneurysm - 3. Enlargement of the tubular ascending thoracic aorta, measuring up  to 4.3 x 4.2 cm CT chest [facility] 05/2022 mild aneurysm 4.2 cm ascending aorta  12/2023 CT [facility]: 4.5 cm ascending, 3.9 cm descending   6. Chronic respiratory failure on home O2 - has ome O2 as needed at home.    Wife passed recently 02/2020 Past Medical History:  Diagnosis Date   Diabetes mellitus without complication (HCC)    Neuropathy      No Known Allergies   Current Outpatient Medications  Medication Sig Dispense Refill   albuterol (VENTOLIN HFA) 108 (90 Base) MCG/ACT inhaler Inhale 2 puffs into the lungs every 4 (four) hours as needed for shortness of breath or wheezing.      aspirin  EC 81 MG tablet Take 1 tablet (81 mg total) by mouth daily. 90 tablet 3   atorvastatin (LIPITOR) 20 MG tablet Take 20 mg by mouth daily.     Cholecalciferol (VITAMIN D-3) 25 MCG (1000 UT) CAPS Take 1 capsule by mouth daily.     ezetimibe (ZETIA) 10 MG tablet Take 1 tablet by mouth daily.     Ferrous Sulfate Dried (HIGH POTENCY IRON) 65 MG TABS Take 1 tablet by mouth daily.     furosemide (LASIX) 20 MG tablet Take 1 tablet (20 mg total) by mouth daily. 90 tablet 3   gabapentin (NEURONTIN) 100 MG capsule Take 100 mg by mouth 2 (two) times daily.     glipiZIDE (GLUCOTROL) 5 MG tablet Take 5 mg by mouth every morning.     mirtazapine (REMERON) 15 MG tablet Take 15 mg by mouth at bedtime.     ONETOUCH VERIO test strip 1 each by Other route as needed for other.     pantoprazole (PROTONIX) 40 MG tablet Take 40 mg by mouth daily.     pioglitazone (ACTOS) 30 MG tablet Take 30 mg by mouth daily.     losartan (COZAAR) 25 MG tablet Take 12.5 mg by mouth daily. (Patient not taking: Reported on  01/05/2024)     No current facility-administered medications for this visit.     Past Surgical History:  Procedure Laterality Date   ROTATOR CUFF REPAIR Right    TOTAL KNEE ARTHROPLASTY       No Known Allergies    Family History  Problem Relation Age of Onset   Heart Problems Father    Heart attack Sister      Social History Mr. Dunlevy reports that he has been smoking cigarettes. He has never used smokeless tobacco. Mr. Kirsh reports no history of alcohol  use.      Physical Examination Today's Vitals   01/05/24 1429 01/05/24 1540  BP: (!) 146/64 130/78  Pulse: 78   SpO2: 90%   Weight: 237 lb (107.5 kg)   Height: 6' 1 (1.854 m)    Body mass index is 31.27 kg/m.  Gen: resting comfortably, no acute distress HEENT: no scleral icterus, pupils equal round and reactive, no palptable cervical adenopathy,  CV: RRR, no m/r,g no jvd Resp: mild crackles bilaterally GI: abdomen  is soft, non-tender, non-distended, normal bowel sounds, no hepatosplenomegaly MSK: no edema Skin: warm, no rash Neuro:  no focal deficits Psych: appropriate affect   Diagnostic Studies  09/2016 echo Study Conclusions   - Left ventricle: The cavity size was normal. Wall thickness was   normal. Systolic function was at the lower limits of normal. The   estimated ejection fraction was in the range of 50% to 55%.   Doppler parameters are consistent with abnormal left ventricular   relaxation (grade 1 diastolic dysfunction). Doppler parameters   are consistent with high ventricular filling pressure. - Regional wall motion abnormality: Mild hypokinesis of the   basal-mid inferior and mid inferolateral myocardium. - Aortic valve: Moderately to severely calcified annulus.   Moderately thickened, moderately calcified leaflets. There was   moderate to severe stenosis. There was mild to moderate   regurgitation. Peak velocity (S): 377 cm/s. Mean gradient (S): 30   mm Hg. Valve area (VTI): 0.83 cm^2. Valve area (Vmax): 0.78 cm^2.   Valve area (Vmean): 0.8 cm^2. - Aorta: Mild aortic root dilatation. Aortic root dimension: 40 mm   (ED). - Mitral valve: Moderately to severely calcified annulus. - Left atrium: The atrium was severely dilated.     07/2017 echo   Study Conclusions   - Left ventricle: The cavity size was normal. Wall thickness was   normal. Systolic function was normal. The estimated ejection   fraction was in the range of 55% to 60%. Wall motion was normal;   there were no regional wall motion abnormalities. Doppler   parameters are consistent with abnormal left ventricular   relaxation (grade 1 diastolic dysfunction). Doppler parameters   are consistent with high ventricular filling pressure. - Aortic valve: Moderately to severely calcified annulus.   Trileaflet; moderately calcified leaflets. There was moderate   stenosis. There was moderate regurgitation. Peak velocity  (S):   323 cm/s. Mean gradient (S): 22 mm Hg. Valve area (VTI): 1.3   cm^2. Valve area (Vmax): 1.1 cm^2. Valve area (Vmean): 1.09 cm^2. - Mitral valve: Calcified annulus.   08/2018 echo IMPRESSIONS      1. The left ventricle has normal systolic function, with an ejection fraction of 55-60%. The cavity size was normal. There is mildly increased left ventricular wall thickness. Left ventricular diastolic Doppler parameters are consistent with impaired  relaxation. Possible basal inferolateral hypokinesis .  2. The right ventricle has normal systolic function. The cavity was normal. There  is no increase in right ventricular wall thickness. Right ventricular systolic pressure normal with an estimated pressure of 9.2 mmHg.  3. The aortic valve is functionally bicuspid. Moderate calcification of the aortic valve. Aortic valve regurgitation is mild by color flow Doppler. Moderate stenosis of the aortic valve. LVOT/AV VTI 0.37 and AVA approximately 1.2 cm2. Mild aortic  annular calcification noted.  4. The mitral valve is degenerative. Mild thickening of the mitral valve leaflet. There is moderate mitral annular calcification present.  5. The tricuspid valve is grossly normal.   09/2019 echo 1. Left ventricular ejection fraction, by estimation, is 60 to 65%. The  left ventricle has normal function. The left ventricle has no regional  wall motion abnormalities. There is severe concentric left ventricular  hypertrophy. Left ventricular diastolic   parameters are consistent with Grade I diastolic dysfunction (impaired  relaxation). Elevated left ventricular end-diastolic pressure.   2. Right ventricular systolic function is low normal. The right  ventricular size is normal.   3. The mitral valve is degenerative. No evidence of mitral valve  regurgitation. Mild mitral stenosis.   4. Aortic valve leaflets were poorly visualized. Overall, aortic stenosis  appeared moderate in severity.. The aortic valve  has an indeterminant  number of cusps. Aortic valve regurgitation is mild. Moderate aortic valve  stenosis.      12/2020 echo IMPRESSIONS     1. Hypokinesis of the inferolateral wall (base, mid), inferior wall  (base).. Left ventricular ejection fraction, by estimation, is 50 to 55%.  The left ventricle has low normal function. The left ventricle has no  regional wall motion abnormalities. Left  ventricular diastolic parameters are consistent with Grade II diastolic  dysfunction (pseudonormalization). Elevated left atrial pressure.   2. Right ventricular systolic function is normal. The right ventricular  size is normal.   3. The mitral valve is abnormal. Trivial mitral valve regurgitation.   4. AV is not well visualized. Peak and mean gradients through the valve  are 38 and 22 mm Hg respectively Dimensionless index is 0.37. Overall  consistent with moderate AS. Compared to echo from 2021 Mean gradient is  increased (14 to 22 mm now) and  dimensionless index is decreased. . The aortic valve is abnormal. Aortic  valve regurgitation is mild to moderate.   5. The inferior vena cava is normal in size with greater than 50%  respiratory variability, suggesting right atrial pressure of 3 mmHg   01/2022 echo 1. Left ventricular ejection fraction, by estimation, is 55%. The left  ventricle has normal function. The left ventricle demonstrates regional  wall motion abnormalities (see scoring diagram/findings for description).  There is mild asymmetric left  ventricular hypertrophy of the septal segment. Left ventricular diastolic  parameters are consistent with Grade I diastolic dysfunction (impaired  relaxation). The average left ventricular global longitudinal strain is  -13.2 %. The global longitudinal  strain is abnormal.   2. RA-RV gradient 8 mmHg suggesting normal RVSP. Right ventricular  systolic function is normal. The right ventricular size is normal.   3. The mitral valve is  degenerative. Trivial mitral valve regurgitation.  Moderate mitral annular calcification.   4. The aortic valve is bicuspid. There is moderate calcification of the  aortic valve. Aortic valve regurgitation is moderate. Moderate to severe  aortic valve stenosis. Aortic valve area, by VTI measures 1.11 cm. Aortic  valve mean gradient measures 24.0   mmHg. Dimentionless index 0.44.   5. Aortic dilatation noted. There is mild dilatation of the  aortic root,  measuring 42 mm. There is mild dilatation of the ascending aorta,  measuring 39 mm.   6. Unable to estimate CVP.    04/2023 echo 1. Left ventricular ejection fraction, by estimation, is 55 to 60%. The  left ventricle has normal function. Left ventricular endocardial border  not optimally defined to evaluate regional wall motion. There is mild left  ventricular hypertrophy. Left  ventricular diastolic parameters are consistent with Grade I diastolic  dysfunction (impaired relaxation). Elevated left atrial pressure.   2. Right ventricular systolic function is normal. The right ventricular  size is normal. Tricuspid regurgitation signal is inadequate for assessing  PA pressure.   3. The mitral valve was not well visualized. No evidence of mitral valve  regurgitation. Mild to moderate mitral stenosis. The mean mitral valve  gradient is 5.0 mmHg. Moderate mitral annular calcification.   4. Moderate to severe paradoxical low flow low gradient aortic stenosis.  Mean gradient 21 mmHg, AVA VTI 0.93, DI 0.30, SVI 23. The aortic valve was  not well visualized. There is severe calcifcation of the aortic valve.  There is severe thickening of the  aortic valve. Aortic valve regurgitation is moderate. Moderate to severe  aortic valve stenosis.   5. Aortic dilatation noted. There is mild dilatation of the aortic root,  measuring 42 mm. There is mild dilatation of the ascending aorta,  measuring 40 mm.    Assessment and Plan   1. Aortic  stenosis - moderate borderlien severe AS by recent echo, not to extent of clinical relevance -continue to monitor.    2. Chronic HFpEF - some ongoing fluid overload, we will start lasix 20mg  daily. Check bmet/mg/bnp in 2 weeks.    EKG today shows SR, LVH  Dorn PHEBE Ross, M.D.

## 2024-01-05 NOTE — Telephone Encounter (Signed)
 Pt requesting a pulse meter be sent to [facility] so the pts insurance can cover the cost.

## 2024-01-08 DIAGNOSIS — I509 Heart failure, unspecified: Secondary | ICD-10-CM | POA: Diagnosis not present

## 2024-01-08 DIAGNOSIS — J181 Lobar pneumonia, unspecified organism: Secondary | ICD-10-CM | POA: Diagnosis not present

## 2024-01-08 DIAGNOSIS — N189 Chronic kidney disease, unspecified: Secondary | ICD-10-CM | POA: Diagnosis not present

## 2024-01-08 MED ORDER — PULSE OXIMETER FOR FINGER MISC: 0 refills | Status: AC
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
## 2024-01-08 NOTE — Telephone Encounter (Signed)
 Spoke with EC, sent rx to [facility]

## 2024-01-09 DIAGNOSIS — Z7982 Long term (current) use of aspirin: Secondary | ICD-10-CM | POA: Diagnosis not present

## 2024-01-09 DIAGNOSIS — N189 Chronic kidney disease, unspecified: Secondary | ICD-10-CM | POA: Diagnosis not present

## 2024-01-09 DIAGNOSIS — I130 Hypertensive heart and chronic kidney disease with heart failure and stage 1 through stage 4 chronic kidney disease, or unspecified chronic kidney disease: Secondary | ICD-10-CM | POA: Diagnosis not present

## 2024-01-09 DIAGNOSIS — F17210 Nicotine dependence, cigarettes, uncomplicated: Secondary | ICD-10-CM | POA: Diagnosis not present

## 2024-01-09 DIAGNOSIS — E1122 Type 2 diabetes mellitus with diabetic chronic kidney disease: Secondary | ICD-10-CM | POA: Diagnosis not present

## 2024-01-09 DIAGNOSIS — J432 Centrilobular emphysema: Secondary | ICD-10-CM | POA: Diagnosis not present

## 2024-01-09 DIAGNOSIS — Z556 Problems related to health literacy: Secondary | ICD-10-CM | POA: Diagnosis not present

## 2024-01-09 DIAGNOSIS — Z7984 Long term (current) use of oral hypoglycemic drugs: Secondary | ICD-10-CM | POA: Diagnosis not present

## 2024-01-09 DIAGNOSIS — I509 Heart failure, unspecified: Secondary | ICD-10-CM | POA: Diagnosis not present

## 2024-01-09 DIAGNOSIS — Z9981 Dependence on supplemental oxygen: Secondary | ICD-10-CM | POA: Diagnosis not present

## 2024-01-09 DIAGNOSIS — Z9181 History of falling: Secondary | ICD-10-CM | POA: Diagnosis not present

## 2024-01-09 DIAGNOSIS — D0220 Carcinoma in situ of unspecified bronchus and lung: Secondary | ICD-10-CM | POA: Diagnosis not present

## 2024-01-09 DIAGNOSIS — Z5982 Transportation insecurity: Secondary | ICD-10-CM | POA: Diagnosis not present

## 2024-01-09 DIAGNOSIS — I080 Rheumatic disorders of both mitral and aortic valves: Secondary | ICD-10-CM | POA: Diagnosis not present

## 2024-01-10 DIAGNOSIS — Z7982 Long term (current) use of aspirin: Secondary | ICD-10-CM | POA: Diagnosis not present

## 2024-01-10 DIAGNOSIS — F17210 Nicotine dependence, cigarettes, uncomplicated: Secondary | ICD-10-CM | POA: Diagnosis not present

## 2024-01-10 DIAGNOSIS — Z556 Problems related to health literacy: Secondary | ICD-10-CM | POA: Diagnosis not present

## 2024-01-10 DIAGNOSIS — D0220 Carcinoma in situ of unspecified bronchus and lung: Secondary | ICD-10-CM | POA: Diagnosis not present

## 2024-01-10 DIAGNOSIS — Z9181 History of falling: Secondary | ICD-10-CM | POA: Diagnosis not present

## 2024-01-10 DIAGNOSIS — I130 Hypertensive heart and chronic kidney disease with heart failure and stage 1 through stage 4 chronic kidney disease, or unspecified chronic kidney disease: Secondary | ICD-10-CM | POA: Diagnosis not present

## 2024-01-10 DIAGNOSIS — E1122 Type 2 diabetes mellitus with diabetic chronic kidney disease: Secondary | ICD-10-CM | POA: Diagnosis not present

## 2024-01-10 DIAGNOSIS — J432 Centrilobular emphysema: Secondary | ICD-10-CM | POA: Diagnosis not present

## 2024-01-10 DIAGNOSIS — Z5982 Transportation insecurity: Secondary | ICD-10-CM | POA: Diagnosis not present

## 2024-01-10 DIAGNOSIS — N189 Chronic kidney disease, unspecified: Secondary | ICD-10-CM | POA: Diagnosis not present

## 2024-01-10 DIAGNOSIS — Z7984 Long term (current) use of oral hypoglycemic drugs: Secondary | ICD-10-CM | POA: Diagnosis not present

## 2024-01-10 DIAGNOSIS — Z9981 Dependence on supplemental oxygen: Secondary | ICD-10-CM | POA: Diagnosis not present

## 2024-01-10 DIAGNOSIS — I080 Rheumatic disorders of both mitral and aortic valves: Secondary | ICD-10-CM | POA: Diagnosis not present

## 2024-01-10 DIAGNOSIS — I509 Heart failure, unspecified: Secondary | ICD-10-CM | POA: Diagnosis not present

## 2024-01-11 DIAGNOSIS — N189 Chronic kidney disease, unspecified: Secondary | ICD-10-CM | POA: Diagnosis not present

## 2024-01-11 DIAGNOSIS — Z9181 History of falling: Secondary | ICD-10-CM | POA: Diagnosis not present

## 2024-01-11 DIAGNOSIS — Z556 Problems related to health literacy: Secondary | ICD-10-CM | POA: Diagnosis not present

## 2024-01-11 DIAGNOSIS — I130 Hypertensive heart and chronic kidney disease with heart failure and stage 1 through stage 4 chronic kidney disease, or unspecified chronic kidney disease: Secondary | ICD-10-CM | POA: Diagnosis not present

## 2024-01-11 DIAGNOSIS — Z5982 Transportation insecurity: Secondary | ICD-10-CM | POA: Diagnosis not present

## 2024-01-11 DIAGNOSIS — E1122 Type 2 diabetes mellitus with diabetic chronic kidney disease: Secondary | ICD-10-CM | POA: Diagnosis not present

## 2024-01-11 DIAGNOSIS — F17210 Nicotine dependence, cigarettes, uncomplicated: Secondary | ICD-10-CM | POA: Diagnosis not present

## 2024-01-11 DIAGNOSIS — D0220 Carcinoma in situ of unspecified bronchus and lung: Secondary | ICD-10-CM | POA: Diagnosis not present

## 2024-01-11 DIAGNOSIS — I080 Rheumatic disorders of both mitral and aortic valves: Secondary | ICD-10-CM | POA: Diagnosis not present

## 2024-01-11 DIAGNOSIS — Z7982 Long term (current) use of aspirin: Secondary | ICD-10-CM | POA: Diagnosis not present

## 2024-01-11 DIAGNOSIS — I509 Heart failure, unspecified: Secondary | ICD-10-CM | POA: Diagnosis not present

## 2024-01-11 DIAGNOSIS — Z7984 Long term (current) use of oral hypoglycemic drugs: Secondary | ICD-10-CM | POA: Diagnosis not present

## 2024-01-11 DIAGNOSIS — J432 Centrilobular emphysema: Secondary | ICD-10-CM | POA: Diagnosis not present

## 2024-01-11 DIAGNOSIS — Z9981 Dependence on supplemental oxygen: Secondary | ICD-10-CM | POA: Diagnosis not present

## 2024-01-12 DIAGNOSIS — Z9181 History of falling: Secondary | ICD-10-CM | POA: Diagnosis not present

## 2024-01-12 DIAGNOSIS — Z9981 Dependence on supplemental oxygen: Secondary | ICD-10-CM | POA: Diagnosis not present

## 2024-01-12 DIAGNOSIS — F17210 Nicotine dependence, cigarettes, uncomplicated: Secondary | ICD-10-CM | POA: Diagnosis not present

## 2024-01-12 DIAGNOSIS — Z7984 Long term (current) use of oral hypoglycemic drugs: Secondary | ICD-10-CM | POA: Diagnosis not present

## 2024-01-12 DIAGNOSIS — J432 Centrilobular emphysema: Secondary | ICD-10-CM | POA: Diagnosis not present

## 2024-01-12 DIAGNOSIS — I080 Rheumatic disorders of both mitral and aortic valves: Secondary | ICD-10-CM | POA: Diagnosis not present

## 2024-01-12 DIAGNOSIS — I509 Heart failure, unspecified: Secondary | ICD-10-CM | POA: Diagnosis not present

## 2024-01-12 DIAGNOSIS — E1122 Type 2 diabetes mellitus with diabetic chronic kidney disease: Secondary | ICD-10-CM | POA: Diagnosis not present

## 2024-01-12 DIAGNOSIS — Z7982 Long term (current) use of aspirin: Secondary | ICD-10-CM | POA: Diagnosis not present

## 2024-01-12 DIAGNOSIS — Z556 Problems related to health literacy: Secondary | ICD-10-CM | POA: Diagnosis not present

## 2024-01-12 DIAGNOSIS — D0220 Carcinoma in situ of unspecified bronchus and lung: Secondary | ICD-10-CM | POA: Diagnosis not present

## 2024-01-12 DIAGNOSIS — N189 Chronic kidney disease, unspecified: Secondary | ICD-10-CM | POA: Diagnosis not present

## 2024-01-12 DIAGNOSIS — Z5982 Transportation insecurity: Secondary | ICD-10-CM | POA: Diagnosis not present

## 2024-01-12 DIAGNOSIS — I130 Hypertensive heart and chronic kidney disease with heart failure and stage 1 through stage 4 chronic kidney disease, or unspecified chronic kidney disease: Secondary | ICD-10-CM | POA: Diagnosis not present

## 2024-01-15 DIAGNOSIS — J432 Centrilobular emphysema: Secondary | ICD-10-CM | POA: Diagnosis not present

## 2024-01-15 DIAGNOSIS — Z9181 History of falling: Secondary | ICD-10-CM | POA: Diagnosis not present

## 2024-01-15 DIAGNOSIS — F17210 Nicotine dependence, cigarettes, uncomplicated: Secondary | ICD-10-CM | POA: Diagnosis not present

## 2024-01-15 DIAGNOSIS — E1122 Type 2 diabetes mellitus with diabetic chronic kidney disease: Secondary | ICD-10-CM | POA: Diagnosis not present

## 2024-01-15 DIAGNOSIS — I080 Rheumatic disorders of both mitral and aortic valves: Secondary | ICD-10-CM | POA: Diagnosis not present

## 2024-01-15 DIAGNOSIS — N189 Chronic kidney disease, unspecified: Secondary | ICD-10-CM | POA: Diagnosis not present

## 2024-01-15 DIAGNOSIS — Z5982 Transportation insecurity: Secondary | ICD-10-CM | POA: Diagnosis not present

## 2024-01-15 DIAGNOSIS — Z7982 Long term (current) use of aspirin: Secondary | ICD-10-CM | POA: Diagnosis not present

## 2024-01-15 DIAGNOSIS — Z556 Problems related to health literacy: Secondary | ICD-10-CM | POA: Diagnosis not present

## 2024-01-15 DIAGNOSIS — I509 Heart failure, unspecified: Secondary | ICD-10-CM | POA: Diagnosis not present

## 2024-01-15 DIAGNOSIS — Z9981 Dependence on supplemental oxygen: Secondary | ICD-10-CM | POA: Diagnosis not present

## 2024-01-15 DIAGNOSIS — D0220 Carcinoma in situ of unspecified bronchus and lung: Secondary | ICD-10-CM | POA: Diagnosis not present

## 2024-01-15 DIAGNOSIS — I130 Hypertensive heart and chronic kidney disease with heart failure and stage 1 through stage 4 chronic kidney disease, or unspecified chronic kidney disease: Secondary | ICD-10-CM | POA: Diagnosis not present

## 2024-01-15 DIAGNOSIS — Z7984 Long term (current) use of oral hypoglycemic drugs: Secondary | ICD-10-CM | POA: Diagnosis not present

## 2024-01-16 DIAGNOSIS — Z9181 History of falling: Secondary | ICD-10-CM | POA: Diagnosis not present

## 2024-01-16 DIAGNOSIS — I080 Rheumatic disorders of both mitral and aortic valves: Secondary | ICD-10-CM | POA: Diagnosis not present

## 2024-01-16 DIAGNOSIS — Z7984 Long term (current) use of oral hypoglycemic drugs: Secondary | ICD-10-CM | POA: Diagnosis not present

## 2024-01-16 DIAGNOSIS — J432 Centrilobular emphysema: Secondary | ICD-10-CM | POA: Diagnosis not present

## 2024-01-16 DIAGNOSIS — I509 Heart failure, unspecified: Secondary | ICD-10-CM | POA: Diagnosis not present

## 2024-01-16 DIAGNOSIS — I130 Hypertensive heart and chronic kidney disease with heart failure and stage 1 through stage 4 chronic kidney disease, or unspecified chronic kidney disease: Secondary | ICD-10-CM | POA: Diagnosis not present

## 2024-01-16 DIAGNOSIS — D0220 Carcinoma in situ of unspecified bronchus and lung: Secondary | ICD-10-CM | POA: Diagnosis not present

## 2024-01-16 DIAGNOSIS — F17210 Nicotine dependence, cigarettes, uncomplicated: Secondary | ICD-10-CM | POA: Diagnosis not present

## 2024-01-16 DIAGNOSIS — Z5982 Transportation insecurity: Secondary | ICD-10-CM | POA: Diagnosis not present

## 2024-01-16 DIAGNOSIS — Z556 Problems related to health literacy: Secondary | ICD-10-CM | POA: Diagnosis not present

## 2024-01-16 DIAGNOSIS — Z7982 Long term (current) use of aspirin: Secondary | ICD-10-CM | POA: Diagnosis not present

## 2024-01-16 DIAGNOSIS — N189 Chronic kidney disease, unspecified: Secondary | ICD-10-CM | POA: Diagnosis not present

## 2024-01-16 DIAGNOSIS — E1122 Type 2 diabetes mellitus with diabetic chronic kidney disease: Secondary | ICD-10-CM | POA: Diagnosis not present

## 2024-01-16 DIAGNOSIS — Z9981 Dependence on supplemental oxygen: Secondary | ICD-10-CM | POA: Diagnosis not present

## 2024-01-17 DIAGNOSIS — Z5982 Transportation insecurity: Secondary | ICD-10-CM | POA: Diagnosis not present

## 2024-01-17 DIAGNOSIS — Z7984 Long term (current) use of oral hypoglycemic drugs: Secondary | ICD-10-CM | POA: Diagnosis not present

## 2024-01-17 DIAGNOSIS — Z9981 Dependence on supplemental oxygen: Secondary | ICD-10-CM | POA: Diagnosis not present

## 2024-01-17 DIAGNOSIS — E1122 Type 2 diabetes mellitus with diabetic chronic kidney disease: Secondary | ICD-10-CM | POA: Diagnosis not present

## 2024-01-17 DIAGNOSIS — Z556 Problems related to health literacy: Secondary | ICD-10-CM | POA: Diagnosis not present

## 2024-01-17 DIAGNOSIS — N189 Chronic kidney disease, unspecified: Secondary | ICD-10-CM | POA: Diagnosis not present

## 2024-01-17 DIAGNOSIS — I080 Rheumatic disorders of both mitral and aortic valves: Secondary | ICD-10-CM | POA: Diagnosis not present

## 2024-01-17 DIAGNOSIS — Z7982 Long term (current) use of aspirin: Secondary | ICD-10-CM | POA: Diagnosis not present

## 2024-01-17 DIAGNOSIS — Z9181 History of falling: Secondary | ICD-10-CM | POA: Diagnosis not present

## 2024-01-17 DIAGNOSIS — I130 Hypertensive heart and chronic kidney disease with heart failure and stage 1 through stage 4 chronic kidney disease, or unspecified chronic kidney disease: Secondary | ICD-10-CM | POA: Diagnosis not present

## 2024-01-17 DIAGNOSIS — D0220 Carcinoma in situ of unspecified bronchus and lung: Secondary | ICD-10-CM | POA: Diagnosis not present

## 2024-01-17 DIAGNOSIS — I509 Heart failure, unspecified: Secondary | ICD-10-CM | POA: Diagnosis not present

## 2024-01-17 DIAGNOSIS — F17210 Nicotine dependence, cigarettes, uncomplicated: Secondary | ICD-10-CM | POA: Diagnosis not present

## 2024-01-17 DIAGNOSIS — J432 Centrilobular emphysema: Secondary | ICD-10-CM | POA: Diagnosis not present

## 2024-01-18 DIAGNOSIS — H26491 Other secondary cataract, right eye: Secondary | ICD-10-CM | POA: Diagnosis not present

## 2024-01-18 DIAGNOSIS — Z961 Presence of intraocular lens: Secondary | ICD-10-CM | POA: Diagnosis not present

## 2024-01-18 DIAGNOSIS — H40013 Open angle with borderline findings, low risk, bilateral: Secondary | ICD-10-CM | POA: Diagnosis not present

## 2024-01-19 DIAGNOSIS — E1122 Type 2 diabetes mellitus with diabetic chronic kidney disease: Secondary | ICD-10-CM | POA: Diagnosis not present

## 2024-01-19 DIAGNOSIS — Z7982 Long term (current) use of aspirin: Secondary | ICD-10-CM | POA: Diagnosis not present

## 2024-01-19 DIAGNOSIS — I509 Heart failure, unspecified: Secondary | ICD-10-CM | POA: Diagnosis not present

## 2024-01-19 DIAGNOSIS — Z9981 Dependence on supplemental oxygen: Secondary | ICD-10-CM | POA: Diagnosis not present

## 2024-01-19 DIAGNOSIS — Z9181 History of falling: Secondary | ICD-10-CM | POA: Diagnosis not present

## 2024-01-19 DIAGNOSIS — F17210 Nicotine dependence, cigarettes, uncomplicated: Secondary | ICD-10-CM | POA: Diagnosis not present

## 2024-01-19 DIAGNOSIS — N189 Chronic kidney disease, unspecified: Secondary | ICD-10-CM | POA: Diagnosis not present

## 2024-01-19 DIAGNOSIS — Z7984 Long term (current) use of oral hypoglycemic drugs: Secondary | ICD-10-CM | POA: Diagnosis not present

## 2024-01-19 DIAGNOSIS — J432 Centrilobular emphysema: Secondary | ICD-10-CM | POA: Diagnosis not present

## 2024-01-19 DIAGNOSIS — I130 Hypertensive heart and chronic kidney disease with heart failure and stage 1 through stage 4 chronic kidney disease, or unspecified chronic kidney disease: Secondary | ICD-10-CM | POA: Diagnosis not present

## 2024-01-19 DIAGNOSIS — Z5982 Transportation insecurity: Secondary | ICD-10-CM | POA: Diagnosis not present

## 2024-01-19 DIAGNOSIS — D0220 Carcinoma in situ of unspecified bronchus and lung: Secondary | ICD-10-CM | POA: Diagnosis not present

## 2024-01-19 DIAGNOSIS — I080 Rheumatic disorders of both mitral and aortic valves: Secondary | ICD-10-CM | POA: Diagnosis not present

## 2024-01-19 DIAGNOSIS — Z556 Problems related to health literacy: Secondary | ICD-10-CM | POA: Diagnosis not present

## 2024-01-22 DIAGNOSIS — N189 Chronic kidney disease, unspecified: Secondary | ICD-10-CM | POA: Diagnosis not present

## 2024-01-22 DIAGNOSIS — I130 Hypertensive heart and chronic kidney disease with heart failure and stage 1 through stage 4 chronic kidney disease, or unspecified chronic kidney disease: Secondary | ICD-10-CM | POA: Diagnosis not present

## 2024-01-22 DIAGNOSIS — Z9981 Dependence on supplemental oxygen: Secondary | ICD-10-CM | POA: Diagnosis not present

## 2024-01-22 DIAGNOSIS — F17210 Nicotine dependence, cigarettes, uncomplicated: Secondary | ICD-10-CM | POA: Diagnosis not present

## 2024-01-22 DIAGNOSIS — Z9181 History of falling: Secondary | ICD-10-CM | POA: Diagnosis not present

## 2024-01-22 DIAGNOSIS — Z556 Problems related to health literacy: Secondary | ICD-10-CM | POA: Diagnosis not present

## 2024-01-22 DIAGNOSIS — I080 Rheumatic disorders of both mitral and aortic valves: Secondary | ICD-10-CM | POA: Diagnosis not present

## 2024-01-22 DIAGNOSIS — D0220 Carcinoma in situ of unspecified bronchus and lung: Secondary | ICD-10-CM | POA: Diagnosis not present

## 2024-01-22 DIAGNOSIS — Z5982 Transportation insecurity: Secondary | ICD-10-CM | POA: Diagnosis not present

## 2024-01-22 DIAGNOSIS — Z7982 Long term (current) use of aspirin: Secondary | ICD-10-CM | POA: Diagnosis not present

## 2024-01-22 DIAGNOSIS — J432 Centrilobular emphysema: Secondary | ICD-10-CM | POA: Diagnosis not present

## 2024-01-22 DIAGNOSIS — Z7984 Long term (current) use of oral hypoglycemic drugs: Secondary | ICD-10-CM | POA: Diagnosis not present

## 2024-01-22 DIAGNOSIS — E1122 Type 2 diabetes mellitus with diabetic chronic kidney disease: Secondary | ICD-10-CM | POA: Diagnosis not present

## 2024-01-22 DIAGNOSIS — I509 Heart failure, unspecified: Secondary | ICD-10-CM | POA: Diagnosis not present

## 2024-01-23 DIAGNOSIS — Z556 Problems related to health literacy: Secondary | ICD-10-CM | POA: Diagnosis not present

## 2024-01-23 DIAGNOSIS — Z9181 History of falling: Secondary | ICD-10-CM | POA: Diagnosis not present

## 2024-01-23 DIAGNOSIS — I130 Hypertensive heart and chronic kidney disease with heart failure and stage 1 through stage 4 chronic kidney disease, or unspecified chronic kidney disease: Secondary | ICD-10-CM | POA: Diagnosis not present

## 2024-01-23 DIAGNOSIS — F17210 Nicotine dependence, cigarettes, uncomplicated: Secondary | ICD-10-CM | POA: Diagnosis not present

## 2024-01-23 DIAGNOSIS — N189 Chronic kidney disease, unspecified: Secondary | ICD-10-CM | POA: Diagnosis not present

## 2024-01-23 DIAGNOSIS — I509 Heart failure, unspecified: Secondary | ICD-10-CM | POA: Diagnosis not present

## 2024-01-23 DIAGNOSIS — Z5982 Transportation insecurity: Secondary | ICD-10-CM | POA: Diagnosis not present

## 2024-01-23 DIAGNOSIS — Z7984 Long term (current) use of oral hypoglycemic drugs: Secondary | ICD-10-CM | POA: Diagnosis not present

## 2024-01-23 DIAGNOSIS — I080 Rheumatic disorders of both mitral and aortic valves: Secondary | ICD-10-CM | POA: Diagnosis not present

## 2024-01-23 DIAGNOSIS — D0220 Carcinoma in situ of unspecified bronchus and lung: Secondary | ICD-10-CM | POA: Diagnosis not present

## 2024-01-23 DIAGNOSIS — E1122 Type 2 diabetes mellitus with diabetic chronic kidney disease: Secondary | ICD-10-CM | POA: Diagnosis not present

## 2024-01-23 DIAGNOSIS — J432 Centrilobular emphysema: Secondary | ICD-10-CM | POA: Diagnosis not present

## 2024-01-23 DIAGNOSIS — Z7982 Long term (current) use of aspirin: Secondary | ICD-10-CM | POA: Diagnosis not present

## 2024-01-23 DIAGNOSIS — Z9981 Dependence on supplemental oxygen: Secondary | ICD-10-CM | POA: Diagnosis not present

## 2024-01-24 DIAGNOSIS — Z7982 Long term (current) use of aspirin: Secondary | ICD-10-CM | POA: Diagnosis not present

## 2024-01-24 DIAGNOSIS — I130 Hypertensive heart and chronic kidney disease with heart failure and stage 1 through stage 4 chronic kidney disease, or unspecified chronic kidney disease: Secondary | ICD-10-CM | POA: Diagnosis not present

## 2024-01-24 DIAGNOSIS — N189 Chronic kidney disease, unspecified: Secondary | ICD-10-CM | POA: Diagnosis not present

## 2024-01-24 DIAGNOSIS — Z556 Problems related to health literacy: Secondary | ICD-10-CM | POA: Diagnosis not present

## 2024-01-24 DIAGNOSIS — J432 Centrilobular emphysema: Secondary | ICD-10-CM | POA: Diagnosis not present

## 2024-01-24 DIAGNOSIS — Z9981 Dependence on supplemental oxygen: Secondary | ICD-10-CM | POA: Diagnosis not present

## 2024-01-24 DIAGNOSIS — F17210 Nicotine dependence, cigarettes, uncomplicated: Secondary | ICD-10-CM | POA: Diagnosis not present

## 2024-01-24 DIAGNOSIS — I509 Heart failure, unspecified: Secondary | ICD-10-CM | POA: Diagnosis not present

## 2024-01-24 DIAGNOSIS — Z5982 Transportation insecurity: Secondary | ICD-10-CM | POA: Diagnosis not present

## 2024-01-24 DIAGNOSIS — D0220 Carcinoma in situ of unspecified bronchus and lung: Secondary | ICD-10-CM | POA: Diagnosis not present

## 2024-01-24 DIAGNOSIS — Z9181 History of falling: Secondary | ICD-10-CM | POA: Diagnosis not present

## 2024-01-24 DIAGNOSIS — I080 Rheumatic disorders of both mitral and aortic valves: Secondary | ICD-10-CM | POA: Diagnosis not present

## 2024-01-24 DIAGNOSIS — Z7984 Long term (current) use of oral hypoglycemic drugs: Secondary | ICD-10-CM | POA: Diagnosis not present

## 2024-01-24 DIAGNOSIS — E1122 Type 2 diabetes mellitus with diabetic chronic kidney disease: Secondary | ICD-10-CM | POA: Diagnosis not present

## 2024-01-26 DIAGNOSIS — I129 Hypertensive chronic kidney disease with stage 1 through stage 4 chronic kidney disease, or unspecified chronic kidney disease: Secondary | ICD-10-CM | POA: Diagnosis not present

## 2024-01-26 DIAGNOSIS — J430 Unilateral pulmonary emphysema [MacLeod's syndrome]: Secondary | ICD-10-CM | POA: Diagnosis not present

## 2024-01-26 DIAGNOSIS — J449 Chronic obstructive pulmonary disease, unspecified: Secondary | ICD-10-CM | POA: Diagnosis not present

## 2024-01-26 DIAGNOSIS — J9601 Acute respiratory failure with hypoxia: Secondary | ICD-10-CM | POA: Diagnosis not present

## 2024-01-30 DIAGNOSIS — F17210 Nicotine dependence, cigarettes, uncomplicated: Secondary | ICD-10-CM | POA: Diagnosis not present

## 2024-01-30 DIAGNOSIS — E1122 Type 2 diabetes mellitus with diabetic chronic kidney disease: Secondary | ICD-10-CM | POA: Diagnosis not present

## 2024-01-30 DIAGNOSIS — Z7984 Long term (current) use of oral hypoglycemic drugs: Secondary | ICD-10-CM | POA: Diagnosis not present

## 2024-01-30 DIAGNOSIS — Z9181 History of falling: Secondary | ICD-10-CM | POA: Diagnosis not present

## 2024-01-30 DIAGNOSIS — Z556 Problems related to health literacy: Secondary | ICD-10-CM | POA: Diagnosis not present

## 2024-01-30 DIAGNOSIS — Z9981 Dependence on supplemental oxygen: Secondary | ICD-10-CM | POA: Diagnosis not present

## 2024-01-30 DIAGNOSIS — Z5982 Transportation insecurity: Secondary | ICD-10-CM | POA: Diagnosis not present

## 2024-01-30 DIAGNOSIS — I509 Heart failure, unspecified: Secondary | ICD-10-CM | POA: Diagnosis not present

## 2024-01-30 DIAGNOSIS — I130 Hypertensive heart and chronic kidney disease with heart failure and stage 1 through stage 4 chronic kidney disease, or unspecified chronic kidney disease: Secondary | ICD-10-CM | POA: Diagnosis not present

## 2024-01-30 DIAGNOSIS — N189 Chronic kidney disease, unspecified: Secondary | ICD-10-CM | POA: Diagnosis not present

## 2024-01-30 DIAGNOSIS — I080 Rheumatic disorders of both mitral and aortic valves: Secondary | ICD-10-CM | POA: Diagnosis not present

## 2024-01-30 DIAGNOSIS — J432 Centrilobular emphysema: Secondary | ICD-10-CM | POA: Diagnosis not present

## 2024-01-30 DIAGNOSIS — Z7982 Long term (current) use of aspirin: Secondary | ICD-10-CM | POA: Diagnosis not present

## 2024-01-30 DIAGNOSIS — D0220 Carcinoma in situ of unspecified bronchus and lung: Secondary | ICD-10-CM | POA: Diagnosis not present

## 2024-02-01 ENCOUNTER — Encounter (HOSPITAL_COMMUNITY): Payer: Self-pay

## 2024-02-01 ENCOUNTER — Inpatient Hospital Stay (HOSPITAL_COMMUNITY)
Admission: EM | Admit: 2024-02-01 | Discharge: 2024-02-06 | DRG: 377 | Disposition: A | Discharge status: Skilled Nursing Facility | Attending: Internal Medicine | Admitting: Internal Medicine

## 2024-02-01 ENCOUNTER — Other Ambulatory Visit: Payer: Self-pay

## 2024-02-01 ENCOUNTER — Emergency Department (HOSPITAL_COMMUNITY)

## 2024-02-01 DIAGNOSIS — N1832 Chronic kidney disease, stage 3b: Secondary | ICD-10-CM | POA: Diagnosis not present

## 2024-02-01 DIAGNOSIS — Z743 Need for continuous supervision: Secondary | ICD-10-CM | POA: Diagnosis not present

## 2024-02-01 DIAGNOSIS — I824Z2 Acute embolism and thrombosis of unspecified deep veins of left distal lower extremity: Secondary | ICD-10-CM | POA: Diagnosis not present

## 2024-02-01 DIAGNOSIS — J441 Chronic obstructive pulmonary disease with (acute) exacerbation: Secondary | ICD-10-CM | POA: Diagnosis present

## 2024-02-01 DIAGNOSIS — K921 Melena: Principal | ICD-10-CM | POA: Diagnosis present

## 2024-02-01 DIAGNOSIS — I739 Peripheral vascular disease, unspecified: Secondary | ICD-10-CM | POA: Diagnosis not present

## 2024-02-01 DIAGNOSIS — I5030 Unspecified diastolic (congestive) heart failure: Secondary | ICD-10-CM | POA: Diagnosis not present

## 2024-02-01 DIAGNOSIS — Z8249 Family history of ischemic heart disease and other diseases of the circulatory system: Secondary | ICD-10-CM

## 2024-02-01 DIAGNOSIS — R739 Hyperglycemia, unspecified: Secondary | ICD-10-CM | POA: Diagnosis not present

## 2024-02-01 DIAGNOSIS — K922 Gastrointestinal hemorrhage, unspecified: Secondary | ICD-10-CM | POA: Diagnosis not present

## 2024-02-01 DIAGNOSIS — R41841 Cognitive communication deficit: Secondary | ICD-10-CM | POA: Diagnosis not present

## 2024-02-01 DIAGNOSIS — Z7951 Long term (current) use of inhaled steroids: Secondary | ICD-10-CM

## 2024-02-01 DIAGNOSIS — I130 Hypertensive heart and chronic kidney disease with heart failure and stage 1 through stage 4 chronic kidney disease, or unspecified chronic kidney disease: Secondary | ICD-10-CM | POA: Diagnosis present

## 2024-02-01 DIAGNOSIS — R1310 Dysphagia, unspecified: Secondary | ICD-10-CM | POA: Diagnosis not present

## 2024-02-01 DIAGNOSIS — I5033 Acute on chronic diastolic (congestive) heart failure: Secondary | ICD-10-CM | POA: Diagnosis present

## 2024-02-01 DIAGNOSIS — D509 Iron deficiency anemia, unspecified: Secondary | ICD-10-CM | POA: Diagnosis not present

## 2024-02-01 DIAGNOSIS — J439 Emphysema, unspecified: Secondary | ICD-10-CM | POA: Diagnosis present

## 2024-02-01 DIAGNOSIS — Z7982 Long term (current) use of aspirin: Secondary | ICD-10-CM | POA: Diagnosis not present

## 2024-02-01 DIAGNOSIS — E1165 Type 2 diabetes mellitus with hyperglycemia: Secondary | ICD-10-CM | POA: Diagnosis not present

## 2024-02-01 DIAGNOSIS — R069 Unspecified abnormalities of breathing: Secondary | ICD-10-CM | POA: Diagnosis not present

## 2024-02-01 DIAGNOSIS — R600 Localized edema: Secondary | ICD-10-CM | POA: Diagnosis not present

## 2024-02-01 DIAGNOSIS — M1990 Unspecified osteoarthritis, unspecified site: Secondary | ICD-10-CM | POA: Diagnosis not present

## 2024-02-01 DIAGNOSIS — K5900 Constipation, unspecified: Secondary | ICD-10-CM | POA: Diagnosis not present

## 2024-02-01 DIAGNOSIS — Z7984 Long term (current) use of oral hypoglycemic drugs: Secondary | ICD-10-CM | POA: Diagnosis not present

## 2024-02-01 DIAGNOSIS — E1140 Type 2 diabetes mellitus with diabetic neuropathy, unspecified: Secondary | ICD-10-CM | POA: Diagnosis not present

## 2024-02-01 DIAGNOSIS — E785 Hyperlipidemia, unspecified: Secondary | ICD-10-CM | POA: Diagnosis not present

## 2024-02-01 DIAGNOSIS — D62 Acute posthemorrhagic anemia: Secondary | ICD-10-CM | POA: Diagnosis not present

## 2024-02-01 DIAGNOSIS — F331 Major depressive disorder, recurrent, moderate: Secondary | ICD-10-CM | POA: Diagnosis not present

## 2024-02-01 DIAGNOSIS — F17210 Nicotine dependence, cigarettes, uncomplicated: Secondary | ICD-10-CM | POA: Diagnosis present

## 2024-02-01 DIAGNOSIS — I350 Nonrheumatic aortic (valve) stenosis: Secondary | ICD-10-CM | POA: Diagnosis not present

## 2024-02-01 DIAGNOSIS — R531 Weakness: Secondary | ICD-10-CM | POA: Diagnosis not present

## 2024-02-01 DIAGNOSIS — R195 Other fecal abnormalities: Secondary | ICD-10-CM | POA: Diagnosis not present

## 2024-02-01 DIAGNOSIS — Z794 Long term (current) use of insulin: Secondary | ICD-10-CM

## 2024-02-01 DIAGNOSIS — R112 Nausea with vomiting, unspecified: Secondary | ICD-10-CM | POA: Diagnosis not present

## 2024-02-01 DIAGNOSIS — Z9981 Dependence on supplemental oxygen: Secondary | ICD-10-CM

## 2024-02-01 DIAGNOSIS — R2681 Unsteadiness on feet: Secondary | ICD-10-CM | POA: Diagnosis not present

## 2024-02-01 DIAGNOSIS — K219 Gastro-esophageal reflux disease without esophagitis: Secondary | ICD-10-CM | POA: Diagnosis present

## 2024-02-01 DIAGNOSIS — E875 Hyperkalemia: Secondary | ICD-10-CM | POA: Diagnosis not present

## 2024-02-01 DIAGNOSIS — D649 Anemia, unspecified: Secondary | ICD-10-CM

## 2024-02-01 DIAGNOSIS — J449 Chronic obstructive pulmonary disease, unspecified: Secondary | ICD-10-CM

## 2024-02-01 DIAGNOSIS — J9621 Acute and chronic respiratory failure with hypoxia: Secondary | ICD-10-CM | POA: Diagnosis not present

## 2024-02-01 DIAGNOSIS — S299XXA Unspecified injury of thorax, initial encounter: Secondary | ICD-10-CM | POA: Diagnosis not present

## 2024-02-01 DIAGNOSIS — E1122 Type 2 diabetes mellitus with diabetic chronic kidney disease: Secondary | ICD-10-CM | POA: Diagnosis not present

## 2024-02-01 DIAGNOSIS — I10 Essential (primary) hypertension: Secondary | ICD-10-CM | POA: Diagnosis not present

## 2024-02-01 DIAGNOSIS — R918 Other nonspecific abnormal finding of lung field: Secondary | ICD-10-CM | POA: Diagnosis not present

## 2024-02-01 DIAGNOSIS — Z79899 Other long term (current) drug therapy: Secondary | ICD-10-CM | POA: Diagnosis not present

## 2024-02-01 DIAGNOSIS — K5791 Diverticulosis of intestine, part unspecified, without perforation or abscess with bleeding: Secondary | ICD-10-CM | POA: Diagnosis not present

## 2024-02-01 DIAGNOSIS — M6281 Muscle weakness (generalized): Secondary | ICD-10-CM | POA: Diagnosis not present

## 2024-02-01 DIAGNOSIS — J9611 Chronic respiratory failure with hypoxia: Secondary | ICD-10-CM | POA: Diagnosis not present

## 2024-02-01 DIAGNOSIS — R262 Difficulty in walking, not elsewhere classified: Secondary | ICD-10-CM | POA: Diagnosis not present

## 2024-02-01 DIAGNOSIS — E559 Vitamin D deficiency, unspecified: Secondary | ICD-10-CM | POA: Diagnosis not present

## 2024-02-01 HISTORY — DX: Hyperlipidemia, unspecified: E78.5

## 2024-02-01 HISTORY — DX: Heart failure, unspecified: I50.9

## 2024-02-01 HISTORY — DX: Chronic kidney disease, unspecified: N18.9

## 2024-02-01 HISTORY — DX: Nonrheumatic aortic (valve) stenosis: I35.0

## 2024-02-01 HISTORY — DX: Other nonspecific abnormal finding of lung field: R91.8

## 2024-02-01 HISTORY — DX: Chronic obstructive pulmonary disease, unspecified: J44.9

## 2024-02-01 LAB — CBC WITH DIFFERENTIAL/PLATELET
Abs Immature Granulocytes: 0.06 K/uL (ref 0.00–0.07)
Basophils Absolute: 0 K/uL (ref 0.0–0.1)
Basophils Relative: 0 %
Eosinophils Absolute: 0.2 K/uL (ref 0.0–0.5)
Eosinophils Relative: 3 %
HCT: 27.1 % — ABNORMAL LOW (ref 39.0–52.0)
Hemoglobin: 8.5 g/dL — ABNORMAL LOW (ref 13.0–17.0)
Immature Granulocytes: 1 %
Lymphocytes Relative: 13 %
Lymphs Abs: 0.9 K/uL (ref 0.7–4.0)
MCH: 25.3 pg — ABNORMAL LOW (ref 26.0–34.0)
MCHC: 31.4 g/dL (ref 30.0–36.0)
MCV: 80.7 fL (ref 80.0–100.0)
Monocytes Absolute: 1 K/uL (ref 0.1–1.0)
Monocytes Relative: 15 %
Neutro Abs: 4.6 K/uL (ref 1.7–7.7)
Neutrophils Relative %: 68 %
Platelets: 190 K/uL (ref 150–400)
RBC: 3.36 MIL/uL — ABNORMAL LOW (ref 4.22–5.81)
RDW: 17.3 % — ABNORMAL HIGH (ref 11.5–15.5)
WBC: 6.8 K/uL (ref 4.0–10.5)
nRBC: 0 % (ref 0.0–0.2)

## 2024-02-01 LAB — COMPREHENSIVE METABOLIC PANEL WITH GFR
ALT: 27 U/L (ref 0–44)
AST: 28 U/L (ref 15–41)
Albumin: 3.5 g/dL (ref 3.5–5.0)
Alkaline Phosphatase: 96 U/L (ref 38–126)
Anion gap: 12 (ref 5–15)
BUN: 44 mg/dL — ABNORMAL HIGH (ref 8–23)
CO2: 22 mmol/L (ref 22–32)
Calcium: 8.2 mg/dL — ABNORMAL LOW (ref 8.9–10.3)
Chloride: 95 mmol/L — ABNORMAL LOW (ref 98–111)
Creatinine, Ser: 2.34 mg/dL — ABNORMAL HIGH (ref 0.61–1.24)
GFR, Estimated: 27 mL/min — ABNORMAL LOW (ref >60)
Glucose, Bld: 464 mg/dL — ABNORMAL HIGH (ref 70–99)
Potassium: 5 mmol/L (ref 3.5–5.1)
Sodium: 129 mmol/L — ABNORMAL LOW (ref 135–145)
Total Bilirubin: 0.4 mg/dL (ref 0.0–1.2)
Total Protein: 7 g/dL (ref 6.5–8.1)

## 2024-02-01 LAB — PROTIME-INR
INR: 1 (ref 0.8–1.2)
Prothrombin Time: 14.2 s (ref 11.4–15.2)

## 2024-02-01 LAB — HEMOGLOBIN AND HEMATOCRIT, BLOOD
HCT: 26.8 % — ABNORMAL LOW (ref 39.0–52.0)
Hemoglobin: 8.3 g/dL — ABNORMAL LOW (ref 13.0–17.0)

## 2024-02-01 LAB — BRAIN NATRIURETIC PEPTIDE: B Natriuretic Peptide: 1525 pg/mL — ABNORMAL HIGH (ref 0.0–100.0)

## 2024-02-01 LAB — CBG MONITORING, ED
Glucose-Capillary: 396 mg/dL — ABNORMAL HIGH (ref 70–99)
Glucose-Capillary: 297 mg/dL — ABNORMAL HIGH (ref 70–99)
Glucose-Capillary: 269 mg/dL — ABNORMAL HIGH (ref 70–99)

## 2024-02-01 LAB — POC OCCULT BLOOD, ED: Fecal Occult Blood: POSITIVE

## 2024-02-01 MED ORDER — INSULIN ASPART 100 UNIT/ML IJ SOLN
0.0000 [IU] | Freq: Three times a day (TID) | INTRAMUSCULAR | Status: DC
  Administered 2024-02-02 – 2024-02-03 (×4): 9 [IU] via SUBCUTANEOUS
  Administered 2024-02-03 – 2024-02-04 (×3): 7 [IU] via SUBCUTANEOUS
  Administered 2024-02-04: 9 [IU] via SUBCUTANEOUS
  Administered 2024-02-04: 7 [IU] via SUBCUTANEOUS
  Administered 2024-02-05: 2 [IU] via SUBCUTANEOUS
  Administered 2024-02-05: 3 [IU] via SUBCUTANEOUS
  Administered 2024-02-05: 5 [IU] via SUBCUTANEOUS
  Administered 2024-02-06: 7 [IU] via SUBCUTANEOUS

## 2024-02-01 MED ORDER — FUROSEMIDE 10 MG/ML IJ SOLN
20.0000 mg | Freq: Once | INTRAMUSCULAR | Status: AC
  Administered 2024-02-01: 20 mg via INTRAVENOUS
  Filled 2024-02-01: qty 2

## 2024-02-01 MED ORDER — EZETIMIBE 10 MG PO TABS
10.0000 mg | ORAL_TABLET | Freq: Every day | ORAL | Status: DC
  Administered 2024-02-02 – 2024-02-06 (×4): 10 mg via ORAL
  Filled 2024-02-01 (×4): qty 1

## 2024-02-01 MED ORDER — GABAPENTIN 100 MG PO CAPS
100.0000 mg | ORAL_CAPSULE | Freq: Two times a day (BID) | ORAL | Status: DC
  Administered 2024-02-01 – 2024-02-06 (×9): 100 mg via ORAL
  Filled 2024-02-01 (×9): qty 1

## 2024-02-01 MED ORDER — ACETAMINOPHEN 325 MG PO TABS
650.0000 mg | ORAL_TABLET | Freq: Four times a day (QID) | ORAL | Status: DC | PRN
Start: 2024-02-01 — End: 2024-02-06
  Administered 2024-02-06: 650 mg via ORAL
  Filled 2024-02-01: qty 2

## 2024-02-01 MED ORDER — PANTOPRAZOLE SODIUM 40 MG IV SOLR
40.0000 mg | Freq: Two times a day (BID) | INTRAVENOUS | Status: DC
  Administered 2024-02-02 – 2024-02-06 (×9): 40 mg via INTRAVENOUS
  Filled 2024-02-01 (×9): qty 10

## 2024-02-01 MED ORDER — MIRTAZAPINE 15 MG PO TABS
15.0000 mg | ORAL_TABLET | Freq: Every day | ORAL | Status: DC
  Administered 2024-02-01 – 2024-02-05 (×5): 15 mg via ORAL
  Filled 2024-02-01 (×5): qty 1

## 2024-02-01 MED ORDER — ONDANSETRON HCL 4 MG/2ML IJ SOLN
4.0000 mg | Freq: Four times a day (QID) | INTRAMUSCULAR | Status: DC | PRN
Start: 2024-02-01 — End: 2024-02-06
  Administered 2024-02-03 – 2024-02-04 (×2): 4 mg via INTRAVENOUS
  Filled 2024-02-01 (×2): qty 2

## 2024-02-01 MED ORDER — IPRATROPIUM-ALBUTEROL 0.5-2.5 (3) MG/3ML IN SOLN
3.0000 mL | Freq: Four times a day (QID) | RESPIRATORY_TRACT | Status: DC
  Administered 2024-02-01 – 2024-02-04 (×13): 3 mL via RESPIRATORY_TRACT
  Filled 2024-02-01 (×11): qty 3

## 2024-02-01 MED ORDER — ACETAMINOPHEN 650 MG RE SUPP: 650.0000 mg | Freq: Four times a day (QID) | RECTAL | Status: DC | PRN

## 2024-02-01 MED ORDER — UMECLIDINIUM-VILANTEROL 62.5-25 MCG/ACT IN AEPB
1.0000 | INHALATION_SPRAY | Freq: Every day | RESPIRATORY_TRACT | Status: DC
  Administered 2024-02-02 – 2024-02-06 (×5): 1 via RESPIRATORY_TRACT
  Filled 2024-02-01: qty 14

## 2024-02-01 MED ORDER — INSULIN ASPART 100 UNIT/ML IJ SOLN
0.0000 [IU] | Freq: Every day | INTRAMUSCULAR | Status: DC
  Administered 2024-02-01: 3 [IU] via SUBCUTANEOUS
  Administered 2024-02-02: 5 [IU] via SUBCUTANEOUS
  Administered 2024-02-03: 4 [IU] via SUBCUTANEOUS
  Administered 2024-02-04: 3 [IU] via SUBCUTANEOUS
  Administered 2024-02-05: 2 [IU] via SUBCUTANEOUS
  Filled 2024-02-01: qty 1

## 2024-02-01 MED ORDER — ASPIRIN 81 MG PO TBEC
81.0000 mg | DELAYED_RELEASE_TABLET | Freq: Every day | ORAL | Status: DC
  Administered 2024-02-02: 81 mg via ORAL
  Filled 2024-02-01: qty 1

## 2024-02-01 MED ORDER — INSULIN GLARGINE-YFGN 100 UNIT/ML ~~LOC~~ SOLN
10.0000 [IU] | SUBCUTANEOUS | Status: DC
  Filled 2024-02-01 (×2): qty 0.1

## 2024-02-01 MED ORDER — METHYLPREDNISOLONE SODIUM SUCC 40 MG IJ SOLR
40.0000 mg | Freq: Two times a day (BID) | INTRAMUSCULAR | Status: DC
  Administered 2024-02-01 – 2024-02-03 (×5): 40 mg via INTRAVENOUS
  Filled 2024-02-01 (×5): qty 1

## 2024-02-01 MED ORDER — ONDANSETRON HCL 4 MG PO TABS: 4.0000 mg | ORAL_TABLET | Freq: Four times a day (QID) | ORAL | Status: DC | PRN

## 2024-02-01 MED ORDER — ALBUTEROL SULFATE (2.5 MG/3ML) 0.083% IN NEBU: 3.0000 mL | INHALATION_SOLUTION | RESPIRATORY_TRACT | Status: DC | PRN

## 2024-02-01 MED ORDER — ATORVASTATIN CALCIUM 20 MG PO TABS
20.0000 mg | ORAL_TABLET | Freq: Every day | ORAL | Status: DC
Start: 2024-02-02 — End: 2024-02-06
  Administered 2024-02-02 – 2024-02-06 (×4): 20 mg via ORAL
  Filled 2024-02-01 (×4): qty 1

## 2024-02-01 MED ORDER — PANTOPRAZOLE SODIUM 40 MG IV SOLR
40.0000 mg | INTRAVENOUS | Status: AC
  Administered 2024-02-01: 40 mg via INTRAVENOUS
  Filled 2024-02-01: qty 10
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
## 2024-02-01 NOTE — ED Notes (Addendum)
 Tristan Brooks

## 2024-02-02 ENCOUNTER — Encounter (HOSPITAL_COMMUNITY): Payer: Self-pay | Admitting: Internal Medicine

## 2024-02-02 ENCOUNTER — Telehealth: Payer: Self-pay | Admitting: Gastroenterology

## 2024-02-02 DIAGNOSIS — J449 Chronic obstructive pulmonary disease, unspecified: Secondary | ICD-10-CM

## 2024-02-02 DIAGNOSIS — R195 Other fecal abnormalities: Secondary | ICD-10-CM

## 2024-02-02 DIAGNOSIS — K5900 Constipation, unspecified: Secondary | ICD-10-CM

## 2024-02-02 DIAGNOSIS — K219 Gastro-esophageal reflux disease without esophagitis: Secondary | ICD-10-CM

## 2024-02-02 DIAGNOSIS — K922 Gastrointestinal hemorrhage, unspecified: Secondary | ICD-10-CM

## 2024-02-02 DIAGNOSIS — I350 Nonrheumatic aortic (valve) stenosis: Secondary | ICD-10-CM

## 2024-02-02 DIAGNOSIS — R112 Nausea with vomiting, unspecified: Secondary | ICD-10-CM

## 2024-02-02 LAB — GLUCOSE, CAPILLARY
Glucose-Capillary: 395 mg/dL — ABNORMAL HIGH (ref 70–99)
Glucose-Capillary: 404 mg/dL — ABNORMAL HIGH (ref 70–99)
Glucose-Capillary: 405 mg/dL — ABNORMAL HIGH (ref 70–99)
Glucose-Capillary: 374 mg/dL — ABNORMAL HIGH (ref 70–99)
Glucose-Capillary: 504 mg/dL (ref 70–99)
Glucose-Capillary: 509 mg/dL (ref 70–99)
Glucose-Capillary: 383 mg/dL — ABNORMAL HIGH (ref 70–99)

## 2024-02-02 LAB — BASIC METABOLIC PANEL WITH GFR
Anion gap: 12 (ref 5–15)
BUN: 41 mg/dL — ABNORMAL HIGH (ref 8–23)
CO2: 26 mmol/L (ref 22–32)
Calcium: 8.7 mg/dL — ABNORMAL LOW (ref 8.9–10.3)
Chloride: 95 mmol/L — ABNORMAL LOW (ref 98–111)
Creatinine, Ser: 2.26 mg/dL — ABNORMAL HIGH (ref 0.61–1.24)
GFR, Estimated: 28 mL/min — ABNORMAL LOW (ref >60)
Glucose, Bld: 411 mg/dL — ABNORMAL HIGH (ref 70–99)
Potassium: 4.8 mmol/L (ref 3.5–5.1)
Sodium: 133 mmol/L — ABNORMAL LOW (ref 135–145)

## 2024-02-02 LAB — CBC
HCT: 29.2 % — ABNORMAL LOW (ref 39.0–52.0)
Hemoglobin: 8.9 g/dL — ABNORMAL LOW (ref 13.0–17.0)
MCH: 24.8 pg — ABNORMAL LOW (ref 26.0–34.0)
MCHC: 30.5 g/dL (ref 30.0–36.0)
MCV: 81.3 fL (ref 80.0–100.0)
Platelets: 193 K/uL (ref 150–400)
RBC: 3.59 MIL/uL — ABNORMAL LOW (ref 4.22–5.81)
RDW: 17.2 % — ABNORMAL HIGH (ref 11.5–15.5)
WBC: 7.1 K/uL (ref 4.0–10.5)
nRBC: 0 % (ref 0.0–0.2)

## 2024-02-02 LAB — RETICULOCYTES
Immature Retic Fract: 37.6 % — ABNORMAL HIGH (ref 2.3–15.9)
RBC.: 3.56 MIL/uL — ABNORMAL LOW (ref 4.22–5.81)
Retic Count, Absolute: 80.8 K/uL (ref 19.0–186.0)
Retic Ct Pct: 2.3 % (ref 0.4–3.1)

## 2024-02-02 LAB — IRON AND TIBC
Iron: 20 ug/dL — ABNORMAL LOW (ref 45–182)
Saturation Ratios: 4 % — ABNORMAL LOW (ref 17.9–39.5)
TIBC: 491 ug/dL — ABNORMAL HIGH (ref 250–450)
UIBC: 471 ug/dL

## 2024-02-02 LAB — FOLATE: Folate: 19.8 ng/mL (ref >5.9)

## 2024-02-02 LAB — HEMOGLOBIN A1C
Hgb A1c MFr Bld: 10.1 % — ABNORMAL HIGH (ref 4.8–5.6)
Mean Plasma Glucose: 243.17 mg/dL

## 2024-02-02 LAB — MRSA NEXT GEN BY PCR, NASAL: MRSA by PCR Next Gen: NOT DETECTED

## 2024-02-02 LAB — VITAMIN B12: Vitamin B-12: 210 pg/mL (ref 180–914)

## 2024-02-02 LAB — FERRITIN: Ferritin: 15 ng/mL — ABNORMAL LOW (ref 24–336)

## 2024-02-02 MED ORDER — CHLORHEXIDINE GLUCONATE CLOTH 2 % EX PADS
6.0000 | MEDICATED_PAD | Freq: Every day | CUTANEOUS | Status: DC
  Administered 2024-02-02 – 2024-02-06 (×5): 6 via TOPICAL

## 2024-02-02 MED ORDER — INSULIN GLARGINE-YFGN 100 UNIT/ML ~~LOC~~ SOLN
15.0000 [IU] | Freq: Two times a day (BID) | SUBCUTANEOUS | Status: DC
  Administered 2024-02-02 (×2): 15 [IU] via SUBCUTANEOUS
  Filled 2024-02-02 (×5): qty 0.15

## 2024-02-02 MED ORDER — POLYETHYLENE GLYCOL 3350 17 G PO PACK
17.0000 g | PACK | Freq: Two times a day (BID) | ORAL | Status: DC
  Administered 2024-02-02 – 2024-02-06 (×8): 17 g via ORAL
  Filled 2024-02-02 (×8): qty 1

## 2024-02-02 MED ORDER — INSULIN GLARGINE-YFGN 100 UNIT/ML ~~LOC~~ SOLN
10.0000 [IU] | Freq: Once | SUBCUTANEOUS | Status: AC
  Administered 2024-02-02: 10 [IU] via SUBCUTANEOUS
  Filled 2024-02-02: qty 0.1

## 2024-02-02 MED ORDER — SODIUM CHLORIDE 0.9 % IV SOLN
125.0000 mg | Freq: Every day | INTRAVENOUS | Status: AC
  Administered 2024-02-02 – 2024-02-04 (×3): 125 mg via INTRAVENOUS
  Filled 2024-02-02 (×3): qty 10

## 2024-02-02 NOTE — Progress Notes (Addendum)
   02/02/24 1615  Provider Notification  Provider Name/Title Dr. Luz  Date Provider Notified 02/02/24  Time Provider Notified 1615  Method of Notification Page  Notification Reason Critical Result  Test performed and critical result CBG 504  Date Critical Result Received 02/02/24  Time Critical Result Received 1609  Provider response No new orders;Other (Comment) (Give sliding scale insulin .)  Date of Provider Response 02/02/24  Time of Provider Response 1615

## 2024-02-02 NOTE — TOC CM/SW Note (Signed)
 Transition of Care [facility]) - Inpatient Brief Assessment   Patient Details  Name: Tristan Brooks MRN: [id number] Date of Birth: 04/15/1941  Transition of Care [facility]) CM/SW Contact:    Lucie Lunger, LCSWA Phone Number: 02/02/2024, 8:57 AM   Clinical Narrative: Transition of Care Department [facility]) has reviewed patient and no TOC needs have been identified at this time. We will continue to monitor patient advancement through interdiciplinary progression rounds. If new patient transition needs arise, please place a TOC consult.  Transition of Care Asessment: Insurance and Status: Insurance coverage has been reviewed Patient has primary care physician: Yes Home environment has been reviewed: From home Prior level of function:: Independent Prior/Current Home Services: No current home services Social Drivers of Health Review: SDOH reviewed no interventions necessary Readmission risk has been reviewed: Yes Transition of care needs: no transition of care needs at this time

## 2024-02-02 NOTE — Progress Notes (Signed)
  Progress Note   Patient: Tristan Brooks FMW:[id number] DOB: [birth year] DOA: 02/01/2024     1 DOS: the patient was seen and examined on 02/02/2024    Assessment and Plan: Acute on chronic iron deficiency anemia / hematochezia  - Hgb rising 8.3 -->8.9 - GI is consulted --> Further intervention per GI  - IV protonix  40 mg q12   - NPO  - ASA stopped  - IV ferrlecit  125 mg x 3 days ordered as iron level is low @ 20 - Check APTT and INR   Acute on chronic hypoxic resp failure due to COPD exacerbation  - Anoro Ellipta  1 puff daily  - Albuterol  q4 hr PRN  - Duoneb q6 hr  - IV solumedrol 40 mg q12   Acute on chronic HFpEF - Monitor   CKD3b - Monitor   DM - A1c is 10.1 - Novolog  SS ACHS  - Semglee  15 units sq bid  - Lipitor 20 mg PO daily  - Zetia  10 mg PO daily   Subjective: Pt seen and examined at the bedside. He is NPO awaiting GI consult/intervention. GI's note is in the computer but the plan is not finalized. ASA stopped. Semglee  increased due to hyperglycemia.    UPDATE:  From GI --> Due to multimorbidities, including mod/severe aortic stenosis, he is not a candidate for endoscopic procedures here at [facility] per anesthesia [facility] personally with Dr Landry today). Recommending supportive measures, resuscitate, and if overt bleeding needs to go to [place]. hopefully can get him tanked up and he can follow with outpatient GI in [facility] ideally as we won't be able to scope here. We will follow along during hospitalization!   Physical Exam: Vitals:   02/02/24 0806 02/02/24 0840 02/02/24 0841 02/02/24 0856  BP:   (!) 146/63   Pulse:   87   Resp:   17   Temp:  97.8 F (36.6 C)    TempSrc:  Oral    SpO2: 92%  94%   Weight:    112.4 kg  Height:    6' 1 (1.854 m)   Physical Exam HENT:     Head: Normocephalic.     Mouth/Throat:     Mouth: Mucous membranes are moist.  Cardiovascular:     Rate and Rhythm: Normal rate.  Pulmonary:     Effort: Pulmonary effort  is normal.  Abdominal:     Palpations: Abdomen is soft.  Musculoskeletal:        General: Normal range of motion.  Neurological:     Mental Status: He is alert. Mental status is at baseline.  Psychiatric:        Mood and Affect: Mood normal.      Disposition: Status is: Inpatient Remains inpatient appropriate because: GI consult pending and COPD tx  Planned Discharge Destination: Home    Time spent: 35 minutes  Author: Kamorah Nevils , MD 02/02/2024 10:18 AM  For on call review [email or web address].

## 2024-02-02 NOTE — Inpatient Diabetes Management (Addendum)
 Inpatient Diabetes Program Recommendations  AACE/ADA: New Consensus Statement on Inpatient Glycemic Control (2015)  Target Ranges:  Prepandial:   less than 140 mg/dL      Peak postprandial:   less than 180 mg/dL (1-2 hours)      Critically ill patients:  140 - 180 mg/dL   Lab Results  Component Value Date   GLUCAP 374 (H) 02/02/2024   HGBA1C 10.1 (H) 02/02/2024    Review of Glycemic Control  Latest Reference Range & Units 02/02/24 08:33 02/02/24 09:16 02/02/24 09:25 02/02/24 11:07  Glucose-Capillary 70 - 99 mg/dL 595 (H) 594 (H) 604 (H) 374 (H)  (H): Data is abnormally high Diabetes history: Type 2 DM Outpatient Diabetes medications: Glipizide 10 mg every day, Actos 30 mg QD Current orders for Inpatient glycemic control: Semglee  15 units BID, Novolog  0-9 units TID & HS. Solumedrol 40 mg BID  Inpatient Diabetes Program Recommendations:    Now that patient has diet order would recommend adding Novolog  5 units TID (Assuming patient consuming >50% of meal)  Noted A1C. Attempted to reach patient via phone as working off campus. Will reattrmpt. Secure chat sent to RN.   Addendum: Reattempt made, patient hung up phone. Plan for SNF.   Thanks, Tinnie Minus, MSN, RNC-OB Diabetes Coordinator [phone] (8a-5p)

## 2024-02-02 NOTE — Progress Notes (Signed)
 Patient CBG 404 at [phone]. Patient asymptomatic and has no complaints. Gave 9 units of insulin  and recheck CBG at 0925 was 395. MD aware.

## 2024-02-02 NOTE — Plan of Care (Signed)
  Problem: Acute Rehab PT Goals(only PT should resolve) Goal: Pt Will Go Supine/Side To Sit Outcome: Progressing Flowsheets (Taken 02/02/2024 1341) Pt will go Supine/Side to Sit:  with contact guard assist  with minimal assist Goal: Patient Will Transfer Sit To/From Stand Outcome: Progressing Flowsheets (Taken 02/02/2024 1341) Patient will transfer sit to/from stand: with minimal assist Goal: Pt Will Transfer Bed To Chair/Chair To Bed Outcome: Progressing Flowsheets (Taken 02/02/2024 1341) Pt will Transfer Bed to Chair/Chair to Bed: with min assist Goal: Pt Will Ambulate Outcome: Progressing Flowsheets (Taken 02/02/2024 1341) Pt will Ambulate:  25 feet  with rolling walker  with contact guard assist

## 2024-02-02 NOTE — NC FL2 (Signed)
 [place]  MEDICAID FL2 LEVEL OF CARE FORM     IDENTIFICATION  Patient Name: Tristan Brooks Birthdate: Sep 22, 1940 Sex: male Admission Date (Current Location): 02/01/2024  [facility] and [place] Number:  [employer] and Address:  [facility],  [address] [place] [address]      Provider Number: [id number]  Attending Physician Name and Address:  Luz Skelton, MD  Relative Name and Phone Number:       Current Level of Care: Hospital Recommended Level of Care: Skilled Nursing Facility Prior Approval Number:    Date Approved/Denied:   PASRR Number: [id number]  Discharge Plan: SNF    Current Diagnoses: Patient Active Problem List   Diagnosis Date Noted   Lower GI bleed 02/01/2024   COPD with acute exacerbation (HCC) 02/01/2024   Acute blood loss anemia 02/01/2024   GI bleed 02/01/2024   Thoracic aortic aneurysm (TAA) (HCC) 03/21/2022   Mass of right lung 12/08/2021   Pain in left knee 12/01/2021   Epidermoid cyst of skin 05/31/2021   History of colon polyps 11/18/2020   Bilateral primary osteoarthritis of knee 01/15/2020   Low back pain 10/23/2019   CKD (chronic kidney disease), stage III (HCC) 03/11/2019   Diabetes mellitus without complication (HCC) 12/17/2018   Hyperlipidemia 12/17/2018   Peripheral vascular disease (HCC) 05/31/2012   Aortic valve regurgitation 07/31/2011   Arthritis of right shoulder region 07/27/2011   Osteoarthritis 07/27/2011   Type II or unspecified type diabetes mellitus with neurological manifestations, not stated as uncontrolled(250.60) 07/27/2011   Essential hypertension 07/20/2011   S/P shoulder replacement 08/20/2007    Orientation RESPIRATION BLADDER Height & Weight     Self, Situation, Time, Place  O2 (2-3 L) Incontinent Weight: 247 lb 12.8 oz (112.4 kg) Height:  6' 1 (185.4 cm)  BEHAVIORAL SYMPTOMS/MOOD NEUROLOGICAL BOWEL NUTRITION STATUS      Continent Diet (See D/C summary)  AMBULATORY STATUS  COMMUNICATION OF NEEDS Skin   Extensive Assist Verbally Normal                       Personal Care Assistance Level of Assistance  Bathing, Feeding, Dressing Bathing Assistance: Limited assistance Feeding assistance: Independent Dressing Assistance: Limited assistance     Functional Limitations Info  Sight, Hearing, Speech Sight Info: Impaired Hearing Info: Adequate Speech Info: Adequate    SPECIAL CARE FACTORS FREQUENCY  PT (By licensed PT), OT (By licensed OT)     PT Frequency: 5 times weekly OT Frequency: 5 times weekly            Contractures Contractures Info: Not present    Additional Factors Info  Code Status, Allergies Code Status Info: FULL Allergies Info: NKA           Current Medications (02/02/2024):  This is the current hospital active medication list Current Facility-Administered Medications  Medication Dose Route Frequency Provider Last Rate Last Admin   acetaminophen  (TYLENOL ) tablet 650 mg  650 mg Oral Q6H PRN Laurita Manor T, MD       Or   acetaminophen  (TYLENOL ) suppository 650 mg  650 mg Rectal Q6H PRN Laurita Manor T, MD       albuterol  (PROVENTIL ) (2.5 MG/3ML) 0.083% nebulizer solution 3 mL  3 mL Nebulization Q4H PRN Laurita Manor T, MD       atorvastatin  (LIPITOR) tablet 20 mg  20 mg Oral Daily Laurita Manor T, MD   20 mg at 02/02/24 9082   Chlorhexidine  Gluconate Cloth  2 % PADS 6 each  6 each Topical Q0600 Sira, Zackery, MD   6 each at 02/02/24 0858   ezetimibe  (ZETIA ) tablet 10 mg  10 mg Oral Daily Laurita Manor T, MD   10 mg at 02/02/24 0917   ferric gluconate (FERRLECIT ) 125 mg in sodium chloride  0.9 % 100 mL IVPB  125 mg Intravenous Daily Sira, Zackery, MD       gabapentin  (NEURONTIN ) capsule 100 mg  100 mg Oral BID Laurita Manor T, MD   100 mg at 02/02/24 9082   insulin  aspart (novoLOG ) injection 0-5 Units  0-5 Units Subcutaneous QHS Laurita Manor T, MD   3 Units at 02/01/24 2208   insulin  aspart (novoLOG ) injection 0-9 Units  0-9 Units  Subcutaneous TID WC Laurita Manor DASEN, MD   9 Units at 02/02/24 1130   insulin  glargine-yfgn (SEMGLEE ) injection 15 Units  15 Units Subcutaneous BID Sira, Zackery, MD   15 Units at 02/02/24 1016   ipratropium-albuterol  (DUONEB) 0.5-2.5 (3) MG/3ML nebulizer solution 3 mL  3 mL Nebulization Q6H Laurita Manor T, MD   3 mL at 02/02/24 0804   methylPREDNISolone  sodium succinate (SOLU-MEDROL ) 40 mg/mL injection 40 mg  40 mg Intravenous Q12H Laurita Manor T, MD   40 mg at 02/02/24 9082   mirtazapine  (REMERON ) tablet 15 mg  15 mg Oral QHS Laurita Manor T, MD   15 mg at 02/01/24 2207   ondansetron  (ZOFRAN ) tablet 4 mg  4 mg Oral Q6H PRN Laurita Manor DASEN, MD       Or   ondansetron  (ZOFRAN ) injection 4 mg  4 mg Intravenous Q6H PRN Laurita Manor T, MD       pantoprazole  (PROTONIX ) injection 40 mg  40 mg Intravenous Q12H Laurita Manor T, MD   40 mg at 02/02/24 0916   polyethylene glycol (MIRALAX  / GLYCOLAX ) packet 17 g  17 g Oral BID Shirlean Therisa ORN, NP       umeclidinium-vilanterol (ANORO ELLIPTA ) 62.5-25 MCG/ACT 1 puff  1 puff Inhalation Daily Laurita Manor DASEN, MD   1 puff at 02/02/24 9070     Discharge Medications: Please see discharge summary for a list of discharge medications.  Relevant Imaging Results:  Relevant Lab Results:   Additional Information SSN: [id number], LCSWA

## 2024-02-02 NOTE — Evaluation (Signed)
 Physical Therapy Evaluation Patient Details Name: Tristan Brooks MRN: [id number] DOB: 1940/10/12 Today's Date: 02/02/2024  History of Present Illness  Tristan Brooks is a 83 y.o. male with medical history significant of COPD Gold stage II with chronic hypoxic respiratory failure on 2 L as needed, chronic iron deficiency anemia, CKD stage IIIb, IIDM, coming in with increasing SOB, and dark colored stool for 3 days.     Patient COPD, uses home oxygen  2 L as needed, since the weekend, patient started feeling more shortness of breath and wheezing but no cough, has been using 3 L continuously, initially he thought there might be some problem with the oxygen  concentrator, but 2 days ago he started to pass dark-colored stool, denies any abdominal pain no nauseous vomiting.  He feels more tired and increasing shortness of breath despite increasing oxygen  flow and decided come to ED.  He does not take any NSAIDs or drink any alcohol .   Clinical Impression  Patient demonstrates slow labored movement for sitting up at bedside requiring HOB elevated, use of handrails and minA for upright positioning secondary to core weakness. Patient currently requires modA for functional transfers  due to being very unsteady on his feet demonstrating poor standing balance and decreased BIL LE strength. Patient able to ambulate at bedside using RW but is limited due to notable increased work of breath. Patient does briefly desat upon exertion causing frequent rest breaks he is able to recover with rest and proper breathing technique. Patient will benefit from continued skilled physical therapy in hospital and recommended venue below to increase strength, balance, endurance for safe ADLs and gait.         If plan is discharge home, recommend the following: A lot of help with bathing/dressing/bathroom;A lot of help with walking and/or transfers;Help with stairs or ramp for entrance;Assist for transportation   Can travel by  private vehicle   No    Equipment Recommendations None recommended by PT  Recommendations for Other Services       Functional Status Assessment Patient has had a recent decline in their functional status and demonstrates the ability to make significant improvements in function in a reasonable and predictable amount of time.     Precautions / Restrictions Precautions Precautions: Fall Recall of Precautions/Restrictions: Intact Restrictions Weight Bearing Restrictions Per Provider Order: No      Mobility  Bed Mobility Overal bed mobility: Needs Assistance Bed Mobility: Supine to Sit     Supine to sit: HOB elevated, Used rails, Mod assist     General bed mobility comments: increased time, required pull assist for upright positioning and scooting EOB    Transfers Overall transfer level: Needs assistance Equipment used: Rolling walker (2 wheels) Transfers: Sit to/from Stand, Bed to chair/wheelchair/BSC Sit to Stand: Min assist, Mod assist   Step pivot transfers: Min assist       General transfer comment: very unsteady, x1 attempt to stand without RW, unable; BIL LE weakness    Ambulation/Gait Ambulation/Gait assistance: Min assist Gait Distance (Feet): 10 Feet Assistive device: Rolling walker (2 wheels) Gait Pattern/deviations: Trunk flexed, Decreased step length - right, Decreased step length - left, Decreased stride length Gait velocity: slow     General Gait Details: labored steps at bedside limited by SOB  Stairs            Wheelchair Mobility     Tilt Bed    Modified Rankin (Stroke Patients Only)       Balance Overall balance  assessment: Needs assistance Sitting-balance support: Feet supported, Bilateral upper extremity supported Sitting balance-Leahy Scale: Fair Sitting balance - Comments: seated EOB     Standing balance-Leahy Scale: Poor Standing balance comment: using RW                             Pertinent  Vitals/Pain Pain Assessment Pain Assessment: Faces Faces Pain Scale: Hurts little more Pain Location: low back Pain Descriptors / Indicators: Aching, Discomfort Pain Intervention(s): Monitored during session    Home Living Family/patient expects to be discharged to:: Private residence Living Arrangements: Alone Available Help at Discharge: Available PRN/intermittently;Family Type of Home: Apartment Home Access: Elevator       Home Layout: One level Home Equipment: Rollator (4 wheels);Cane - single point;Shower seat      Prior Function Prior Level of Function : Independent/Modified Independent;Driving             Mobility Comments: House and community ambulator using SPC, rollator ADLs Comments: Indepedent w/ ADLs     Extremity/Trunk Assessment        Lower Extremity Assessment Lower Extremity Assessment: Generalized weakness    Cervical / Trunk Assessment Cervical / Trunk Assessment: Kyphotic  Communication   Communication Communication: No apparent difficulties    Cognition Arousal: Alert Behavior During Therapy: WFL for tasks assessed/performed   PT - Cognitive impairments: No apparent impairments                         Following commands: Intact       Cueing Cueing Techniques: Verbal cues, Tactile cues     General Comments      Exercises     Assessment/Plan    PT Assessment Patient needs continued PT services  PT Problem List Decreased strength;Decreased mobility;Decreased activity tolerance;Decreased balance       PT Treatment Interventions DME instruction;Therapeutic activities;Gait training;Patient/family education;Therapeutic exercise;Balance training;Functional mobility training    PT Goals (Current goals can be found in the Care Plan section)  Acute Rehab PT Goals Patient Stated Goal: return home PT Goal Formulation: With patient Time For Goal Achievement: 02/16/24 Potential to Achieve Goals: Good    Frequency Min  3X/week     Co-evaluation               AM-PAC PT 6 Clicks Mobility  Outcome Measure Help needed turning from your back to your side while in a flat bed without using bedrails?: A Little Help needed moving from lying on your back to sitting on the side of a flat bed without using bedrails?: A Lot Help needed moving to and from a bed to a chair (including a wheelchair)?: A Lot Help needed standing up from a chair using your arms (e.g., wheelchair or bedside chair)?: A Lot Help needed to walk in hospital room?: A Lot Help needed climbing 3-5 steps with a railing? : A Lot 6 Click Score: 13    End of Session Equipment Utilized During Treatment: Gait belt;Oxygen  Activity Tolerance: Patient limited by fatigue Patient left: in chair;with call bell/phone within reach Nurse Communication: Mobility status PT Visit Diagnosis: Muscle weakness (generalized) (M62.81);Unsteadiness on feet (R26.81);Other abnormalities of gait and mobility (R26.89)    Time: 9089-9059 PT Time Calculation (min) (ACUTE ONLY): 30 min   Charges:   PT Evaluation $PT Eval Moderate Complexity: 1 Mod PT Treatments $Therapeutic Activity: 23-37 mins PT General Charges $$ ACUTE PT VISIT: 1 Visit  1:40 PM, 02/02/24,  Kameisha Malicki, SPT

## 2024-02-02 NOTE — Telephone Encounter (Signed)
 Mindy or Ann:  Please refer patient to [facility] for IDA with heme positive stool, concern for UGI source, not a candidate for endoscopy here at [facility] due to multimorbidities and mod/severe aortic stenosis.  Dena: please have patient complete CBC as outpatient next week.

## 2024-02-02 NOTE — TOC Initial Note (Signed)
 Transition of Care [facility]) - Initial/Assessment Note    Patient Details  Name: Tristan Brooks MRN: [id number] Date of Birth: 11-04-1940  [facility]) CM/SW Contact:    Lucie Lunger, LCSWA Phone Number: 02/02/2024, 11:44 AM  Clinical Narrative:                 CSW updated that PT is recommending SNF for pt at D/C. CSW spoke with pt at bedside to complete assessment. Pt lives alone and is able to complete his ADLs independently. Pt drives to appointments when needed. Pt has not had HH. Pt has a walker and cane to use when needed. Pt has home O2 that he wears when needed. CSW spoke with pt about SNF recommendation, he is agreeable to referral being sent out and prefers placement in [place] if possible. First choice is [facility] is a bed is open. CSW to complete referral and send out for review. TOC to follow.   Expected Discharge Plan: Skilled Nursing Facility Barriers to Discharge: Continued Medical Work up   Patient Goals and CMS Choice Patient states their goals for this hospitalization and ongoing recovery are:: go to SNF CMS Medicare.gov Compare Post Acute Care list provided to:: Patient Choice offered to / list presented to : Patient [place] ownership interest in [facility].provided to:: Patient    Expected Discharge Plan and Services In-house Referral: Clinical Social Work Discharge Planning Services: CM Consult Post Acute Care Choice: Skilled Nursing Facility Living arrangements for the past 2 months: Single Family Home                                      Prior Living Arrangements/Services Living arrangements for the past 2 months: Single Family Home Lives with:: Self Patient language and need for interpreter reviewed:: Yes Do you feel safe going back to the place where you live?: Yes      Need for Family Participation in Patient Care: Yes (Comment) Care giver support system in place?: Yes (comment) Current home services: DME Criminal  Activity/Legal Involvement Pertinent to Current Situation/Hospitalization: No - Comment as needed  Activities of Daily Living   ADL Screening (condition at time of admission) Independently performs ADLs?: No Does the patient have a NEW difficulty with bathing/dressing/toileting/self-feeding that is expected to last >3 days?: Yes (Initiates electronic notice to provider for possible OT consult) Does the patient have a NEW difficulty with getting in/out of bed, walking, or climbing stairs that is expected to last >3 days?: No Does the patient have a NEW difficulty with communication that is expected to last >3 days?: No Is the patient deaf or have difficulty hearing?: No Does the patient have difficulty seeing, even when wearing glasses/contacts?: No Does the patient have difficulty concentrating, remembering, or making decisions?: Yes  Permission Sought/Granted                  Emotional Assessment Appearance:: Appears stated age Attitude/Demeanor/Rapport: Engaged Affect (typically observed): Accepting Orientation: : Oriented to Self, Oriented to  Time, Oriented to Place, Oriented to Situation Alcohol  / Substance Use: Not Applicable Psych Involvement: No (comment)  Admission diagnosis:  GI bleed [K92.2] Upper GI bleed [K92.2] Anemia, unspecified type [D64.9] Chronic obstructive pulmonary disease, unspecified COPD type (HCC) [J44.9] Patient Active Problem List   Diagnosis Date Noted   Lower GI bleed 02/01/2024   COPD with acute exacerbation (HCC) 02/01/2024  Acute blood loss anemia 02/01/2024   GI bleed 02/01/2024   Thoracic aortic aneurysm (TAA) (HCC) 03/21/2022   Mass of right lung 12/08/2021   Pain in left knee 12/01/2021   Epidermoid cyst of skin 05/31/2021   History of colon polyps 11/18/2020   Bilateral primary osteoarthritis of knee 01/15/2020   Low back pain 10/23/2019   CKD (chronic kidney disease), stage III (HCC) 03/11/2019   Diabetes mellitus without  complication (HCC) 12/17/2018   Hyperlipidemia 12/17/2018   Peripheral vascular disease (HCC) 05/31/2012   Aortic valve regurgitation 07/31/2011   Arthritis of right shoulder region 07/27/2011   Osteoarthritis 07/27/2011   Type II or unspecified type diabetes mellitus with neurological manifestations, not stated as uncontrolled(250.60) 07/27/2011   Essential hypertension 07/20/2011   S/P shoulder replacement 08/20/2007   PCP:  Practice, [facility]:   [facility] [address], [place] - [address] [place] [address] Phone: [phone] Fax: [phone]  [id number] - [place], [place] - [address] [place] [address] Phone: [phone] Fax: [phone]  [id number] [place] [address] [place] [address] Phone: [phone] Fax: [phone]     Social Drivers of Health (SDOH) Social History: SDOH Screenings   Food Insecurity: No Food Insecurity (02/02/2024)  Housing: Unknown (02/02/2024)  Transportation Needs: Unknown (02/02/2024)  Utilities: Patient Unable To Answer (02/02/2024)  Financial Resource Strain: Low Risk  (12/26/2023)   Received from [facility]  Physical Activity: Inactive (12/26/2023)   [facility] from [facility]  Social Connections: Moderately Integrated (02/02/2024)  Stress: No Stress Concern Present (12/26/2023)   Received from [facility]  Tobacco Use: High Risk (02/01/2024)  Health Literacy: Medium Risk (12/26/2023)   Received from [facility]   SDOH Interventions:     Readmission Risk Interventions    02/02/2024   11:40 AM  Readmission Risk Prevention Plan  Transportation Screening Complete  Home Care Screening Complete  Medication Review (RN CM) Complete
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
## 2024-02-02 NOTE — ED Notes (Signed)
 Pt placed in hospital bed with bed alarm set.

## 2024-02-02 NOTE — Progress Notes (Signed)
   02/02/24 1709  Provider Notification  Provider Name/Title Dr. Luz  Date Provider Notified 02/02/24  Time Provider Notified 1709  Method of Notification Page  Notification Reason Critical Result  Test performed and critical result CBG 509  Date Critical Result Received 02/02/24  Time Critical Result Received 1708  Provider response See new orders  Date of Provider Response 02/02/24  Time of Provider Response 1714   Semglee  10 units ordered.

## 2024-02-02 NOTE — Plan of Care (Signed)

## 2024-02-03 DIAGNOSIS — K922 Gastrointestinal hemorrhage, unspecified: Secondary | ICD-10-CM | POA: Diagnosis not present

## 2024-02-03 LAB — APTT: aPTT: 34 s (ref 24–36)

## 2024-02-03 LAB — COMPREHENSIVE METABOLIC PANEL WITH GFR
ALT: 33 U/L (ref 0–44)
AST: 30 U/L (ref 15–41)
Albumin: 3.2 g/dL — ABNORMAL LOW (ref 3.5–5.0)
Alkaline Phosphatase: 57 U/L (ref 38–126)
Anion gap: 8 (ref 5–15)
BUN: 44 mg/dL — ABNORMAL HIGH (ref 8–23)
CO2: 25 mmol/L (ref 22–32)
Calcium: 9 mg/dL (ref 8.9–10.3)
Chloride: 100 mmol/L (ref 98–111)
Creatinine, Ser: 2.16 mg/dL — ABNORMAL HIGH (ref 0.61–1.24)
GFR, Estimated: 30 mL/min — ABNORMAL LOW (ref >60)
Glucose, Bld: 338 mg/dL — ABNORMAL HIGH (ref 70–99)
Potassium: 5.5 mmol/L — ABNORMAL HIGH (ref 3.5–5.1)
Sodium: 133 mmol/L — ABNORMAL LOW (ref 135–145)
Total Bilirubin: <0.2 mg/dL (ref 0.0–1.2)
Total Protein: 6.8 g/dL (ref 6.5–8.1)

## 2024-02-03 LAB — CBC
HCT: 28.3 % — ABNORMAL LOW (ref 39.0–52.0)
Hemoglobin: 8.6 g/dL — ABNORMAL LOW (ref 13.0–17.0)
MCH: 24.3 pg — ABNORMAL LOW (ref 26.0–34.0)
MCHC: 30.4 g/dL (ref 30.0–36.0)
MCV: 79.9 fL — ABNORMAL LOW (ref 80.0–100.0)
Platelets: 191 K/uL (ref 150–400)
RBC: 3.54 MIL/uL — ABNORMAL LOW (ref 4.22–5.81)
RDW: 17.2 % — ABNORMAL HIGH (ref 11.5–15.5)
WBC: 8 K/uL (ref 4.0–10.5)
nRBC: 0.4 % — ABNORMAL HIGH (ref 0.0–0.2)

## 2024-02-03 LAB — GLUCOSE, CAPILLARY
Glucose-Capillary: 358 mg/dL — ABNORMAL HIGH (ref 70–99)
Glucose-Capillary: 336 mg/dL — ABNORMAL HIGH (ref 70–99)
Glucose-Capillary: 350 mg/dL — ABNORMAL HIGH (ref 70–99)
Glucose-Capillary: 348 mg/dL — ABNORMAL HIGH (ref 70–99)

## 2024-02-03 LAB — PHOSPHORUS: Phosphorus: 3.5 mg/dL (ref 2.5–4.6)

## 2024-02-03 LAB — PROTIME-INR
INR: 1.1 (ref 0.8–1.2)
Prothrombin Time: 14.9 s (ref 11.4–15.2)

## 2024-02-03 LAB — MAGNESIUM: Magnesium: 2.7 mg/dL — ABNORMAL HIGH (ref 1.7–2.4)

## 2024-02-03 MED ORDER — SODIUM ZIRCONIUM CYCLOSILICATE 10 G PO PACK
10.0000 g | PACK | Freq: Three times a day (TID) | ORAL | Status: AC
  Administered 2024-02-03 – 2024-02-04 (×6): 10 g via ORAL
  Filled 2024-02-03 (×6): qty 1

## 2024-02-03 MED ORDER — INSULIN GLARGINE-YFGN 100 UNIT/ML ~~LOC~~ SOLN
30.0000 [IU] | Freq: Two times a day (BID) | SUBCUTANEOUS | Status: DC
  Administered 2024-02-03 (×2): 30 [IU] via SUBCUTANEOUS
  Filled 2024-02-03 (×5): qty 0.3

## 2024-02-03 NOTE — Progress Notes (Signed)
  Progress Note   Patient: Tristan Brooks FMW:[id number] DOB: Dec 10, 1940 DOA: 02/01/2024     2 DOS: the patient was seen and examined on 02/03/2024    Assessment and Plan: Acute on chronic iron deficiency anemia / hematochezia  - Hgb stable 8.6-8.9 - GI consult appreciated --> conservative approach and monitoring due to multiple co-morbidities  - IV protonix  40 mg q12   - Soft diet  - ASA stopped  - IV ferrlecit  125 mg x 3 days ordered as iron level is low @ 20 - APTT and INR are wnl    Acute on chronic hypoxic resp failure due to COPD exacerbation  - Anoro Ellipta  1 puff daily  - Albuterol  q4 hr PRN  - Duoneb q6 hr  - IV solumedrol 40 mg q12    Acute on chronic HFpEF - Monitor    CKD3b - Monitor    DM - A1c is 10.1 - Novolog  SS ACHS  - Semglee  30 units sq bid  - Lipitor 20 mg PO daily  - Zetia  10 mg PO daily  - Gabapentin  100 mg PO bid   6. Hyperkalemia  - Lokelma  10g PO tid   Subjective: Pt seen and examined at the bedside. Hgb stable. Semglee  increased to 30 units due to ongoing hyperglycemia. Lokelma  ordered as pt has hyperK+. Recheck labs in the AM.  Physical Exam: Vitals:   02/03/24 0424 02/03/24 0757 02/03/24 0800 02/03/24 0900  BP:   (!) 143/84 137/84  Pulse:   (!) 105 97  Resp:   (!) 21 (!) 23  Temp: 97.8 F (36.6 C) 97.9 F (36.6 C)    TempSrc: Oral Oral    SpO2:   91% 97%  Weight:      Height:       HENT:     Head: Normocephalic.     Mouth/Throat:     Mouth: Mucous membranes are moist.  Cardiovascular:     Rate and Rhythm: Intermittent tachycardia  Pulmonary:     Effort: Pulmonary effort is normal.  Abdominal:     Palpations: Abdomen is soft.  Musculoskeletal:        General: Normal range of motion.  Neurological:     Mental Status: He is alert. Mental status is at baseline.  Psychiatric:        Mood and Affect: Mood normal.     Disposition: Status is: Inpatient Remains inpatient appropriate because: IV iron, COPD treatment &  treatment of hyperkalemia   Planned Discharge Destination: Barriers to discharge: As above    Time spent: 35 minutes  Author: Ashiah Karpowicz , MD 02/03/2024 9:34 AM  For on call review [email or web address].

## 2024-02-04 DIAGNOSIS — K922 Gastrointestinal hemorrhage, unspecified: Secondary | ICD-10-CM | POA: Diagnosis not present

## 2024-02-04 LAB — CBC
HCT: 30.5 % — ABNORMAL LOW (ref 39.0–52.0)
Hemoglobin: 9.1 g/dL — ABNORMAL LOW (ref 13.0–17.0)
MCH: 24.2 pg — ABNORMAL LOW (ref 26.0–34.0)
MCHC: 29.8 g/dL — ABNORMAL LOW (ref 30.0–36.0)
MCV: 81.1 fL (ref 80.0–100.0)
Platelets: 192 K/uL (ref 150–400)
RBC: 3.76 MIL/uL — ABNORMAL LOW (ref 4.22–5.81)
RDW: 17.4 % — ABNORMAL HIGH (ref 11.5–15.5)
WBC: 9.1 K/uL (ref 4.0–10.5)
nRBC: 0.4 % — ABNORMAL HIGH (ref 0.0–0.2)

## 2024-02-04 LAB — COMPREHENSIVE METABOLIC PANEL WITH GFR
ALT: 31 U/L (ref 0–44)
AST: 24 U/L (ref 15–41)
Albumin: 3.2 g/dL — ABNORMAL LOW (ref 3.5–5.0)
Alkaline Phosphatase: 56 U/L (ref 38–126)
Anion gap: 7 (ref 5–15)
BUN: 45 mg/dL — ABNORMAL HIGH (ref 8–23)
CO2: 27 mmol/L (ref 22–32)
Calcium: 9.2 mg/dL (ref 8.9–10.3)
Chloride: 99 mmol/L (ref 98–111)
Creatinine, Ser: 2.19 mg/dL — ABNORMAL HIGH (ref 0.61–1.24)
GFR, Estimated: 29 mL/min — ABNORMAL LOW (ref >60)
Glucose, Bld: 320 mg/dL — ABNORMAL HIGH (ref 70–99)
Potassium: 5.4 mmol/L — ABNORMAL HIGH (ref 3.5–5.1)
Sodium: 133 mmol/L — ABNORMAL LOW (ref 135–145)
Total Bilirubin: <0.2 mg/dL (ref 0.0–1.2)
Total Protein: 6.7 g/dL (ref 6.5–8.1)

## 2024-02-04 LAB — GLUCOSE, CAPILLARY
Glucose-Capillary: 307 mg/dL — ABNORMAL HIGH (ref 70–99)
Glucose-Capillary: 321 mg/dL — ABNORMAL HIGH (ref 70–99)
Glucose-Capillary: 371 mg/dL — ABNORMAL HIGH (ref 70–99)
Glucose-Capillary: 258 mg/dL — ABNORMAL HIGH (ref 70–99)

## 2024-02-04 LAB — MAGNESIUM: Magnesium: 2.6 mg/dL — ABNORMAL HIGH (ref 1.7–2.4)

## 2024-02-04 LAB — PHOSPHORUS: Phosphorus: 4.3 mg/dL (ref 2.5–4.6)

## 2024-02-04 MED ORDER — INSULIN GLARGINE-YFGN 100 UNIT/ML ~~LOC~~ SOLN
45.0000 [IU] | Freq: Two times a day (BID) | SUBCUTANEOUS | Status: DC
  Administered 2024-02-04 – 2024-02-06 (×5): 45 [IU] via SUBCUTANEOUS
  Filled 2024-02-04 (×7): qty 0.45

## 2024-02-04 MED ORDER — PREDNISOLONE 5 MG PO TABS: 20.0000 mg | ORAL_TABLET | Freq: Two times a day (BID) | ORAL | Status: DC

## 2024-02-04 MED ORDER — SODIUM CHLORIDE 0.9 % IV SOLN: INTRAVENOUS | Status: DC

## 2024-02-04 MED ORDER — PREDNISONE 20 MG PO TABS
20.0000 mg | ORAL_TABLET | Freq: Two times a day (BID) | ORAL | Status: DC
  Administered 2024-02-04 – 2024-02-06 (×4): 20 mg via ORAL
  Filled 2024-02-04 (×4): qty 1

## 2024-02-04 MED ORDER — IPRATROPIUM-ALBUTEROL 0.5-2.5 (3) MG/3ML IN SOLN
3.0000 mL | Freq: Three times a day (TID) | RESPIRATORY_TRACT | Status: DC
  Administered 2024-02-05 – 2024-02-06 (×5): 3 mL via RESPIRATORY_TRACT
  Filled 2024-02-04 (×5): qty 3

## 2024-02-04 NOTE — Progress Notes (Signed)
  Progress Note   Patient: Tristan Brooks FMW:[id number] DOB: 1940/07/14 DOA: 02/01/2024     3 DOS: the patient was seen and examined on 02/04/2024    Assessment and Plan: Acute on chronic iron deficiency anemia / hematochezia  - Hgb 9.1 - GI consult appreciated --> conservative approach and monitoring due to multiple co-morbidities  - IV protonix  40 mg q12   - ASA stopped  - Completed IV ferrlecit  125 mg x 3 days  - APTT and INR are wnl    Acute on chronic hypoxic resp failure due to COPD exacerbation  - Anoro Ellipta  1 puff daily  - Albuterol  q4 hr PRN  - Duoneb q6 hr  - Prednisone  20 mg PO bid    Acute on chronic HFpEF - Monitor    CKD3b - IV NS 75 cc/hr   DM - A1c is 10.1 - Heart healthy/carb modified diet  - Novolog  SS ACHS  - Semglee  45 units sq bid  - Lipitor 20 mg PO daily  - Zetia  10 mg PO daily  - Gabapentin  100 mg PO bid    6. Hyperkalemia  - Lokelma  10g PO tid   Subjective: Pt seen and examined at the bedside. IV steroids changed to PO prednisone . Semglee  increased today as the pt remains w/ hyperglycemia. Hgb stable at 9.1. CM working on placement/disposition. FL2 signed.   Physical Exam: Vitals:   02/04/24 0744 02/04/24 0800 02/04/24 0900 02/04/24 1000  BP:  (!) 147/63 (!) 123/56 (!) 130/54  Pulse:  (!) 101 99 95  Resp:  16 18 19   Temp: 98.1 F (36.7 C)     TempSrc: Axillary     SpO2:  92% 90% 95%  Weight:      Height:       HENT:     Head: Normocephalic.     Mouth/Throat:     Mouth: Mucous membranes are moist.  Cardiovascular:     Rate and Rhythm: Intermittent tachycardia  Pulmonary:     Effort: Pulmonary effort is normal.  Abdominal:     Palpations: Abdomen is soft.  Musculoskeletal:        General: Normal range of motion.  Neurological:     Mental Status: He is alert. Mental status is at baseline.  Psychiatric:        Mood and Affect: Mood normal.     Disposition: Status is: Inpatient Remains inpatient appropriate because: IV  fluids and serial labs. CM for disposition  Planned Discharge Destination: Skilled nursing facility    Time spent: 35 minutes  Author: Estelle Greenleaf , MD 02/04/2024 10:21 AM  For on call review [email or web address].

## 2024-02-04 NOTE — Plan of Care (Signed)
  Problem: Education: Goal: Ability to describe self-care measures that may prevent or decrease complications (Diabetes Survival Skills Education) will improve Outcome: Progressing Goal: Individualized Educational Video(s) Outcome: Progressing   Problem: Coping: Goal: Ability to adjust to condition or change in health will improve Outcome: Progressing   Problem: Fluid Volume: Goal: Ability to maintain a balanced intake and output will improve Outcome: Progressing   Problem: Health Behavior/Discharge Planning: Goal: Ability to identify and utilize available resources and services will improve Outcome: Progressing Goal: Ability to manage health-related needs will improve Outcome: Progressing   Problem: Metabolic: Goal: Ability to maintain appropriate glucose levels will improve Outcome: Progressing   Problem: Nutritional: Goal: Maintenance of adequate nutrition will improve Outcome: Progressing Goal: Progress toward achieving an optimal weight will improve Outcome: Progressing   Problem: Skin Integrity: Goal: Risk for impaired skin integrity will decrease Outcome: Progressing   Problem: Tissue Perfusion: Goal: Adequacy of tissue perfusion will improve Outcome: Progressing   Problem: Education: Goal: Knowledge of General Education information will improve Description: Including pain rating scale, medication(s)/side effects and non-pharmacologic comfort measures Outcome: Progressing   Problem: Health Behavior/Discharge Planning: Goal: Ability to manage health-related needs will improve Outcome: Progressing   Problem: Clinical Measurements: Goal: Ability to maintain clinical measurements within normal limits will improve Outcome: Progressing Goal: Will remain free from infection Outcome: Progressing Goal: Diagnostic test results will improve Outcome: Progressing Goal: Respiratory complications will improve Outcome: Progressing Goal: Cardiovascular complication will  be avoided Outcome: Progressing   Problem: Activity: Goal: Risk for activity intolerance will decrease Outcome: Not Progressing   Problem: Nutrition: Goal: Adequate nutrition will be maintained Outcome: Progressing   Problem: Coping: Goal: Level of anxiety will decrease Outcome: Progressing   Problem: Elimination: Goal: Will not experience complications related to bowel motility Outcome: Progressing Goal: Will not experience complications related to urinary retention Outcome: Progressing   Problem: Pain Managment: Goal: General experience of comfort will improve and/or be controlled Outcome: Progressing   Problem: Safety: Goal: Ability to remain free from injury will improve Outcome: Progressing   Problem: Skin Integrity: Goal: Risk for impaired skin integrity will decrease Outcome: Progressing

## 2024-02-05 ENCOUNTER — Ambulatory Visit: Admitting: Nurse Practitioner

## 2024-02-05 DIAGNOSIS — K922 Gastrointestinal hemorrhage, unspecified: Secondary | ICD-10-CM | POA: Diagnosis not present

## 2024-02-05 LAB — CBC
HCT: 29.4 % — ABNORMAL LOW (ref 39.0–52.0)
Hemoglobin: 8.6 g/dL — ABNORMAL LOW (ref 13.0–17.0)
MCH: 24.2 pg — ABNORMAL LOW (ref 26.0–34.0)
MCHC: 29.3 g/dL — ABNORMAL LOW (ref 30.0–36.0)
MCV: 82.6 fL (ref 80.0–100.0)
Platelets: 174 K/uL (ref 150–400)
RBC: 3.56 MIL/uL — ABNORMAL LOW (ref 4.22–5.81)
RDW: 17.6 % — ABNORMAL HIGH (ref 11.5–15.5)
WBC: 8.2 K/uL (ref 4.0–10.5)
nRBC: 1 % — ABNORMAL HIGH (ref 0.0–0.2)

## 2024-02-05 LAB — MAGNESIUM: Magnesium: 2.6 mg/dL — ABNORMAL HIGH (ref 1.7–2.4)

## 2024-02-05 LAB — COMPREHENSIVE METABOLIC PANEL WITH GFR
ALT: 25 U/L (ref 0–44)
AST: 23 U/L (ref 15–41)
Albumin: 2.9 g/dL — ABNORMAL LOW (ref 3.5–5.0)
Alkaline Phosphatase: 55 U/L (ref 38–126)
Anion gap: 7 (ref 5–15)
BUN: 44 mg/dL — ABNORMAL HIGH (ref 8–23)
CO2: 27 mmol/L (ref 22–32)
Calcium: 8.6 mg/dL — ABNORMAL LOW (ref 8.9–10.3)
Chloride: 101 mmol/L (ref 98–111)
Creatinine, Ser: 2.03 mg/dL — ABNORMAL HIGH (ref 0.61–1.24)
GFR, Estimated: 32 mL/min — ABNORMAL LOW (ref >60)
Glucose, Bld: 184 mg/dL — ABNORMAL HIGH (ref 70–99)
Potassium: 5 mmol/L (ref 3.5–5.1)
Sodium: 135 mmol/L (ref 135–145)
Total Bilirubin: <0.2 mg/dL (ref 0.0–1.2)
Total Protein: 6.3 g/dL — ABNORMAL LOW (ref 6.5–8.1)

## 2024-02-05 LAB — GLUCOSE, CAPILLARY
Glucose-Capillary: 176 mg/dL — ABNORMAL HIGH (ref 70–99)
Glucose-Capillary: 281 mg/dL — ABNORMAL HIGH (ref 70–99)
Glucose-Capillary: 214 mg/dL — ABNORMAL HIGH (ref 70–99)
Glucose-Capillary: 250 mg/dL — ABNORMAL HIGH (ref 70–99)

## 2024-02-05 LAB — PHOSPHORUS: Phosphorus: 4 mg/dL (ref 2.5–4.6)

## 2024-02-05 NOTE — TOC Progression Note (Signed)
 Transition of Care [facility]) - Progression Note    Patient Details  Name: GERY SABEDRA MRN: [id number] Date of Birth: 07/02/1940  Transition of [facility]) CM/SW Contact  Noreen KATHEE [place], [place] Phone Number: 02/05/2024, 4:06 PM  Clinical Narrative:     CSW reached out to [facility] via message and updated Destiny with patient DC for tomorrow . Awaiting to hear back from Destiny in admission regarding message. TOC following.   Expected Discharge Plan: Skilled Nursing Facility Barriers to Discharge: Continued Medical Work up  Expected Discharge Plan and Services In-house Referral: Clinical Social Work Discharge Planning Services: CM Consult Post Acute Care Choice: Skilled Nursing Facility Living arrangements for the past 2 months: Single Family Home                    Social Drivers of Health (SDOH) Interventions SDOH Screenings   Food Insecurity: No Food Insecurity (02/02/2024)  Housing: Unknown (02/02/2024)  Transportation Needs: Unknown (02/02/2024)  Utilities: Patient Unable To Answer (02/02/2024)  Financial Resource Strain: Low Risk  (12/26/2023)   Received from [facility]  Physical Activity: Inactive (12/26/2023)   Received from [facility]  Social Connections: Moderately Integrated (02/02/2024)  Stress: No Stress Concern Present (12/26/2023)   Received from [facility]  Tobacco Use: High Risk (02/01/2024)  Health Literacy: Medium Risk (12/26/2023)   Received from [facility]    Readmission Risk Interventions    02/05/2024    4:06 PM 02/02/2024   11:40 AM  Readmission Risk Prevention Plan  Transportation Screening Complete Complete  Home Care Screening Complete Complete  Medication Review (RN CM) Complete Complete

## 2024-02-05 NOTE — Telephone Encounter (Signed)
 Referral sent, they will contact patient with apt

## 2024-02-05 NOTE — Telephone Encounter (Signed)
 Pt had a CBC today. Do you still want a CBC for pt this week?

## 2024-02-05 NOTE — Inpatient Diabetes Management (Addendum)
 Inpatient Diabetes Program Recommendations  AACE/ADA: New Consensus Statement on Inpatient Glycemic Control   Target Ranges:  Prepandial:   less than 140 mg/dL      Peak postprandial:   less than 180 mg/dL (1-2 hours)      Critically ill patients:  140 - 180 mg/dL    Latest Reference Range & Units 02/04/24 07:23 02/04/24 11:25 02/04/24 16:54 02/04/24 22:24  Glucose-Capillary 70 - 99 mg/dL 692 (H) 678 (H) 628 (H) 258 (H)    Latest Reference Range & Units 02/02/24 02:47  Hemoglobin A1C 4.8 - 5.6 % 10.1 (H)   Review of Glycemic Control  Diabetes history: DM2 Outpatient Diabetes medications: Glipizide XL 10 mg daily, Actos 30 mg daily Current orders for Inpatient glycemic control: Semglee  45 units BID, Novolog  0-9 units TID with meals, Novolog  0-5 units at bedtime; Prednisone  20 mg BID  Inpatient Diabetes Program Recommendations:    Insulin : If steroids are continued, please consider ordering Novolog  5 units TID with meals for meal coverage if patient eats at least 50% of meals.  Outpatient DM: Due to hx of heart failure, would recommend stopping Actos.  Will likely need to discharge to SNF on insulin  regimen.   Addendum 02/05/24@13 :47- Spoke with patient about diabetes and home regimen for diabetes control. Patient reports taking Glipizide XL 10 mg daily and Actos 30 mg daily as an outpatient for diabetes control. Patient reports taking DM medications as prescribed and he reports he has never used insulin  as an outpatient.  Patient reports checking glucose daily and glucose ranges from mid 140's-mid 200's mg/dl.   Inquired about prior A1C and patient reports not being able to recall last A1C value. Discussed A1C results (10.1% on 02/02/24) and explained that current A1C indicates an average glucose of  243 mg/dl over the past 2-3 months. Discussed glucose and A1C goals. Discussed importance of checking CBGs and maintaining good CBG control to prevent long-term and short-term complications.  Explained how hyperglycemia leads to damage within blood vessels which lead to the common complications seen with uncontrolled diabetes. Stressed to the patient the importance of improving glycemic control to prevent further complications from uncontrolled diabetes. Discussed impact of nutrition, exercise, stress, sickness, and medications on diabetes control.  Discussed that he has been receiving steroids as an inpatient which is contributing to hyperglycemia.  Explained that he is requiring a significant amount of insulin  currently and he may need to use insulin  for DM control.  Patient states he is going to SNF and he would be fine with using insulin  there if needed.   Patient verbalized understanding of information discussed and reports no further questions at this time related to diabetes.  Thanks, Earnie Gainer, RN, MSN, CDCES Diabetes Coordinator Inpatient Diabetes Program [phone] (Team Pager from 8am to 5pm)

## 2024-02-05 NOTE — Plan of Care (Signed)

## 2024-02-05 NOTE — Progress Notes (Signed)
  Progress Note   Patient: Tristan SMALDONE FMW:[id number] DOB: April 12, 1941 DOA: 02/01/2024     4 DOS: the patient was seen and examined on 02/05/2024   Brief hospital course: 83 yo M admitted for hematochezia and COPD exacerbation. GI has advised a conservative approach given the pt's stable Hgb and multiple co-morbidities. During the admission pt noted to have choking episodes associated with hypoxia and speech has been consulted on 02/05/2024 to assist.  CM working on disposition.  Assessment and Plan: Acute on chronic iron deficiency anemia / hematochezia  - Hgb stable - GI consult appreciated --> conservative approach and monitoring due to multiple co-morbidities  - IV protonix  40 mg q12   - ASA stopped  - Completed IV ferrlecit  125 mg x 3 days  - APTT and INR are wnl    Acute on chronic hypoxic resp failure due to COPD exacerbation  - Anoro Ellipta  1 puff daily  - Albuterol  q4 hr PRN  - Duoneb tid - Prednisone  20 mg PO bid    Acute on chronic HFpEF - Monitor    CKD3b - Monitor    DM - A1c is 10.1 - Heart healthy/carb modified diet  - Novolog  SS ACHS  - Semglee  45 units sq bid  - Lipitor 20 mg PO daily  - Zetia  10 mg PO daily  - Gabapentin  100 mg PO bid    6. Hyperkalemia  - Resolved  Subjective: Pt seen and examined at the bedside. Glucose under better control with semglee  45 units bid. Speech consulted for choking episodes associated with hypoxia. CM working on disposition.  Physical Exam: Vitals:   02/05/24 0741 02/05/24 0749 02/05/24 0800 02/05/24 0831  BP:   (!) 154/89   Pulse: 80  95 95  Resp: 15  16 18   Temp:  98.1 F (36.7 C)    TempSrc:  Oral    SpO2: 99%  (!) 89% (!) 86%  Weight:      Height:       Physical Exam HENT:     Head: Normocephalic.  Cardiovascular:     Rate and Rhythm: Normal rate.  Pulmonary:     Effort: Pulmonary effort is normal.  Abdominal:     Palpations: Abdomen is soft.  Musculoskeletal:        General: Normal range of  motion.  Skin:    General: Skin is warm.  Neurological:     Mental Status: He is alert. Mental status is at baseline.  Psychiatric:        Mood and Affect: Mood normal.      Disposition: Status is: Inpatient Remains inpatient appropriate because: Speech eval and awaiting safe dispo from case management   Planned Discharge Destination: Skilled nursing facility    Time spent: 35 minutes  Author: Addis Tuohy , MD 02/05/2024 9:49 AM  For on call review [email or web address].

## 2024-02-05 NOTE — Evaluation (Signed)
 Clinical/Bedside Swallow Evaluation Patient Details  Name: KIING DEAKIN MRN: [id number] Date of Birth: 1941/02/06  Today's Date: 02/05/2024 Time: SLP Start Time (ACUTE ONLY): 1340 SLP Stop Time (ACUTE ONLY): 1405 SLP Time Calculation (min) (ACUTE ONLY): 25 min  Past Medical History:  Past Medical History:  Diagnosis Date   Aortic stenosis, severe    CHF (congestive heart failure) (HCC)    CKD (chronic kidney disease)    COPD (chronic obstructive pulmonary disease) (HCC)    Diabetes mellitus without complication (HCC)    HLD (hyperlipidemia)    Mass of right lung    Neuropathy    Past Surgical History:  Past Surgical History:  Procedure Laterality Date   ROTATOR CUFF REPAIR Right    TOTAL KNEE ARTHROPLASTY     HPI:  83 yo M admitted for hematochezia and COPD exacerbation. GI has advised a conservative approach given the pt's stable Hgb and multiple co-morbidities. During the admission pt noted to have choking episodes associated with hypoxia and speech has been consulted on 02/05/2024 to assist.  CM working on disposition.    Assessment / Plan / Recommendation  Clinical Impression  Clinical swallow evaluation completed at bedside. Pt reports that he has had more difficulty swallowing solids foods lately and SLP asked if it could be related to him losing his lower dentures. He stated that it could be the reason. He does wear oxygen  in setting of COPD and this may also impact airway protection during the swallow. Oral motor examination is WNL. Pt assessed with ice chips, thin water via cup/straw, puree, and regular textures. He did not exhibit any signs of reduced airway protection and no reports of globus. RN reports that Pt was coughing on water this AM. Pt is at risk for episodic penetration/aspiration events given COPD and need for O2. He was encouraged to take small sips and small bites and to avoid talking during PO intake. Recommend D3/mech soft and thin liquids and OK for PO  medication whole with water (can present larger pills in puree if needed). SLP will keep Pt on caseload to monitor Pt. Above to RN. SLP Visit Diagnosis: Dysphagia, unspecified (R13.10)    Aspiration Risk  Mild aspiration risk    Diet Recommendation Dysphagia 3 (Mech soft);Thin liquid    Liquid Administration via: Cup;Straw Medication Administration: Whole meds with liquid Supervision: Patient able to self feed;Intermittent supervision to cue for compensatory strategies Compensations: Slow rate;Small sips/bites Postural Changes: Seated upright at 90 degrees;Remain upright for at least 30 minutes after po intake    Other  Recommendations Oral Care Recommendations: Oral care BID;Staff/trained caregiver to provide oral care     Assistance Recommended at Discharge    Functional Status Assessment Patient has had a recent decline in their functional status and demonstrates the ability to make significant improvements in function in a reasonable and predictable amount of time.  Frequency and Duration min 2x/week  1 week       Prognosis Prognosis for improved oropharyngeal function: Good      Swallow Study   General Date of Onset: 02/05/24 HPI: 83 yo M admitted for hematochezia and COPD exacerbation. GI has advised a conservative approach given the pt's stable Hgb and multiple co-morbidities. During the admission pt noted to have choking episodes associated with hypoxia and speech has been consulted on 02/05/2024 to assist.  CM working on disposition. Type of Study: Bedside Swallow Evaluation Previous Swallow Assessment: N/A Diet Prior to this Study: NPO Temperature Spikes Noted: No  Respiratory Status: Nasal cannula History of Recent Intubation: No Behavior/Cognition: Alert;Pleasant mood;Cooperative Oral Cavity Assessment: Within Functional Limits Oral Care Completed by SLP: Yes Oral Cavity - Dentition: Dentures, top;Edentulous Vision: Functional for self-feeding Self-Feeding  Abilities: Able to feed self Patient Positioning: Upright in bed Baseline Vocal Quality: Normal Volitional Cough: Strong Volitional Swallow: Able to elicit    Oral/Motor/Sensory Function Overall Oral Motor/Sensory Function: Within functional limits   Ice Chips Ice chips: Within functional limits Presentation: Spoon   Thin Liquid Thin Liquid: Within functional limits Presentation: Cup;Self Fed;Straw    Nectar Thick Nectar Thick Liquid: Not tested   Honey Thick Honey Thick Liquid: Not tested   Puree Puree: Within functional limits Presentation: Spoon   Solid     Solid: Within functional limits Presentation: Self Fed     Thank you,  Lamar Candy, CCC-SLP [phone]  Deyonna Fitzsimmons 02/05/2024,2:27 PM

## 2024-02-06 ENCOUNTER — Inpatient Hospital Stay (HOSPITAL_COMMUNITY)

## 2024-02-06 DIAGNOSIS — I824Z2 Acute embolism and thrombosis of unspecified deep veins of left distal lower extremity: Secondary | ICD-10-CM | POA: Diagnosis not present

## 2024-02-06 DIAGNOSIS — R54 Age-related physical debility: Secondary | ICD-10-CM | POA: Diagnosis not present

## 2024-02-06 DIAGNOSIS — I739 Peripheral vascular disease, unspecified: Secondary | ICD-10-CM | POA: Diagnosis not present

## 2024-02-06 DIAGNOSIS — R600 Localized edema: Secondary | ICD-10-CM | POA: Diagnosis not present

## 2024-02-06 DIAGNOSIS — K219 Gastro-esophageal reflux disease without esophagitis: Secondary | ICD-10-CM | POA: Diagnosis not present

## 2024-02-06 DIAGNOSIS — M6281 Muscle weakness (generalized): Secondary | ICD-10-CM | POA: Diagnosis not present

## 2024-02-06 DIAGNOSIS — D62 Acute posthemorrhagic anemia: Secondary | ICD-10-CM | POA: Diagnosis not present

## 2024-02-06 DIAGNOSIS — Z743 Need for continuous supervision: Secondary | ICD-10-CM | POA: Diagnosis not present

## 2024-02-06 DIAGNOSIS — M1990 Unspecified osteoarthritis, unspecified site: Secondary | ICD-10-CM | POA: Diagnosis not present

## 2024-02-06 DIAGNOSIS — J9601 Acute respiratory failure with hypoxia: Secondary | ICD-10-CM | POA: Diagnosis not present

## 2024-02-06 DIAGNOSIS — R1310 Dysphagia, unspecified: Secondary | ICD-10-CM | POA: Diagnosis not present

## 2024-02-06 DIAGNOSIS — J9611 Chronic respiratory failure with hypoxia: Secondary | ICD-10-CM | POA: Diagnosis not present

## 2024-02-06 DIAGNOSIS — K5791 Diverticulosis of intestine, part unspecified, without perforation or abscess with bleeding: Secondary | ICD-10-CM | POA: Diagnosis not present

## 2024-02-06 DIAGNOSIS — J430 Unilateral pulmonary emphysema [MacLeod's syndrome]: Secondary | ICD-10-CM | POA: Diagnosis not present

## 2024-02-06 DIAGNOSIS — R2681 Unsteadiness on feet: Secondary | ICD-10-CM | POA: Diagnosis not present

## 2024-02-06 DIAGNOSIS — N1832 Chronic kidney disease, stage 3b: Secondary | ICD-10-CM | POA: Diagnosis not present

## 2024-02-06 DIAGNOSIS — R531 Weakness: Secondary | ICD-10-CM | POA: Diagnosis not present

## 2024-02-06 DIAGNOSIS — R262 Difficulty in walking, not elsewhere classified: Secondary | ICD-10-CM | POA: Diagnosis not present

## 2024-02-06 DIAGNOSIS — Z794 Long term (current) use of insulin: Secondary | ICD-10-CM | POA: Diagnosis not present

## 2024-02-06 DIAGNOSIS — R41841 Cognitive communication deficit: Secondary | ICD-10-CM | POA: Diagnosis not present

## 2024-02-06 DIAGNOSIS — I10 Essential (primary) hypertension: Secondary | ICD-10-CM | POA: Diagnosis not present

## 2024-02-06 DIAGNOSIS — I350 Nonrheumatic aortic (valve) stenosis: Secondary | ICD-10-CM | POA: Diagnosis not present

## 2024-02-06 DIAGNOSIS — I5030 Unspecified diastolic (congestive) heart failure: Secondary | ICD-10-CM | POA: Diagnosis not present

## 2024-02-06 DIAGNOSIS — K922 Gastrointestinal hemorrhage, unspecified: Secondary | ICD-10-CM | POA: Diagnosis not present

## 2024-02-06 DIAGNOSIS — J441 Chronic obstructive pulmonary disease with (acute) exacerbation: Secondary | ICD-10-CM | POA: Diagnosis not present

## 2024-02-06 DIAGNOSIS — J449 Chronic obstructive pulmonary disease, unspecified: Secondary | ICD-10-CM | POA: Diagnosis not present

## 2024-02-06 DIAGNOSIS — E1140 Type 2 diabetes mellitus with diabetic neuropathy, unspecified: Secondary | ICD-10-CM | POA: Diagnosis not present

## 2024-02-06 DIAGNOSIS — I129 Hypertensive chronic kidney disease with stage 1 through stage 4 chronic kidney disease, or unspecified chronic kidney disease: Secondary | ICD-10-CM | POA: Diagnosis not present

## 2024-02-06 DIAGNOSIS — F331 Major depressive disorder, recurrent, moderate: Secondary | ICD-10-CM | POA: Diagnosis not present

## 2024-02-06 DIAGNOSIS — E785 Hyperlipidemia, unspecified: Secondary | ICD-10-CM | POA: Diagnosis not present

## 2024-02-06 DIAGNOSIS — E1122 Type 2 diabetes mellitus with diabetic chronic kidney disease: Secondary | ICD-10-CM | POA: Diagnosis not present

## 2024-02-06 DIAGNOSIS — E559 Vitamin D deficiency, unspecified: Secondary | ICD-10-CM | POA: Diagnosis not present

## 2024-02-06 LAB — COMPREHENSIVE METABOLIC PANEL WITH GFR
ALT: 23 U/L (ref 0–44)
AST: 18 U/L (ref 15–41)
Albumin: 2.8 g/dL — ABNORMAL LOW (ref 3.5–5.0)
Alkaline Phosphatase: 57 U/L (ref 38–126)
Anion gap: 5 (ref 5–15)
BUN: 43 mg/dL — ABNORMAL HIGH (ref 8–23)
CO2: 28 mmol/L (ref 22–32)
Calcium: 8.7 mg/dL — ABNORMAL LOW (ref 8.9–10.3)
Chloride: 102 mmol/L (ref 98–111)
Creatinine, Ser: 1.94 mg/dL — ABNORMAL HIGH (ref 0.61–1.24)
GFR, Estimated: 34 mL/min — ABNORMAL LOW (ref >60)
Glucose, Bld: 119 mg/dL — ABNORMAL HIGH (ref 70–99)
Potassium: 4.7 mmol/L (ref 3.5–5.1)
Sodium: 135 mmol/L (ref 135–145)
Total Bilirubin: 0.4 mg/dL (ref 0.0–1.2)
Total Protein: 6.1 g/dL — ABNORMAL LOW (ref 6.5–8.1)

## 2024-02-06 LAB — CBC
HCT: 30.3 % — ABNORMAL LOW (ref 39.0–52.0)
Hemoglobin: 8.9 g/dL — ABNORMAL LOW (ref 13.0–17.0)
MCH: 24.4 pg — ABNORMAL LOW (ref 26.0–34.0)
MCHC: 29.4 g/dL — ABNORMAL LOW (ref 30.0–36.0)
MCV: 83 fL (ref 80.0–100.0)
Platelets: 211 K/uL (ref 150–400)
RBC: 3.65 MIL/uL — ABNORMAL LOW (ref 4.22–5.81)
RDW: 18 % — ABNORMAL HIGH (ref 11.5–15.5)
WBC: 7.7 K/uL (ref 4.0–10.5)
nRBC: 1.2 % — ABNORMAL HIGH (ref 0.0–0.2)

## 2024-02-06 LAB — MAGNESIUM: Magnesium: 2.5 mg/dL — ABNORMAL HIGH (ref 1.7–2.4)

## 2024-02-06 LAB — GLUCOSE, CAPILLARY
Glucose-Capillary: 110 mg/dL — ABNORMAL HIGH (ref 70–99)
Glucose-Capillary: 308 mg/dL — ABNORMAL HIGH (ref 70–99)

## 2024-02-06 LAB — C-REACTIVE PROTEIN: CRP: <0.5 mg/dL (ref <1.0)

## 2024-02-06 MED ORDER — PREDNISONE 20 MG PO TABS: 20.0000 mg | ORAL_TABLET | Freq: Two times a day (BID) | ORAL | 0 refills | Status: AC

## 2024-02-06 MED ORDER — INSULIN ASPART 100 UNIT/ML FLEXPEN: 3.0000 [IU] | PEN_INJECTOR | Freq: Three times a day (TID) | SUBCUTANEOUS | 0 refills | Status: AC

## 2024-02-06 MED ORDER — PANTOPRAZOLE SODIUM 40 MG PO TBEC: 40.0000 mg | DELAYED_RELEASE_TABLET | Freq: Every day | ORAL | 0 refills | Status: AC

## 2024-02-06 MED ORDER — INSULIN GLARGINE-YFGN 100 UNIT/ML ~~LOC~~ SOLN
45.0000 [IU] | Freq: Two times a day (BID) | SUBCUTANEOUS | 0 refills | Status: AC
Start: 2024-02-06 — End: ?

## 2024-02-06 NOTE — TOC Transition Note (Signed)
 Transition of Care [facility]) - Discharge Note   Patient Details  Name: Tristan Brooks MRN: [id number] Date of Birth: 1941-04-30  [facility] CM/SW Contact:  Noreen KATHEE Cleotilde ISRAEL Phone Number: 02/06/2024, 11:51 AM   Clinical Narrative:     Patient is discharging today . CSW called Holli, patient first emergency contact and updated her about patient DC to [facility]. Destiny was provided with DC summary before noon. Med necessity form was completed. Nurse given room number and number for report. EMS called. TOC signing off.    Final next level of care: Skilled Nursing Facility Barriers to Discharge: Barriers Resolved   Patient Goals and CMS Choice Patient states their goals for this hospitalization and ongoing recovery are:: return to SNF CMS Medicare.gov Compare Post Acute Care list provided to:: Patient Represenative (must comment) [facility] - ( 1st contact )) Choice offered to / list presented to :  [facility]- 1st contact) [place] ownership interest in [facility]provided to:: Patient    Discharge Placement              Patient chooses bed at:  [facility]) Patient to be transferred to facility by: EMS Name of family member notified: Holli and patient Patient and family notified of of transfer: 02/06/24  Discharge Plan and Services Additional resources added to the After Visit Summary for   In-house Referral: Clinical Social Work Discharge Planning Services: CM Consult Post Acute Care Choice: Skilled Nursing Facility                               Social Drivers of Health (SDOH) Interventions SDOH Screenings   Food Insecurity: No Food Insecurity (02/02/2024)  Housing: Unknown (02/02/2024)  Transportation Needs: Unknown (02/02/2024)  Utilities: Patient Unable To Answer (02/02/2024)  Financial Resource Strain: Low Risk  (12/26/2023)   [facility]  Physical Activity: Inactive (12/26/2023)   Received from [facility]  Social Connections: Moderately Integrated (02/02/2024)  Stress: No Stress Concern Present (12/26/2023)   [facility] from [facility]  Tobacco Use: High Risk (02/01/2024)  Health Literacy: Medium Risk (12/26/2023)   [facility] from [facility]     Readmission Risk Interventions    02/06/2024   11:46 AM 02/05/2024    4:06 PM 02/02/2024   11:40 AM  Readmission Risk Prevention Plan  Transportation Screening Complete Complete Complete  Home Care Screening Complete Complete Complete  Medication Review (RN CM) Complete Complete Complete

## 2024-02-06 NOTE — Progress Notes (Signed)
 Physical Therapy Treatment Patient Details Name: Tristan Brooks MRN: [id number] DOB: Jun 28, 1940 Today's Date: 02/06/2024   History of Present Illness Tristan Tristan Brooks is a 83 y.o. male with medical history significant of COPD Gold stage II with chronic hypoxic respiratory failure on 2 L as needed, chronic iron deficiency anemia, CKD stage IIIb, IIDM, coming in with increasing SOB, and dark colored stool for 3 days.     Patient COPD, uses home oxygen  2 L as needed, since the weekend, patient started feeling more shortness of breath and wheezing but no cough, has been using 3 L continuously, initially he thought there might be some problem with the oxygen  concentrator, but 2 days ago he started to pass dark-colored stool, denies any abdominal pain no nauseous vomiting.  He feels more tired and increasing shortness of breath despite increasing oxygen  flow and decided come to ED.  He does not take any NSAIDs or drink any alcohol .    PT Comments  Pt sitting in chair upon entrance and willing to participate with therapy.  Pt limited by Lt LE pain, pre-medicated prior therapy. Pt limited by decreased saturation during transfer and gait training.  Ambulated 20ft with RW with decreased O2 saturation to 82% so returned to chair.  Cueing to increased breathing through nose with fast return to 91%.  Pt limited by fatigue and LE weakness as well.  Seated exercises complete for LE strengthening with O2 saturation at 96% on 4L via nasal cannula.  No reports of increased pain through session.  RN aware of status.  Pt left in chair with call bell within reach.   If plan is discharge home, recommend the following:     Can travel by private vehicle        Equipment Recommendations       Recommendations for Other Services       Precautions / Restrictions Precautions Precautions: Fall Recall of Precautions/Restrictions: Intact Restrictions Weight Bearing Restrictions Per Provider Order: No     Mobility   Bed Mobility               General bed mobility comments: Pt in chair upon entrance    Transfers Overall transfer level: Needs assistance Equipment used: Rolling walker (2 wheels) Transfers: Sit to/from Stand Sit to Stand: Min assist, Mod assist           General transfer comment: Cueing for handplacement to assist to standing from chair, used RW for stability upon standing, BLE weakness presents    Ambulation/Gait Ambulation/Gait assistance: Min assist Gait Distance (Feet): 10 Feet Assistive device: Rolling walker (2 wheels) Gait Pattern/deviations: Trunk flexed, Decreased step length - right, Decreased step length - left, Decreased stride length Gait velocity: slow     General Gait Details: labored steps at bedside limited by SOB and decreased O2 saturation   Stairs             Wheelchair Mobility     Tilt Bed    Modified Rankin (Stroke Patients Only)       Balance                                            Communication    Cognition Arousal: Alert Behavior During Therapy: WFL for tasks assessed/performed   PT - Cognitive impairments: No apparent impairments  Cueing    Exercises General Exercises - Lower Extremity Ankle Circles/Pumps: AROM, 10 reps, Seated Long Arc Quad: AROM, 10 reps, Seated Hip ABduction/ADduction: AROM, 10 reps, Seated Hip Flexion/Marching: AROM, 10 reps, Seated    General Comments        Pertinent Vitals/Pain Pain Assessment Pain Score: 6  Pain Location: Lt LE Pain Descriptors / Indicators: Aching, Discomfort Pain Intervention(s): Premedicated before session, Monitored during session, Limited activity within patient's tolerance, Repositioned    Home Living                          Prior Function            PT Goals (current goals can now be found in the care plan section)      Frequency           PT Plan       Co-evaluation              AM-PAC PT 6 Clicks Mobility   Outcome Measure  Help needed turning from your back to your side while in a flat bed without using bedrails?: A Little Help needed moving from lying on your back to sitting on the side of a flat bed without using bedrails?: A Lot Help needed moving to and from a bed to a chair (including a wheelchair)?: A Lot Help needed standing up from a chair using your arms (e.g., wheelchair or bedside chair)?: A Lot Help needed to walk in hospital room?: A Lot Help needed climbing 3-5 steps with a railing? : A Lot 6 Click Score: 13    End of Session Equipment Utilized During Treatment: Gait belt;Oxygen  Activity Tolerance: Patient limited by fatigue Patient left: in chair;with call bell/phone within reach Nurse Communication: Mobility status PT Visit Diagnosis: Muscle weakness (generalized) (M62.81);Unsteadiness on feet (R26.81);Other abnormalities of gait and mobility (R26.89)     Time: 9044-8984 PT Time Calculation (min) (ACUTE ONLY): 20 min  Charges:    $Therapeutic Activity: 8-22 mins PT General Charges $$ ACUTE PT VISIT: 1 Visit                     Augustin Mclean, LPTA/CLT; CBIS [phone]   Mclean Augustin Amble 02/06/2024, 11:10 AM

## 2024-02-06 NOTE — Telephone Encounter (Signed)
 Thanks, Dena.  We can repeat a CBC next week around Wednesday.

## 2024-02-06 NOTE — Care Management Important Message (Signed)
 Important Message  Patient Details  Name: Tristan Brooks MRN: [id number] Date of Birth: [birth year]   Important Message Given:  Yes - Medicare IM     Janeene Sand L Tonya Wantz 02/06/2024, 10:59 AM
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
## 2024-02-06 NOTE — Inpatient Diabetes Management (Signed)
 Inpatient Diabetes Program Recommendations  AACE/ADA: New Consensus Statement on Inpatient Glycemic Control   Target Ranges:  Prepandial:   less than 140 mg/dL      Peak postprandial:   less than 180 mg/dL (1-2 hours)      Critically ill patients:  140 - 180 mg/dL    Latest Reference Range & Units 02/05/24 07:46 02/05/24 11:06 02/05/24 16:22 02/05/24 19:34 02/06/24 07:11  Glucose-Capillary 70 - 99 mg/dL 823 (H) 718 (H) 785 (H) 250 (H) 110 (H)   Review of Glycemic Control  Diabetes history: DM2 Outpatient Diabetes medications: Glipizide XL 10 mg daily, Actos 30 mg daily Current orders for Inpatient glycemic control: Semglee  45 units BID, Novolog  0-9 units TID with meals, Novolog  0-5 units at bedtime; Prednisone  20 mg BID   Inpatient Diabetes Program Recommendations:     Insulin : If steroids are continued, please consider ordering Novolog  4 units TID with meals for meal coverage if patient eats at least 50% of meals.   Outpatient DM: Due to hx of heart failure, would recommend stopping Actos.  Will likely need to discharge to SNF on insulin  regimen.    Thanks, Earnie Gainer, RN, MSN, CDCES Diabetes Coordinator Inpatient Diabetes Program [phone] (Team Pager from 8am to 5pm)

## 2024-02-07 ENCOUNTER — Other Ambulatory Visit: Payer: Self-pay

## 2024-02-07 DIAGNOSIS — K922 Gastrointestinal hemorrhage, unspecified: Secondary | ICD-10-CM

## 2024-02-07 NOTE — Care Plan (Signed)
 [facility] AND [facility] [address] [place] [address] [phone]  Physician Medicare Certification Post-Hospital SNF Care  Patient Name: Tristan Brooks MRN: [id number] Medicare Number: [id number] - (Medicare Advantage) Attending Provider: Toribio Jerel Area, MD Admitting Provider: Jerel Area Toribio, MD Admission Date: 02/06/2024  I certify that post-hospital SNF care is required at a skilled level on a daily basis on behalf of the above named patient that, as a practical matter, can only be provided in a SNF.  The SNF care is needed for a condition for which the individual received inpatient care in a participating hospital.  Additional justification (if applicable): Admitted for skilled rehabilitation post hospitalization with primary diagnosis of GI Bleed. The SNF care is needed for increased strengthening and endurance.   Skilled Rehab: yes. If yes, as documented per rehab plan of care.  Provider's Name: Jerel Area Toribio, MD Date: 02/07/2024 5:21 PM

## 2024-02-07 NOTE — Telephone Encounter (Signed)
 Labs (CBC) put in the system for the pt. Put labs along with a note in the mail today so pt will receive it in time. Pt also advised to take the form with him.

## 2024-02-17 DIAGNOSIS — E1122 Type 2 diabetes mellitus with diabetic chronic kidney disease: Secondary | ICD-10-CM | POA: Diagnosis not present

## 2024-02-17 DIAGNOSIS — J9611 Chronic respiratory failure with hypoxia: Secondary | ICD-10-CM | POA: Diagnosis not present

## 2024-02-17 DIAGNOSIS — J441 Chronic obstructive pulmonary disease with (acute) exacerbation: Secondary | ICD-10-CM | POA: Diagnosis not present

## 2024-02-17 DIAGNOSIS — K5791 Diverticulosis of intestine, part unspecified, without perforation or abscess with bleeding: Secondary | ICD-10-CM | POA: Diagnosis not present

## 2024-02-17 DIAGNOSIS — I10 Essential (primary) hypertension: Secondary | ICD-10-CM | POA: Diagnosis not present

## 2024-02-26 DIAGNOSIS — D62 Acute posthemorrhagic anemia: Secondary | ICD-10-CM | POA: Diagnosis not present

## 2024-02-26 DIAGNOSIS — J449 Chronic obstructive pulmonary disease, unspecified: Secondary | ICD-10-CM | POA: Diagnosis not present

## 2024-02-26 DIAGNOSIS — I10 Essential (primary) hypertension: Secondary | ICD-10-CM | POA: Diagnosis not present

## 2024-02-26 DIAGNOSIS — K5791 Diverticulosis of intestine, part unspecified, without perforation or abscess with bleeding: Secondary | ICD-10-CM | POA: Diagnosis not present

## 2024-02-26 DIAGNOSIS — J9611 Chronic respiratory failure with hypoxia: Secondary | ICD-10-CM | POA: Diagnosis not present

## 2024-02-26 DIAGNOSIS — J9601 Acute respiratory failure with hypoxia: Secondary | ICD-10-CM | POA: Diagnosis not present

## 2024-02-26 DIAGNOSIS — J430 Unilateral pulmonary emphysema [MacLeod's syndrome]: Secondary | ICD-10-CM | POA: Diagnosis not present

## 2024-02-26 DIAGNOSIS — E1122 Type 2 diabetes mellitus with diabetic chronic kidney disease: Secondary | ICD-10-CM | POA: Diagnosis not present

## 2024-02-26 DIAGNOSIS — I129 Hypertensive chronic kidney disease with stage 1 through stage 4 chronic kidney disease, or unspecified chronic kidney disease: Secondary | ICD-10-CM | POA: Diagnosis not present

## 2024-02-26 DIAGNOSIS — R54 Age-related physical debility: Secondary | ICD-10-CM | POA: Diagnosis not present

## 2024-02-27 DIAGNOSIS — J9611 Chronic respiratory failure with hypoxia: Secondary | ICD-10-CM | POA: Diagnosis not present

## 2024-02-27 DIAGNOSIS — I080 Rheumatic disorders of both mitral and aortic valves: Secondary | ICD-10-CM | POA: Diagnosis not present

## 2024-02-27 DIAGNOSIS — N1831 Chronic kidney disease, stage 3a: Secondary | ICD-10-CM | POA: Diagnosis not present

## 2024-02-27 DIAGNOSIS — F17210 Nicotine dependence, cigarettes, uncomplicated: Secondary | ICD-10-CM | POA: Diagnosis not present

## 2024-02-27 DIAGNOSIS — Z9981 Dependence on supplemental oxygen: Secondary | ICD-10-CM | POA: Diagnosis not present

## 2024-02-27 DIAGNOSIS — I110 Hypertensive heart disease with heart failure: Secondary | ICD-10-CM | POA: Diagnosis not present

## 2024-02-27 DIAGNOSIS — D62 Acute posthemorrhagic anemia: Secondary | ICD-10-CM | POA: Diagnosis not present

## 2024-02-27 DIAGNOSIS — Z7984 Long term (current) use of oral hypoglycemic drugs: Secondary | ICD-10-CM | POA: Diagnosis not present

## 2024-02-27 DIAGNOSIS — Z9181 History of falling: Secondary | ICD-10-CM | POA: Diagnosis not present

## 2024-02-27 DIAGNOSIS — D0220 Carcinoma in situ of unspecified bronchus and lung: Secondary | ICD-10-CM | POA: Diagnosis not present

## 2024-02-27 DIAGNOSIS — N1832 Chronic kidney disease, stage 3b: Secondary | ICD-10-CM | POA: Diagnosis not present

## 2024-02-27 DIAGNOSIS — Z7982 Long term (current) use of aspirin: Secondary | ICD-10-CM | POA: Diagnosis not present

## 2024-02-27 DIAGNOSIS — F331 Major depressive disorder, recurrent, moderate: Secondary | ICD-10-CM | POA: Diagnosis not present

## 2024-02-27 DIAGNOSIS — K922 Gastrointestinal hemorrhage, unspecified: Secondary | ICD-10-CM | POA: Diagnosis not present

## 2024-02-27 DIAGNOSIS — E1122 Type 2 diabetes mellitus with diabetic chronic kidney disease: Secondary | ICD-10-CM | POA: Diagnosis not present

## 2024-02-27 DIAGNOSIS — J441 Chronic obstructive pulmonary disease with (acute) exacerbation: Secondary | ICD-10-CM | POA: Diagnosis not present

## 2024-02-27 DIAGNOSIS — I5033 Acute on chronic diastolic (congestive) heart failure: Secondary | ICD-10-CM | POA: Diagnosis not present

## 2024-02-28 ENCOUNTER — Telehealth: Payer: Self-pay

## 2024-02-28 DIAGNOSIS — I10 Essential (primary) hypertension: Secondary | ICD-10-CM

## 2024-02-28 DIAGNOSIS — E119 Type 2 diabetes mellitus without complications: Secondary | ICD-10-CM

## 2024-02-28 DIAGNOSIS — J449 Chronic obstructive pulmonary disease, unspecified: Secondary | ICD-10-CM

## 2024-02-28 NOTE — Progress Notes (Signed)
..  Complex Care Management   02/28/2024  Name: Tristan Brooks  MRN: [id number]  DOB: [birth year]  Inbound call received from [name] after receiving an Interactive Voice Response (IVR) call after a recent hospitalization. Information was provided about Care Management services.  Follow up plan: Referral placed for Complex Care Management services.   [name], [name]  [facility], [facility] Health Care Management Assistant [phone]

## 2024-02-29 ENCOUNTER — Telehealth: Payer: Self-pay

## 2024-02-29 NOTE — Progress Notes (Signed)
 Complex Care Management Note Care Guide Note  02/29/2024 Name: Tristan Brooks MRN: [id number] DOB: 1940-07-28   Complex Care Management Outreach Attempts: An unsuccessful telephone outreach was attempted today to offer the patient information about available complex care management services.  Follow Up Plan:  Additional outreach attempts will be made to offer the patient complex care management information and services.   Encounter Outcome:  No Answer  Jeoffrey Buffalo , RMA     [place]  [facility], [facility] Guide  Direct Dial: [phone]  Website: [email or web address]

## 2024-03-01 ENCOUNTER — Other Ambulatory Visit: Payer: Self-pay

## 2024-03-01 ENCOUNTER — Telehealth: Payer: Self-pay

## 2024-03-01 DIAGNOSIS — Z7984 Long term (current) use of oral hypoglycemic drugs: Secondary | ICD-10-CM | POA: Diagnosis not present

## 2024-03-01 DIAGNOSIS — J9611 Chronic respiratory failure with hypoxia: Secondary | ICD-10-CM | POA: Diagnosis not present

## 2024-03-01 DIAGNOSIS — J441 Chronic obstructive pulmonary disease with (acute) exacerbation: Secondary | ICD-10-CM | POA: Diagnosis not present

## 2024-03-01 DIAGNOSIS — Z9181 History of falling: Secondary | ICD-10-CM | POA: Diagnosis not present

## 2024-03-01 DIAGNOSIS — D0220 Carcinoma in situ of unspecified bronchus and lung: Secondary | ICD-10-CM | POA: Diagnosis not present

## 2024-03-01 DIAGNOSIS — N1831 Chronic kidney disease, stage 3a: Secondary | ICD-10-CM | POA: Diagnosis not present

## 2024-03-01 DIAGNOSIS — D62 Acute posthemorrhagic anemia: Secondary | ICD-10-CM | POA: Diagnosis not present

## 2024-03-01 DIAGNOSIS — I080 Rheumatic disorders of both mitral and aortic valves: Secondary | ICD-10-CM | POA: Diagnosis not present

## 2024-03-01 DIAGNOSIS — I110 Hypertensive heart disease with heart failure: Secondary | ICD-10-CM | POA: Diagnosis not present

## 2024-03-01 DIAGNOSIS — Z9981 Dependence on supplemental oxygen: Secondary | ICD-10-CM | POA: Diagnosis not present

## 2024-03-01 DIAGNOSIS — K922 Gastrointestinal hemorrhage, unspecified: Secondary | ICD-10-CM | POA: Diagnosis not present

## 2024-03-01 DIAGNOSIS — F17210 Nicotine dependence, cigarettes, uncomplicated: Secondary | ICD-10-CM | POA: Diagnosis not present

## 2024-03-01 DIAGNOSIS — I5033 Acute on chronic diastolic (congestive) heart failure: Secondary | ICD-10-CM | POA: Diagnosis not present

## 2024-03-01 DIAGNOSIS — E1122 Type 2 diabetes mellitus with diabetic chronic kidney disease: Secondary | ICD-10-CM | POA: Diagnosis not present

## 2024-03-01 DIAGNOSIS — N1832 Chronic kidney disease, stage 3b: Secondary | ICD-10-CM | POA: Diagnosis not present

## 2024-03-01 DIAGNOSIS — Z7982 Long term (current) use of aspirin: Secondary | ICD-10-CM | POA: Diagnosis not present

## 2024-03-01 NOTE — Patient Outreach (Signed)
 Incoming call from Greig Salmon, nurse with [facility].  She reconciled patient's medications, had concerns with SNF ordering insulin  but patient didn't receive at discharge.  Amy has already contacted PCP office, spoke with staff, orders are to take DM PO meds as he was taking before hospitalization and will see PCP for post-SNF office visit next week.

## 2024-03-01 NOTE — Patient Instructions (Signed)
 Visit Information  Thank you for taking time to visit with me today. Please don't hesitate to contact me if I can be of assistance to you before our next scheduled appointment.  Our next appointment is by telephone on Friday, September 19th at 10:00am. Please call the care guide team at [phone] if you need to cancel or reschedule your appointment.   Following is a copy of your care plan:   Goals Addressed             This Visit's Progress    VBCI RN Care Plan       Problems:  Chronic Disease Management support and education needs related to DMII  Goal: Over the next 3 months the Patient will demonstrate Improved adherence to prescribed treatment plan for DMII as evidenced by decrease in A1C with goal of <7.5%.  Over the next four days, patient will have better understanding of ordered medications as evidenced by knowledge of whether he should be taking injectable insulin  or if he should continue with PO DM meds as previously prescribed.   Interventions:   Diabetes Interventions: Assessed patient's understanding of A1c goal: <7.5% Reviewed medications with patient and discussed importance of medication adherence Counseled on importance of regular laboratory monitoring as prescribed Review of patient status, including review of consultants reports, relevant laboratory and other test results, and medications completed Assessed social determinant of health barriers Patient states he was not told to take insulin .  Left voicemail with niece, Dorothe Pollack, to inquire of insulin  administration/is medication in the home?  Lab Results  Component Value Date   HGBA1C 10.1 (H) 02/02/2024    Patient Self-Care Activities:  Attend all scheduled provider appointments Call pharmacy for medication refills 3-7 days in advance of running out of medications Call provider office for new concerns or questions  Take medications as prescribed   Patient will continue to take pioglitazone and  glipdizide until PCP addresses medications at follow up visit.    Plan:  Telephone follow up appointment with care management team member scheduled for:  one week.             A reminder to ALL patients/family/friends, please call the [place]  National Suicide Prevention Lifeline: (423) 745-7000 or TTY: 323-855-2228 TTY 351-688-3178) to talk to a trained counselor if you are experiencing a Mental Health or Behavioral Health Crisis or need someone to talk to.  The patient verbalized understanding of instructions, educational materials, and care plan provided today and DECLINED offer to receive copy of patient instructions, educational materials, and care plan.   Santana Stamp BSN, CCM [place]  VBCI Population Health RN Care Manager Direct Dial: [phone]  Fax: [phone]

## 2024-03-01 NOTE — Patient Outreach (Signed)
 Left voicemail for return call with Amy Lattie who will be seeing Mr. Ausborn today, to address concerns about Novolog  and Lantus  that are on his discharge list as current ordered meds but he doesn't have insulin  in his home, states no one told me to take insulin , he has gone back to taking glipizide 5mg  BID and pioglitazone 30 MG.

## 2024-03-01 NOTE — Patient Outreach (Signed)
 Complex Care Management   Visit Note  03/01/2024  Name:  Tristan Brooks MRN: [id number] DOB: [birth year]  Situation: Referral received for Complex Care Management related to DM/recent GI bleed hospital admission. I obtained verbal consent from Patient.  Visit completed with Tristan Brooks  on the phone.  Patient is currently receiving HHPT/RN/aide with [facility], he expects an aide to visit today for bathing assistance.  As we went over discharged medications, patients states he is taking glipizide and pioglitazone as previously prescribed, he was not told to take insulin  nor does he know how to inject.    Background:   Past Medical History:  Diagnosis Date   Aortic stenosis, severe    CHF (congestive heart failure) (HCC)    CKD (chronic kidney disease)    COPD (chronic obstructive pulmonary disease) (HCC)    Diabetes mellitus without complication (HCC)    HLD (hyperlipidemia)    Mass of right lung    Neuropathy     Assessment: Patient Reported Symptoms:  Cognitive Cognitive Status: Normal speech and language skills      Neurological Neurological Review of Symptoms: No symptoms reported    HEENT HEENT Symptoms Reported: No symptoms reported      Cardiovascular Cardiovascular Symptoms Reported: Swelling in legs or feet (Mild swelling in ankles and feet.)    Respiratory Respiratory Symptoms Reported: No symptoms reported    Endocrine Endocrine Symptoms Reported: No symptoms reported (A1C 10.6% 12/26/2023) Is patient diabetic?: Yes Is patient checking blood sugars at home?: Yes List most recent blood sugar readings, include date and time of day: 120mg /dL this morning fasting. Endocrine Comment: taking blood sugars twice daily, fasting and in the evening.  Reports he is taking Glipizide 5mg  BID and Pioglitazone 30mg  once daily as he was taking this medicine before hospitalization,  States he was not told to take Novolog  and Lantus .  Gastrointestinal Gastrointestinal  Symptoms Reported: No symptoms reported      Genitourinary Genitourinary Symptoms Reported: No symptoms reported    Integumentary Integumentary Symptoms Reported: No symptoms reported Additional Integumentary Details: No sores on his feet, states he needs his toenails cut.    Musculoskeletal Musculoskelatal Symptoms Reviewed: Unsteady gait Additional Musculoskeletal Details: Uses a walker        Psychosocial       Quality of Family Relationships: helpful, involved, supportive Do you feel physically threatened by others?: No    03/01/2024    PHQ2-9 Depression Screening   Little interest or pleasure in doing things Several days  Feeling down, depressed, or hopeless Not at all  PHQ-2 - Total Score 1  Trouble falling or staying asleep, or sleeping too much    Feeling tired or having little energy    Poor appetite or overeating     Feeling bad about yourself - or that you are a failure or have let yourself or your family down    Trouble concentrating on things, such as reading the newspaper or watching television    Moving or speaking so slowly that other people could have noticed.  Or the opposite - being so fidgety or restless that you have been moving around a lot more than usual    Thoughts that you would be better off dead, or hurting yourself in some way    PHQ2-9 Total Score    If you checked off any problems, how difficult have these problems made it for you to do your work, take care of things at home, or get along  with other people    Depression Interventions/Treatment      There were no vitals filed for this visit.  Medications Reviewed Today     Reviewed by Lucian Santana LABOR, RN (Registered Nurse) on 03/01/24 at 1327  Med List Status: <None>   Medication Order Taking? Sig Documenting Provider Last Dose Status Informant  albuterol  (VENTOLIN  HFA) 108 (90 Base) MCG/ACT inhaler [id number]  Inhale 2 puffs into the lungs every 4 (four) hours as needed for shortness of breath  or wheezing. [provider]  Active Pharmacy Records, Self  ANORO ELLIPTA  62.5-25 MCG/ACT AEPB [id number] Yes Inhale 1 puff into the lungs daily. [provider]  Active Pharmacy Records, Self  atorvastatin  (LIPITOR) 20 MG tablet [id number] Yes Take 20 mg by mouth daily. [provider]  Active Pharmacy Records, Self  Cholecalciferol (VITAMIN D-3) 25 MCG (1000 UT) CAPS [id number]  Take 1 capsule by mouth daily. [provider]  Active Pharmacy Records, Self  ezetimibe  (ZETIA ) 10 MG tablet [id number] Yes Take 1 tablet by mouth daily. [provider]  Active Pharmacy Records, Self  Ferrous Sulfate Dried (HIGH POTENCY IRON) 65 MG TABS [id number] Yes Take 1 tablet by mouth daily. [provider]  Active Pharmacy Records, Self  furosemide  (LASIX ) 20 MG tablet [id number] Yes Take 1 tablet (20 mg total) by mouth daily. Alvan Dorn FALCON, MD  Active Pharmacy Records, Self  gabapentin  (NEURONTIN ) 100 MG capsule [id number] Yes Take 100 mg by mouth 2 (two) times daily. [provider]  Active Pharmacy Records, Self  insulin  aspart (NOVOLOG ) 100 UNIT/ML FlexPen [id number]  Inject 3 Units into the skin 3 (three) times daily with meals.  Patient not taking: Reported on 03/01/2024   Sira, Zackery, MD  Active   insulin  glargine-yfgn (SEMGLEE ) 100 UNIT/ML injection [id number]  Inject 0.45 mLs (45 Units total) into the skin 2 (two) times daily.  Patient not taking: Reported on 03/01/2024   Sira, Zackery, MD  Active   mirtazapine  (REMERON ) 15 MG tablet [id number] Yes Take 15 mg by mouth at bedtime. [provider]  Active Pharmacy Records, Self  Misc. Devices (PULSE OXIMETER FOR FINGER) MISC [id number]  Use pulse oximeter as need Alvan Dorn FALCON, MD  Active Pharmacy Records, Self  omeprazole (PRILOSEC) 40 MG capsule [id number] Yes Take 40 mg by mouth daily. [provider]  Active   [facility] VERIO test strip [id number] Yes 1 each by Other route as  needed for other. [provider]  Active Pharmacy Records, Self  pantoprazole  (PROTONIX ) 40 MG tablet [id number]  Take 1 tablet (40 mg total) by mouth daily.  Patient not taking: Reported on 03/01/2024   Sira, Zackery, MD  Active             Recommendation:   Acute PCP follow-up patient reports he believes he has an appointment scheduled for post SNF follow up on 03/05/24. This RNCM left voicemail with niece, Dorothe Pollack and with [facility], Greig Salmon (at [phone]) to inquire of correct DM medications, if patient should be taking insulin  or should he be taking glipizide 5mg  BID and Pioglitazone 30mg  QD   Follow Up Plan:   Telephone follow-up in 1 week  Santana Lucian BSN, CCM [place]  [facility] Population Health RN Care Manager Direct Dial: [phone]  Fax: [phone]

## 2024-03-06 DIAGNOSIS — F331 Major depressive disorder, recurrent, moderate: Secondary | ICD-10-CM | POA: Diagnosis not present

## 2024-03-06 DIAGNOSIS — K922 Gastrointestinal hemorrhage, unspecified: Secondary | ICD-10-CM | POA: Diagnosis not present

## 2024-03-06 DIAGNOSIS — Z7984 Long term (current) use of oral hypoglycemic drugs: Secondary | ICD-10-CM | POA: Diagnosis not present

## 2024-03-06 DIAGNOSIS — Z7982 Long term (current) use of aspirin: Secondary | ICD-10-CM | POA: Diagnosis not present

## 2024-03-06 DIAGNOSIS — E1122 Type 2 diabetes mellitus with diabetic chronic kidney disease: Secondary | ICD-10-CM | POA: Diagnosis not present

## 2024-03-06 DIAGNOSIS — Z9181 History of falling: Secondary | ICD-10-CM | POA: Diagnosis not present

## 2024-03-06 DIAGNOSIS — J441 Chronic obstructive pulmonary disease with (acute) exacerbation: Secondary | ICD-10-CM | POA: Diagnosis not present

## 2024-03-06 DIAGNOSIS — Z9981 Dependence on supplemental oxygen: Secondary | ICD-10-CM | POA: Diagnosis not present

## 2024-03-06 DIAGNOSIS — D62 Acute posthemorrhagic anemia: Secondary | ICD-10-CM | POA: Diagnosis not present

## 2024-03-06 DIAGNOSIS — I110 Hypertensive heart disease with heart failure: Secondary | ICD-10-CM | POA: Diagnosis not present

## 2024-03-06 DIAGNOSIS — D0220 Carcinoma in situ of unspecified bronchus and lung: Secondary | ICD-10-CM | POA: Diagnosis not present

## 2024-03-06 DIAGNOSIS — I080 Rheumatic disorders of both mitral and aortic valves: Secondary | ICD-10-CM | POA: Diagnosis not present

## 2024-03-06 DIAGNOSIS — I5033 Acute on chronic diastolic (congestive) heart failure: Secondary | ICD-10-CM | POA: Diagnosis not present

## 2024-03-06 DIAGNOSIS — N1831 Chronic kidney disease, stage 3a: Secondary | ICD-10-CM | POA: Diagnosis not present

## 2024-03-06 DIAGNOSIS — J9611 Chronic respiratory failure with hypoxia: Secondary | ICD-10-CM | POA: Diagnosis not present

## 2024-03-06 DIAGNOSIS — N1832 Chronic kidney disease, stage 3b: Secondary | ICD-10-CM | POA: Diagnosis not present

## 2024-03-06 DIAGNOSIS — F17210 Nicotine dependence, cigarettes, uncomplicated: Secondary | ICD-10-CM | POA: Diagnosis not present

## 2024-03-07 DIAGNOSIS — D0220 Carcinoma in situ of unspecified bronchus and lung: Secondary | ICD-10-CM | POA: Diagnosis not present

## 2024-03-07 DIAGNOSIS — F331 Major depressive disorder, recurrent, moderate: Secondary | ICD-10-CM | POA: Diagnosis not present

## 2024-03-07 DIAGNOSIS — K922 Gastrointestinal hemorrhage, unspecified: Secondary | ICD-10-CM | POA: Diagnosis not present

## 2024-03-07 DIAGNOSIS — I5033 Acute on chronic diastolic (congestive) heart failure: Secondary | ICD-10-CM | POA: Diagnosis not present

## 2024-03-07 DIAGNOSIS — F17210 Nicotine dependence, cigarettes, uncomplicated: Secondary | ICD-10-CM | POA: Diagnosis not present

## 2024-03-07 DIAGNOSIS — J441 Chronic obstructive pulmonary disease with (acute) exacerbation: Secondary | ICD-10-CM | POA: Diagnosis not present

## 2024-03-07 DIAGNOSIS — I080 Rheumatic disorders of both mitral and aortic valves: Secondary | ICD-10-CM | POA: Diagnosis not present

## 2024-03-07 DIAGNOSIS — Z7984 Long term (current) use of oral hypoglycemic drugs: Secondary | ICD-10-CM | POA: Diagnosis not present

## 2024-03-07 DIAGNOSIS — N1831 Chronic kidney disease, stage 3a: Secondary | ICD-10-CM | POA: Diagnosis not present

## 2024-03-07 DIAGNOSIS — Z7982 Long term (current) use of aspirin: Secondary | ICD-10-CM | POA: Diagnosis not present

## 2024-03-07 DIAGNOSIS — D62 Acute posthemorrhagic anemia: Secondary | ICD-10-CM | POA: Diagnosis not present

## 2024-03-07 DIAGNOSIS — N1832 Chronic kidney disease, stage 3b: Secondary | ICD-10-CM | POA: Diagnosis not present

## 2024-03-07 DIAGNOSIS — Z9981 Dependence on supplemental oxygen: Secondary | ICD-10-CM | POA: Diagnosis not present

## 2024-03-07 DIAGNOSIS — I110 Hypertensive heart disease with heart failure: Secondary | ICD-10-CM | POA: Diagnosis not present

## 2024-03-07 DIAGNOSIS — E1122 Type 2 diabetes mellitus with diabetic chronic kidney disease: Secondary | ICD-10-CM | POA: Diagnosis not present

## 2024-03-07 DIAGNOSIS — Z9181 History of falling: Secondary | ICD-10-CM | POA: Diagnosis not present

## 2024-03-07 DIAGNOSIS — J9611 Chronic respiratory failure with hypoxia: Secondary | ICD-10-CM | POA: Diagnosis not present

## 2024-03-08 ENCOUNTER — Telehealth: Payer: Self-pay

## 2024-03-11 DIAGNOSIS — N1832 Chronic kidney disease, stage 3b: Secondary | ICD-10-CM | POA: Diagnosis not present

## 2024-03-11 DIAGNOSIS — F17210 Nicotine dependence, cigarettes, uncomplicated: Secondary | ICD-10-CM | POA: Diagnosis not present

## 2024-03-11 DIAGNOSIS — D62 Acute posthemorrhagic anemia: Secondary | ICD-10-CM | POA: Diagnosis not present

## 2024-03-11 DIAGNOSIS — N1831 Chronic kidney disease, stage 3a: Secondary | ICD-10-CM | POA: Diagnosis not present

## 2024-03-11 DIAGNOSIS — Z9181 History of falling: Secondary | ICD-10-CM | POA: Diagnosis not present

## 2024-03-11 DIAGNOSIS — D0220 Carcinoma in situ of unspecified bronchus and lung: Secondary | ICD-10-CM | POA: Diagnosis not present

## 2024-03-11 DIAGNOSIS — I110 Hypertensive heart disease with heart failure: Secondary | ICD-10-CM | POA: Diagnosis not present

## 2024-03-11 DIAGNOSIS — Z7984 Long term (current) use of oral hypoglycemic drugs: Secondary | ICD-10-CM | POA: Diagnosis not present

## 2024-03-11 DIAGNOSIS — J441 Chronic obstructive pulmonary disease with (acute) exacerbation: Secondary | ICD-10-CM | POA: Diagnosis not present

## 2024-03-11 DIAGNOSIS — Z7982 Long term (current) use of aspirin: Secondary | ICD-10-CM | POA: Diagnosis not present

## 2024-03-11 DIAGNOSIS — J9611 Chronic respiratory failure with hypoxia: Secondary | ICD-10-CM | POA: Diagnosis not present

## 2024-03-11 DIAGNOSIS — I080 Rheumatic disorders of both mitral and aortic valves: Secondary | ICD-10-CM | POA: Diagnosis not present

## 2024-03-11 DIAGNOSIS — E1122 Type 2 diabetes mellitus with diabetic chronic kidney disease: Secondary | ICD-10-CM | POA: Diagnosis not present

## 2024-03-11 DIAGNOSIS — F331 Major depressive disorder, recurrent, moderate: Secondary | ICD-10-CM | POA: Diagnosis not present

## 2024-03-11 DIAGNOSIS — K922 Gastrointestinal hemorrhage, unspecified: Secondary | ICD-10-CM | POA: Diagnosis not present

## 2024-03-11 DIAGNOSIS — I5033 Acute on chronic diastolic (congestive) heart failure: Secondary | ICD-10-CM | POA: Diagnosis not present

## 2024-03-13 DIAGNOSIS — Z9181 History of falling: Secondary | ICD-10-CM | POA: Diagnosis not present

## 2024-03-13 DIAGNOSIS — E1122 Type 2 diabetes mellitus with diabetic chronic kidney disease: Secondary | ICD-10-CM | POA: Diagnosis not present

## 2024-03-13 DIAGNOSIS — I080 Rheumatic disorders of both mitral and aortic valves: Secondary | ICD-10-CM | POA: Diagnosis not present

## 2024-03-13 DIAGNOSIS — N1831 Chronic kidney disease, stage 3a: Secondary | ICD-10-CM | POA: Diagnosis not present

## 2024-03-13 DIAGNOSIS — N1832 Chronic kidney disease, stage 3b: Secondary | ICD-10-CM | POA: Diagnosis not present

## 2024-03-13 DIAGNOSIS — F17210 Nicotine dependence, cigarettes, uncomplicated: Secondary | ICD-10-CM | POA: Diagnosis not present

## 2024-03-13 DIAGNOSIS — D0220 Carcinoma in situ of unspecified bronchus and lung: Secondary | ICD-10-CM | POA: Diagnosis not present

## 2024-03-13 DIAGNOSIS — Z7982 Long term (current) use of aspirin: Secondary | ICD-10-CM | POA: Diagnosis not present

## 2024-03-13 DIAGNOSIS — I110 Hypertensive heart disease with heart failure: Secondary | ICD-10-CM | POA: Diagnosis not present

## 2024-03-13 DIAGNOSIS — Z9981 Dependence on supplemental oxygen: Secondary | ICD-10-CM | POA: Diagnosis not present

## 2024-03-13 DIAGNOSIS — I5033 Acute on chronic diastolic (congestive) heart failure: Secondary | ICD-10-CM | POA: Diagnosis not present

## 2024-03-13 DIAGNOSIS — D62 Acute posthemorrhagic anemia: Secondary | ICD-10-CM | POA: Diagnosis not present

## 2024-03-13 DIAGNOSIS — Z7984 Long term (current) use of oral hypoglycemic drugs: Secondary | ICD-10-CM | POA: Diagnosis not present

## 2024-03-13 DIAGNOSIS — K922 Gastrointestinal hemorrhage, unspecified: Secondary | ICD-10-CM | POA: Diagnosis not present

## 2024-03-13 DIAGNOSIS — J441 Chronic obstructive pulmonary disease with (acute) exacerbation: Secondary | ICD-10-CM | POA: Diagnosis not present

## 2024-03-13 DIAGNOSIS — F331 Major depressive disorder, recurrent, moderate: Secondary | ICD-10-CM | POA: Diagnosis not present

## 2024-03-14 DIAGNOSIS — R918 Other nonspecific abnormal finding of lung field: Secondary | ICD-10-CM | POA: Diagnosis not present

## 2024-03-20 DIAGNOSIS — Z9981 Dependence on supplemental oxygen: Secondary | ICD-10-CM | POA: Diagnosis not present

## 2024-03-20 DIAGNOSIS — I080 Rheumatic disorders of both mitral and aortic valves: Secondary | ICD-10-CM | POA: Diagnosis not present

## 2024-03-20 DIAGNOSIS — E1122 Type 2 diabetes mellitus with diabetic chronic kidney disease: Secondary | ICD-10-CM | POA: Diagnosis not present

## 2024-03-20 DIAGNOSIS — F17210 Nicotine dependence, cigarettes, uncomplicated: Secondary | ICD-10-CM | POA: Diagnosis not present

## 2024-03-20 DIAGNOSIS — N1831 Chronic kidney disease, stage 3a: Secondary | ICD-10-CM | POA: Diagnosis not present

## 2024-03-20 DIAGNOSIS — D62 Acute posthemorrhagic anemia: Secondary | ICD-10-CM | POA: Diagnosis not present

## 2024-03-20 DIAGNOSIS — J441 Chronic obstructive pulmonary disease with (acute) exacerbation: Secondary | ICD-10-CM | POA: Diagnosis not present

## 2024-03-20 DIAGNOSIS — D0220 Carcinoma in situ of unspecified bronchus and lung: Secondary | ICD-10-CM | POA: Diagnosis not present

## 2024-03-20 DIAGNOSIS — K922 Gastrointestinal hemorrhage, unspecified: Secondary | ICD-10-CM | POA: Diagnosis not present

## 2024-03-20 DIAGNOSIS — F331 Major depressive disorder, recurrent, moderate: Secondary | ICD-10-CM | POA: Diagnosis not present

## 2024-03-20 DIAGNOSIS — Z7984 Long term (current) use of oral hypoglycemic drugs: Secondary | ICD-10-CM | POA: Diagnosis not present

## 2024-03-20 DIAGNOSIS — J9611 Chronic respiratory failure with hypoxia: Secondary | ICD-10-CM | POA: Diagnosis not present

## 2024-03-20 DIAGNOSIS — I110 Hypertensive heart disease with heart failure: Secondary | ICD-10-CM | POA: Diagnosis not present

## 2024-03-20 DIAGNOSIS — Z9181 History of falling: Secondary | ICD-10-CM | POA: Diagnosis not present

## 2024-03-20 DIAGNOSIS — N1832 Chronic kidney disease, stage 3b: Secondary | ICD-10-CM | POA: Diagnosis not present

## 2024-03-20 DIAGNOSIS — I5033 Acute on chronic diastolic (congestive) heart failure: Secondary | ICD-10-CM | POA: Diagnosis not present

## 2024-03-21 DIAGNOSIS — Z9181 History of falling: Secondary | ICD-10-CM | POA: Diagnosis not present

## 2024-03-21 DIAGNOSIS — Z7984 Long term (current) use of oral hypoglycemic drugs: Secondary | ICD-10-CM | POA: Diagnosis not present

## 2024-03-21 DIAGNOSIS — F331 Major depressive disorder, recurrent, moderate: Secondary | ICD-10-CM | POA: Diagnosis not present

## 2024-03-21 DIAGNOSIS — D62 Acute posthemorrhagic anemia: Secondary | ICD-10-CM | POA: Diagnosis not present

## 2024-03-21 DIAGNOSIS — J9611 Chronic respiratory failure with hypoxia: Secondary | ICD-10-CM | POA: Diagnosis not present

## 2024-03-21 DIAGNOSIS — I110 Hypertensive heart disease with heart failure: Secondary | ICD-10-CM | POA: Diagnosis not present

## 2024-03-21 DIAGNOSIS — N1831 Chronic kidney disease, stage 3a: Secondary | ICD-10-CM | POA: Diagnosis not present

## 2024-03-21 DIAGNOSIS — F17210 Nicotine dependence, cigarettes, uncomplicated: Secondary | ICD-10-CM | POA: Diagnosis not present

## 2024-03-21 DIAGNOSIS — E1122 Type 2 diabetes mellitus with diabetic chronic kidney disease: Secondary | ICD-10-CM | POA: Diagnosis not present

## 2024-03-21 DIAGNOSIS — K922 Gastrointestinal hemorrhage, unspecified: Secondary | ICD-10-CM | POA: Diagnosis not present

## 2024-03-21 DIAGNOSIS — Z7982 Long term (current) use of aspirin: Secondary | ICD-10-CM | POA: Diagnosis not present

## 2024-03-21 DIAGNOSIS — D0220 Carcinoma in situ of unspecified bronchus and lung: Secondary | ICD-10-CM | POA: Diagnosis not present

## 2024-03-21 DIAGNOSIS — I080 Rheumatic disorders of both mitral and aortic valves: Secondary | ICD-10-CM | POA: Diagnosis not present

## 2024-03-21 DIAGNOSIS — Z9981 Dependence on supplemental oxygen: Secondary | ICD-10-CM | POA: Diagnosis not present

## 2024-03-21 DIAGNOSIS — J441 Chronic obstructive pulmonary disease with (acute) exacerbation: Secondary | ICD-10-CM | POA: Diagnosis not present

## 2024-03-21 DIAGNOSIS — N1832 Chronic kidney disease, stage 3b: Secondary | ICD-10-CM | POA: Diagnosis not present

## 2024-03-21 DIAGNOSIS — I5033 Acute on chronic diastolic (congestive) heart failure: Secondary | ICD-10-CM | POA: Diagnosis not present

## 2024-03-25 ENCOUNTER — Other Ambulatory Visit: Payer: Self-pay

## 2024-03-26 ENCOUNTER — Telehealth: Payer: Self-pay

## 2024-03-26 DIAGNOSIS — K922 Gastrointestinal hemorrhage, unspecified: Secondary | ICD-10-CM | POA: Diagnosis not present

## 2024-03-26 DIAGNOSIS — Z7984 Long term (current) use of oral hypoglycemic drugs: Secondary | ICD-10-CM | POA: Diagnosis not present

## 2024-03-26 DIAGNOSIS — F331 Major depressive disorder, recurrent, moderate: Secondary | ICD-10-CM | POA: Diagnosis not present

## 2024-03-26 DIAGNOSIS — I5033 Acute on chronic diastolic (congestive) heart failure: Secondary | ICD-10-CM | POA: Diagnosis not present

## 2024-03-26 DIAGNOSIS — N1832 Chronic kidney disease, stage 3b: Secondary | ICD-10-CM | POA: Diagnosis not present

## 2024-03-26 DIAGNOSIS — Z7982 Long term (current) use of aspirin: Secondary | ICD-10-CM | POA: Diagnosis not present

## 2024-03-26 DIAGNOSIS — D62 Acute posthemorrhagic anemia: Secondary | ICD-10-CM | POA: Diagnosis not present

## 2024-03-26 DIAGNOSIS — D0220 Carcinoma in situ of unspecified bronchus and lung: Secondary | ICD-10-CM | POA: Diagnosis not present

## 2024-03-26 DIAGNOSIS — I110 Hypertensive heart disease with heart failure: Secondary | ICD-10-CM | POA: Diagnosis not present

## 2024-03-26 DIAGNOSIS — Z9981 Dependence on supplemental oxygen: Secondary | ICD-10-CM | POA: Diagnosis not present

## 2024-03-26 DIAGNOSIS — J441 Chronic obstructive pulmonary disease with (acute) exacerbation: Secondary | ICD-10-CM | POA: Diagnosis not present

## 2024-03-26 DIAGNOSIS — E1122 Type 2 diabetes mellitus with diabetic chronic kidney disease: Secondary | ICD-10-CM | POA: Diagnosis not present

## 2024-03-26 DIAGNOSIS — Z9181 History of falling: Secondary | ICD-10-CM | POA: Diagnosis not present

## 2024-03-26 DIAGNOSIS — I080 Rheumatic disorders of both mitral and aortic valves: Secondary | ICD-10-CM | POA: Diagnosis not present

## 2024-03-26 DIAGNOSIS — F17210 Nicotine dependence, cigarettes, uncomplicated: Secondary | ICD-10-CM | POA: Diagnosis not present

## 2024-03-26 DIAGNOSIS — J9611 Chronic respiratory failure with hypoxia: Secondary | ICD-10-CM | POA: Diagnosis not present

## 2024-03-26 DIAGNOSIS — N1831 Chronic kidney disease, stage 3a: Secondary | ICD-10-CM | POA: Diagnosis not present

## 2024-03-26 NOTE — Patient Outreach (Addendum)
 Complex Care Management   Visit Note  03/26/2024  Name:  CRAVEN CREAN MRN: [id number] DOB: 12-Apr-1941  Situation: Referral received for Complex Care Management related to COPD and Diabetes with Complications I obtained verbal consent from Patient.  Visit completed with Mr. Leatham  on the phone.  Mr. Sitzman states he was seeing an eye MD in the past but needs a new MD that is in-network, needs referral to a podiatrist.   Background:   Past Medical History:  Diagnosis Date   Aortic stenosis, severe    CHF (congestive heart failure) (HCC)    CKD (chronic kidney disease)    COPD (chronic obstructive pulmonary disease) (HCC)    Diabetes mellitus without complication (HCC)    HLD (hyperlipidemia)    Mass of right lung    Neuropathy     Assessment: Patient Reported Symptoms:  Cognitive Cognitive Status: Normal speech and language skills      Neurological Neurological Review of Symptoms: No symptoms reported    HEENT HEENT Symptoms Reported: Other: HEENT Comment: film over right eye, was going to get a procedure but eye MD in [place]  said he had to get a [employer] MD in order for health insurance to cover.    Cardiovascular Cardiovascular Symptoms Reported: Swelling in legs or feet (Swelling in bilateral ankles)    Respiratory Respiratory Symptoms Reported: No symptoms reported    Endocrine Is patient diabetic?: Yes Endocrine Comment: FBG 140mg /dL this monring.  Gastrointestinal Gastrointestinal Symptoms Reported: No symptoms reported      Genitourinary Genitourinary Symptoms Reported: No symptoms reported    Integumentary Integumentary Symptoms Reported: No symptoms reported    Musculoskeletal Musculoskelatal Symptoms Reviewed: No symptoms reported   Falls in the past year?: No Patient at Risk for Falls Due to: History of fall(s)  Psychosocial Psychosocial Symptoms Reported: No symptoms reported          03/26/2024    PHQ2-9 Depression Screening   Little interest  or pleasure in doing things    Feeling down, depressed, or hopeless    PHQ-2 - Total Score    Trouble falling or staying asleep, or sleeping too much    Feeling tired or having little energy    Poor appetite or overeating     Feeling bad about yourself - or that you are a failure or have let yourself or your family down    Trouble concentrating on things, such as reading the newspaper or watching television    Moving or speaking so slowly that other people could have noticed.  Or the opposite - being so fidgety or restless that you have been moving around a lot more than usual    Thoughts that you would be better off dead, or hurting yourself in some way    PHQ2-9 Total Score    If you checked off any problems, how difficult have these problems made it for you to do your work, take care of things at home, or get along with other people    Depression Interventions/Treatment      There were no vitals filed for this visit.  MEDICATIONS: Patient denies concerns with medications today, no difficulty with getting refills, denies adverse reactions or side effects.    Recommendation:   Mr. Mercier needs:  Referral to: Ophthalmology, Podiatry.  This RNCM will send message to PCP office for recommendations.   Follow Up Plan:   Telephone follow-up two weeks  Santana Stamp BSN, CCM [place]  [facility] Population Health RN  Care Manager Direct Dial: [phone]  Fax: [phone]

## 2024-03-26 NOTE — Patient Instructions (Signed)
 Visit Information  Thank you for taking time to visit with me today. Please don't hesitate to contact me if I can be of assistance to you before our next scheduled appointment.  Your next care management appointment is by telephone on Monday, October 20th at 3:00pm    Please call the care guide team at [phone] if you need to cancel, schedule, or reschedule an appointment.   A reminder to ALL patients/family/friends, please call the [place]  National Suicide Prevention Lifeline: 3432701483 or TTY: 205-322-2028 TTY 2364832285) to talk to a trained counselor if you are experiencing a Mental Health or Behavioral Health Crisis or need someone to talk to.  Santana Stamp BSN, CCM [place]  [facility] Population Health RN Care Manager Direct Dial: [phone]  Fax: [phone]

## 2024-03-26 NOTE — Patient Outreach (Signed)
 Attempted to contact referral dept at [facility], left voicemail with Heidi ([employer]?) inquiring of recommendations for Podiatry and Ophthalmology for patient.

## 2024-03-27 DIAGNOSIS — J449 Chronic obstructive pulmonary disease, unspecified: Secondary | ICD-10-CM | POA: Diagnosis not present

## 2024-03-27 DIAGNOSIS — J9601 Acute respiratory failure with hypoxia: Secondary | ICD-10-CM | POA: Diagnosis not present

## 2024-03-27 DIAGNOSIS — I129 Hypertensive chronic kidney disease with stage 1 through stage 4 chronic kidney disease, or unspecified chronic kidney disease: Secondary | ICD-10-CM | POA: Diagnosis not present

## 2024-03-27 DIAGNOSIS — J430 Unilateral pulmonary emphysema [MacLeod's syndrome]: Secondary | ICD-10-CM | POA: Diagnosis not present

## 2024-03-28 DIAGNOSIS — F17210 Nicotine dependence, cigarettes, uncomplicated: Secondary | ICD-10-CM | POA: Diagnosis not present

## 2024-03-28 DIAGNOSIS — Z7984 Long term (current) use of oral hypoglycemic drugs: Secondary | ICD-10-CM | POA: Diagnosis not present

## 2024-03-28 DIAGNOSIS — Z7982 Long term (current) use of aspirin: Secondary | ICD-10-CM | POA: Diagnosis not present

## 2024-03-28 DIAGNOSIS — I080 Rheumatic disorders of both mitral and aortic valves: Secondary | ICD-10-CM | POA: Diagnosis not present

## 2024-03-28 DIAGNOSIS — I110 Hypertensive heart disease with heart failure: Secondary | ICD-10-CM | POA: Diagnosis not present

## 2024-03-28 DIAGNOSIS — F331 Major depressive disorder, recurrent, moderate: Secondary | ICD-10-CM | POA: Diagnosis not present

## 2024-03-28 DIAGNOSIS — J441 Chronic obstructive pulmonary disease with (acute) exacerbation: Secondary | ICD-10-CM | POA: Diagnosis not present

## 2024-03-28 DIAGNOSIS — K922 Gastrointestinal hemorrhage, unspecified: Secondary | ICD-10-CM | POA: Diagnosis not present

## 2024-03-28 DIAGNOSIS — D62 Acute posthemorrhagic anemia: Secondary | ICD-10-CM | POA: Diagnosis not present

## 2024-03-28 DIAGNOSIS — Z9981 Dependence on supplemental oxygen: Secondary | ICD-10-CM | POA: Diagnosis not present

## 2024-03-28 DIAGNOSIS — J9611 Chronic respiratory failure with hypoxia: Secondary | ICD-10-CM | POA: Diagnosis not present

## 2024-03-28 DIAGNOSIS — D0220 Carcinoma in situ of unspecified bronchus and lung: Secondary | ICD-10-CM | POA: Diagnosis not present

## 2024-03-28 DIAGNOSIS — N1831 Chronic kidney disease, stage 3a: Secondary | ICD-10-CM | POA: Diagnosis not present

## 2024-03-28 DIAGNOSIS — Z9181 History of falling: Secondary | ICD-10-CM | POA: Diagnosis not present

## 2024-03-28 DIAGNOSIS — E1122 Type 2 diabetes mellitus with diabetic chronic kidney disease: Secondary | ICD-10-CM | POA: Diagnosis not present

## 2024-03-28 DIAGNOSIS — N1832 Chronic kidney disease, stage 3b: Secondary | ICD-10-CM | POA: Diagnosis not present

## 2024-03-28 DIAGNOSIS — I5033 Acute on chronic diastolic (congestive) heart failure: Secondary | ICD-10-CM | POA: Diagnosis not present

## 2024-04-03 DIAGNOSIS — M1711 Unilateral primary osteoarthritis, right knee: Secondary | ICD-10-CM | POA: Diagnosis not present

## 2024-04-03 DIAGNOSIS — M25461 Effusion, right knee: Secondary | ICD-10-CM | POA: Diagnosis not present

## 2024-04-08 ENCOUNTER — Telehealth: Payer: Self-pay

## 2024-04-08 DIAGNOSIS — D0220 Carcinoma in situ of unspecified bronchus and lung: Secondary | ICD-10-CM | POA: Diagnosis not present

## 2024-04-08 DIAGNOSIS — I080 Rheumatic disorders of both mitral and aortic valves: Secondary | ICD-10-CM | POA: Diagnosis not present

## 2024-04-08 DIAGNOSIS — I5033 Acute on chronic diastolic (congestive) heart failure: Secondary | ICD-10-CM | POA: Diagnosis not present

## 2024-04-08 DIAGNOSIS — Z7984 Long term (current) use of oral hypoglycemic drugs: Secondary | ICD-10-CM | POA: Diagnosis not present

## 2024-04-08 DIAGNOSIS — K922 Gastrointestinal hemorrhage, unspecified: Secondary | ICD-10-CM | POA: Diagnosis not present

## 2024-04-08 DIAGNOSIS — J441 Chronic obstructive pulmonary disease with (acute) exacerbation: Secondary | ICD-10-CM | POA: Diagnosis not present

## 2024-04-08 DIAGNOSIS — Z9181 History of falling: Secondary | ICD-10-CM | POA: Diagnosis not present

## 2024-04-08 DIAGNOSIS — Z9981 Dependence on supplemental oxygen: Secondary | ICD-10-CM | POA: Diagnosis not present

## 2024-04-08 DIAGNOSIS — I110 Hypertensive heart disease with heart failure: Secondary | ICD-10-CM | POA: Diagnosis not present

## 2024-04-08 DIAGNOSIS — F17210 Nicotine dependence, cigarettes, uncomplicated: Secondary | ICD-10-CM | POA: Diagnosis not present

## 2024-04-08 DIAGNOSIS — D62 Acute posthemorrhagic anemia: Secondary | ICD-10-CM | POA: Diagnosis not present

## 2024-04-08 DIAGNOSIS — E1122 Type 2 diabetes mellitus with diabetic chronic kidney disease: Secondary | ICD-10-CM | POA: Diagnosis not present

## 2024-04-08 DIAGNOSIS — J9611 Chronic respiratory failure with hypoxia: Secondary | ICD-10-CM | POA: Diagnosis not present

## 2024-04-08 DIAGNOSIS — F331 Major depressive disorder, recurrent, moderate: Secondary | ICD-10-CM | POA: Diagnosis not present

## 2024-04-08 DIAGNOSIS — N1831 Chronic kidney disease, stage 3a: Secondary | ICD-10-CM | POA: Diagnosis not present

## 2024-04-08 DIAGNOSIS — Z7982 Long term (current) use of aspirin: Secondary | ICD-10-CM | POA: Diagnosis not present

## 2024-04-08 DIAGNOSIS — N1832 Chronic kidney disease, stage 3b: Secondary | ICD-10-CM | POA: Diagnosis not present

## 2024-04-11 DIAGNOSIS — K922 Gastrointestinal hemorrhage, unspecified: Secondary | ICD-10-CM | POA: Diagnosis not present

## 2024-04-11 DIAGNOSIS — J441 Chronic obstructive pulmonary disease with (acute) exacerbation: Secondary | ICD-10-CM | POA: Diagnosis not present

## 2024-04-11 DIAGNOSIS — I080 Rheumatic disorders of both mitral and aortic valves: Secondary | ICD-10-CM | POA: Diagnosis not present

## 2024-04-11 DIAGNOSIS — J9611 Chronic respiratory failure with hypoxia: Secondary | ICD-10-CM | POA: Diagnosis not present

## 2024-04-11 DIAGNOSIS — Z9981 Dependence on supplemental oxygen: Secondary | ICD-10-CM | POA: Diagnosis not present

## 2024-04-11 DIAGNOSIS — D62 Acute posthemorrhagic anemia: Secondary | ICD-10-CM | POA: Diagnosis not present

## 2024-04-11 DIAGNOSIS — D0220 Carcinoma in situ of unspecified bronchus and lung: Secondary | ICD-10-CM | POA: Diagnosis not present

## 2024-04-11 DIAGNOSIS — Z7984 Long term (current) use of oral hypoglycemic drugs: Secondary | ICD-10-CM | POA: Diagnosis not present

## 2024-04-11 DIAGNOSIS — I5033 Acute on chronic diastolic (congestive) heart failure: Secondary | ICD-10-CM | POA: Diagnosis not present

## 2024-04-11 DIAGNOSIS — F17210 Nicotine dependence, cigarettes, uncomplicated: Secondary | ICD-10-CM | POA: Diagnosis not present

## 2024-04-11 DIAGNOSIS — I110 Hypertensive heart disease with heart failure: Secondary | ICD-10-CM | POA: Diagnosis not present

## 2024-04-11 DIAGNOSIS — Z7982 Long term (current) use of aspirin: Secondary | ICD-10-CM | POA: Diagnosis not present

## 2024-04-11 DIAGNOSIS — E1122 Type 2 diabetes mellitus with diabetic chronic kidney disease: Secondary | ICD-10-CM | POA: Diagnosis not present

## 2024-04-11 DIAGNOSIS — N1832 Chronic kidney disease, stage 3b: Secondary | ICD-10-CM | POA: Diagnosis not present

## 2024-04-11 DIAGNOSIS — Z9181 History of falling: Secondary | ICD-10-CM | POA: Diagnosis not present

## 2024-04-11 DIAGNOSIS — N1831 Chronic kidney disease, stage 3a: Secondary | ICD-10-CM | POA: Diagnosis not present

## 2024-04-11 DIAGNOSIS — F331 Major depressive disorder, recurrent, moderate: Secondary | ICD-10-CM | POA: Diagnosis not present

## 2024-04-18 DIAGNOSIS — I110 Hypertensive heart disease with heart failure: Secondary | ICD-10-CM | POA: Diagnosis not present

## 2024-04-18 DIAGNOSIS — Z9181 History of falling: Secondary | ICD-10-CM | POA: Diagnosis not present

## 2024-04-18 DIAGNOSIS — Z9981 Dependence on supplemental oxygen: Secondary | ICD-10-CM | POA: Diagnosis not present

## 2024-04-18 DIAGNOSIS — E1122 Type 2 diabetes mellitus with diabetic chronic kidney disease: Secondary | ICD-10-CM | POA: Diagnosis not present

## 2024-04-18 DIAGNOSIS — D62 Acute posthemorrhagic anemia: Secondary | ICD-10-CM | POA: Diagnosis not present

## 2024-04-18 DIAGNOSIS — D0220 Carcinoma in situ of unspecified bronchus and lung: Secondary | ICD-10-CM | POA: Diagnosis not present

## 2024-04-18 DIAGNOSIS — F331 Major depressive disorder, recurrent, moderate: Secondary | ICD-10-CM | POA: Diagnosis not present

## 2024-04-18 DIAGNOSIS — Z7982 Long term (current) use of aspirin: Secondary | ICD-10-CM | POA: Diagnosis not present

## 2024-04-18 DIAGNOSIS — J441 Chronic obstructive pulmonary disease with (acute) exacerbation: Secondary | ICD-10-CM | POA: Diagnosis not present

## 2024-04-18 DIAGNOSIS — N1832 Chronic kidney disease, stage 3b: Secondary | ICD-10-CM | POA: Diagnosis not present

## 2024-04-18 DIAGNOSIS — N1831 Chronic kidney disease, stage 3a: Secondary | ICD-10-CM | POA: Diagnosis not present

## 2024-04-18 DIAGNOSIS — F17210 Nicotine dependence, cigarettes, uncomplicated: Secondary | ICD-10-CM | POA: Diagnosis not present

## 2024-04-18 DIAGNOSIS — J9611 Chronic respiratory failure with hypoxia: Secondary | ICD-10-CM | POA: Diagnosis not present

## 2024-04-18 DIAGNOSIS — I080 Rheumatic disorders of both mitral and aortic valves: Secondary | ICD-10-CM | POA: Diagnosis not present

## 2024-04-18 DIAGNOSIS — I5033 Acute on chronic diastolic (congestive) heart failure: Secondary | ICD-10-CM | POA: Diagnosis not present

## 2024-04-18 DIAGNOSIS — Z7984 Long term (current) use of oral hypoglycemic drugs: Secondary | ICD-10-CM | POA: Diagnosis not present

## 2024-04-18 DIAGNOSIS — K922 Gastrointestinal hemorrhage, unspecified: Secondary | ICD-10-CM | POA: Diagnosis not present

## 2024-04-24 DIAGNOSIS — I080 Rheumatic disorders of both mitral and aortic valves: Secondary | ICD-10-CM | POA: Diagnosis not present

## 2024-04-24 DIAGNOSIS — D0220 Carcinoma in situ of unspecified bronchus and lung: Secondary | ICD-10-CM | POA: Diagnosis not present

## 2024-04-24 DIAGNOSIS — F331 Major depressive disorder, recurrent, moderate: Secondary | ICD-10-CM | POA: Diagnosis not present

## 2024-04-24 DIAGNOSIS — Z7984 Long term (current) use of oral hypoglycemic drugs: Secondary | ICD-10-CM | POA: Diagnosis not present

## 2024-04-24 DIAGNOSIS — E1122 Type 2 diabetes mellitus with diabetic chronic kidney disease: Secondary | ICD-10-CM | POA: Diagnosis not present

## 2024-04-24 DIAGNOSIS — I110 Hypertensive heart disease with heart failure: Secondary | ICD-10-CM | POA: Diagnosis not present

## 2024-04-24 DIAGNOSIS — D62 Acute posthemorrhagic anemia: Secondary | ICD-10-CM | POA: Diagnosis not present

## 2024-04-24 DIAGNOSIS — F17210 Nicotine dependence, cigarettes, uncomplicated: Secondary | ICD-10-CM | POA: Diagnosis not present

## 2024-04-24 DIAGNOSIS — Z7982 Long term (current) use of aspirin: Secondary | ICD-10-CM | POA: Diagnosis not present

## 2024-04-24 DIAGNOSIS — N1832 Chronic kidney disease, stage 3b: Secondary | ICD-10-CM | POA: Diagnosis not present

## 2024-04-24 DIAGNOSIS — Z9181 History of falling: Secondary | ICD-10-CM | POA: Diagnosis not present

## 2024-04-24 DIAGNOSIS — J441 Chronic obstructive pulmonary disease with (acute) exacerbation: Secondary | ICD-10-CM | POA: Diagnosis not present

## 2024-04-24 DIAGNOSIS — I5033 Acute on chronic diastolic (congestive) heart failure: Secondary | ICD-10-CM | POA: Diagnosis not present

## 2024-04-24 DIAGNOSIS — N1831 Chronic kidney disease, stage 3a: Secondary | ICD-10-CM | POA: Diagnosis not present

## 2024-04-24 DIAGNOSIS — Z9981 Dependence on supplemental oxygen: Secondary | ICD-10-CM | POA: Diagnosis not present

## 2024-04-24 DIAGNOSIS — K922 Gastrointestinal hemorrhage, unspecified: Secondary | ICD-10-CM | POA: Diagnosis not present

## 2024-04-24 DIAGNOSIS — J9611 Chronic respiratory failure with hypoxia: Secondary | ICD-10-CM | POA: Diagnosis not present

## 2024-04-27 DIAGNOSIS — J449 Chronic obstructive pulmonary disease, unspecified: Secondary | ICD-10-CM | POA: Diagnosis not present

## 2024-04-27 DIAGNOSIS — J430 Unilateral pulmonary emphysema [MacLeod's syndrome]: Secondary | ICD-10-CM | POA: Diagnosis not present

## 2024-04-27 DIAGNOSIS — J9601 Acute respiratory failure with hypoxia: Secondary | ICD-10-CM | POA: Diagnosis not present

## 2024-04-27 DIAGNOSIS — I129 Hypertensive chronic kidney disease with stage 1 through stage 4 chronic kidney disease, or unspecified chronic kidney disease: Secondary | ICD-10-CM | POA: Diagnosis not present

## 2024-04-30 ENCOUNTER — Other Ambulatory Visit: Payer: Self-pay

## 2024-04-30 ENCOUNTER — Telehealth: Payer: Self-pay

## 2024-04-30 NOTE — Patient Outreach (Signed)
 Left voicemail with [facility] on x [phone] to assist patient with referral to an in-network Ophthalmologist.  Patient states he was seeing one in [facility] [place] but now that he is in [employer] will not cover cost. Provided my contact info in the VM so I may assist patient in making appt.

## 2024-04-30 NOTE — Patient Instructions (Signed)
 Visit Information  Thank you for taking time to visit with me today. Please don't hesitate to contact me if I can be of assistance to you before our next scheduled appointment.  Your next care management appointment is by telephone on Wednesday, November 19th at 4:00pm   Please call the care guide team at [phone] if you need to cancel, schedule, or reschedule an appointment.   A reminder to ALL patients/family/friends, please call the [place]  National Suicide Prevention Lifeline: 978-780-6069 or TTY: 671-371-4714 TTY 6294110825) to talk to a trained counselor if you are experiencing a Mental Health or Behavioral Health Crisis or need someone to talk to.  Santana Stamp BSN, CCM [place]  [facility] Population Health RN Care Manager Direct Dial: [phone]  Fax: [phone]

## 2024-04-30 NOTE — Patient Outreach (Signed)
 Complex Care Management   Visit Note  04/30/2024  Name:  Tristan Brooks MRN: [id number] DOB: 07/08/1940  Situation: Referral received for Complex Care Management related to COPD, Chronic Kidney Disease, and DM. I obtained verbal consent from Patient.  Visit completed with Tristan Brooks  on the phone. Today's call focused on back pain interventions (rates lower back pain of 5/10 today, on-going, saw Ortho 04/03/24), and obtaining in-network Podiatry and Ophthalmology appts.   Background:   Past Medical History:  Diagnosis Date   Aortic stenosis, severe    CHF (congestive heart failure) (HCC)    CKD (chronic kidney disease)    COPD (chronic obstructive pulmonary disease) (HCC)    Diabetes mellitus without complication (HCC)    HLD (hyperlipidemia)    Mass of right lung    Neuropathy     Assessment: Patient Reported Symptoms:  Cognitive Cognitive Status: Alert and oriented to person, place, and time, Normal speech and language skills      Neurological Neurological Review of Symptoms: No symptoms reported    HEENT HEENT Symptoms Reported: Other: HEENT Comment: film over right eye, this RNCM left 2nd VM with PCP office for Ophthalmology referral.    Cardiovascular Cardiovascular Symptoms Reported: Swelling in legs or feet Cardiovascular Comment: Swelling in bilateral ankles at baseline  Respiratory Respiratory Symptoms Reported: No symptoms reported    Endocrine Endocrine Symptoms Reported: No symptoms reported Is patient diabetic?: Yes Is patient checking blood sugars at home?: Yes List most recent blood sugar readings, include date and time of day: Today's FBG: 140mg /dL    Gastrointestinal Gastrointestinal Symptoms Reported: Not assessed      Genitourinary Genitourinary Symptoms Reported: Not assessed    Integumentary      Musculoskeletal Musculoskelatal Symptoms Reviewed: No symptoms reported        Psychosocial Psychosocial Symptoms Reported: Not assessed           04/30/2024    PHQ2-9 Depression Screening   Little interest or pleasure in doing things    Feeling down, depressed, or hopeless    PHQ-2 - Total Score    Trouble falling or staying asleep, or sleeping too much    Feeling tired or having little energy    Poor appetite or overeating     Feeling bad about yourself - or that you are a failure or have let yourself or your family down    Trouble concentrating on things, such as reading the newspaper or watching television    Moving or speaking so slowly that other people could have noticed.  Or the opposite - being so fidgety or restless that you have been moving around a lot more than usual    Thoughts that you would be better off dead, or hurting yourself in some way    PHQ2-9 Total Score    If you checked off any problems, how difficult have these problems made it for you to do your work, take care of things at home, or get along with other people    Depression Interventions/Treatment      There were no vitals filed for this visit. Pain Scale: 0-10 Pain Score: 6  Pain Type: Chronic pain Pain Location: Back Pain Orientation: Lower Pain Descriptors / Indicators: Aching Pain Onset: With Activity Pain Intervention(s):  (Reviewed last Ortho appt for safe pain interventions.  Patient is going to try heat/ice, lidocaine  patch, Biofreeze gel.)  Medications Reviewed Today     Reviewed by Lucian Santana LABOR, RN (Registered Nurse) on 04/30/24  at 1202  Med List Status: <None>   Medication Order Taking? Sig Documenting Provider Last Dose Status Informant  albuterol  (VENTOLIN  HFA) 108 (90 Base) MCG/ACT inhaler [id number]  Inhale 2 puffs into the lungs every 4 (four) hours as needed for shortness of breath or wheezing. [provider]  Active Pharmacy Records, Self  ANORO ELLIPTA  62.5-25 MCG/ACT AEPB [id number]  Inhale 1 puff into the lungs daily. [provider]  Active Pharmacy Records, Self  atorvastatin  (LIPITOR) 20 MG  tablet [id number]  Take 20 mg by mouth daily. [provider]  Active Pharmacy Records, Self  Cholecalciferol (VITAMIN D-3) 25 MCG (1000 UT) CAPS [id number]  Take 1 capsule by mouth daily. [provider]  Active Pharmacy Records, Self  ezetimibe  (ZETIA ) 10 MG tablet [id number]  Take 1 tablet by mouth daily. [provider]  Active Pharmacy Records, Self  Ferrous Sulfate Dried (HIGH POTENCY IRON) 65 MG TABS [id number]  Take 1 tablet by mouth daily. [provider]  Active Pharmacy Records, Self  furosemide  (LASIX ) 20 MG tablet [id number] Yes Take 1 tablet (20 mg total) by mouth daily. Alvan Dorn FALCON, MD  Active Pharmacy Records, Self  gabapentin  (NEURONTIN ) 100 MG capsule [id number]  Take 100 mg by mouth 2 (two) times daily. [provider]  Active Pharmacy Records, Self  insulin  aspart (NOVOLOG ) 100 UNIT/ML FlexPen [id number]  Inject 3 Units into the skin 3 (three) times daily with meals.  Patient not taking: Reported on 03/01/2024   Sira, Zackery, MD  Expired 03/07/24 2359   insulin  glargine-yfgn (SEMGLEE ) 100 UNIT/ML injection [id number]  Inject 0.45 mLs (45 Units total) into the skin 2 (two) times daily.  Patient not taking: Reported on 03/01/2024   Sira, Zackery, MD  Active   mirtazapine  (REMERON ) 15 MG tablet [id number]  Take 15 mg by mouth at bedtime. [provider]  Active Pharmacy Records, Self  Misc. Devices (PULSE OXIMETER FOR FINGER) MISC [id number]  Use pulse oximeter as need Alvan Dorn FALCON, MD  Active Pharmacy Records, Self  omeprazole (PRILOSEC) 40 MG capsule [id number]  Take 40 mg by mouth daily. [provider]  Active   [facility] VERIO test strip [id number] Yes 1 each by Other route as needed for other. [provider]  Active Pharmacy Records, Self  pantoprazole  (PROTONIX ) 40 MG tablet [id number]  Take 1 tablet (40 mg total) by mouth daily.  Patient not taking: Reported on 03/01/2024   Sira, Zackery, MD  Expired  03/07/24 2359             Recommendation:   Continue Current Plan of Care Patient will call [facility]/Podiatry to set up new patient appointment This RNCM left VM with PCP office to obtain referral for Ophthalmology.  Patient is going to try lidocaine  patch, Biofreeze gel, heat/ice pack as recommended by Ortho at last 04/03/24 OV to manage lower back pain..    Follow Up Plan:   Telephone follow-up in 1 month  Santana Stamp BSN, CCM [place]  [facility] Population Health RN Care Manager Direct Dial: [phone]  Fax: [phone]

## 2024-05-08 ENCOUNTER — Other Ambulatory Visit: Payer: Self-pay

## 2024-05-08 NOTE — Patient Outreach (Signed)
 Complex Care Management   Visit Note  05/08/2024  Name:  Tristan Brooks MRN: [id number] DOB: 07-16-1940  Situation: Referral received for Complex Care Management related to COPD and DM with stage 3 CKD I obtained verbal consent from Patient.  Visit completed with Tristan Brooks  on the phone. Call today focused on obtaining in-network providers for Ophthalmology and Podiatry, and securing appointments.    Background:   Past Medical History:  Diagnosis Date   Aortic stenosis, severe    CHF (congestive heart failure) (HCC)    CKD (chronic kidney disease)    COPD (chronic obstructive pulmonary disease) (HCC)    Diabetes mellitus without complication (HCC)    HLD (hyperlipidemia)    Mass of right lung    Neuropathy     Assessment: Patient Reported Symptoms:  Cognitive Cognitive Status: Alert and oriented to person, place, and time, Insightful and able to interpret abstract concepts, Normal speech and language skills      Neurological Neurological Review of Symptoms: No symptoms reported    HEENT HEENT Symptoms Reported: Other: HEENT Comment: film over right eye.  This RNCM conference called with patient and [employer] for in-network Ophthalmologist, appt made for 05/27/24.    Cardiovascular Cardiovascular Symptoms Reported: Swelling in legs or feet Cardiovascular Comment: Swelling in bilateral ankles at baseline  Respiratory Respiratory Symptoms Reported: No symptoms reported    Endocrine Endocrine Symptoms Reported: No symptoms reported Is patient diabetic?: Yes    Gastrointestinal Gastrointestinal Symptoms Reported: Not assessed      Genitourinary Genitourinary Symptoms Reported: Not assessed    Integumentary Integumentary Symptoms Reported: No symptoms reported Additional Integumentary Details: No sores on his feet, states he needs his toenails cut.  Confernce call with [employer], appt made for 05/13/24    Musculoskeletal Musculoskelatal Symptoms Reviewed: Not  assessed        Psychosocial Psychosocial Symptoms Reported: Not assessed          05/08/2024    PHQ2-9 Depression Screening   Little interest or pleasure in doing things    Feeling down, depressed, or hopeless    PHQ-2 - Total Score    Trouble falling or staying asleep, or sleeping too much    Feeling tired or having little energy    Poor appetite or overeating     Feeling bad about yourself - or that you are a failure or have let yourself or your family down    Trouble concentrating on things, such as reading the newspaper or watching television    Moving or speaking so slowly that other people could have noticed.  Or the opposite - being so fidgety or restless that you have been moving around a lot more than usual    Thoughts that you would be better off dead, or hurting yourself in some way    PHQ2-9 Total Score    If you checked off any problems, how difficult have these problems made it for you to do your work, take care of things at home, or get along with other people    Depression Interventions/Treatment      There were no vitals filed for this visit.    Medications Reviewed Today   Medications were not reviewed in this encounter     Recommendation:   Specialty provider follow-up Podiatry for DM foot exam 05/13/24 at 1:45pm, New patient Ophthalmology for DM eye exam on 12/8 at 1pm.   Follow Up Plan:   Telephone follow-up two weeks.   Santana  Lissandra Keil BSN, CCM   [facility] Population Health RN Care Manager Direct Dial: [phone]  Fax: [phone]

## 2024-05-08 NOTE — Patient Instructions (Signed)
 Visit Information  Thank you for taking time to visit with me today. Please don't hesitate to contact me if I can be of assistance to you before our next scheduled appointment.  Your next care management appointment is by telephone on Wednesday, December 3rd  at 3:45pm.    Please call the care guide team at [phone] if you need to cancel, schedule, or reschedule an appointment.   Please call the [place]  National Suicide Prevention Lifeline: (231)379-3117 or TTY: 904 665 7975 TTY (331)817-7681) to talk to a trained counselor if you are experiencing a Mental Health or Behavioral Health Crisis or need someone to talk to.  Santana Stamp BSN, CCM [place]  [facility] Population Health RN Care Manager Direct Dial: [phone]  Fax: [phone]

## 2024-05-13 DIAGNOSIS — E1151 Type 2 diabetes mellitus with diabetic peripheral angiopathy without gangrene: Secondary | ICD-10-CM | POA: Diagnosis not present

## 2024-05-13 DIAGNOSIS — M79672 Pain in left foot: Secondary | ICD-10-CM | POA: Diagnosis not present

## 2024-05-13 DIAGNOSIS — M2042 Other hammer toe(s) (acquired), left foot: Secondary | ICD-10-CM | POA: Diagnosis not present

## 2024-05-13 DIAGNOSIS — E1140 Type 2 diabetes mellitus with diabetic neuropathy, unspecified: Secondary | ICD-10-CM | POA: Diagnosis not present

## 2024-05-13 DIAGNOSIS — M2041 Other hammer toe(s) (acquired), right foot: Secondary | ICD-10-CM | POA: Diagnosis not present

## 2024-05-13 DIAGNOSIS — M79671 Pain in right foot: Secondary | ICD-10-CM | POA: Diagnosis not present

## 2024-05-21 ENCOUNTER — Telehealth: Payer: Self-pay

## 2024-05-22 ENCOUNTER — Telehealth

## 2024-05-27 DIAGNOSIS — J449 Chronic obstructive pulmonary disease, unspecified: Secondary | ICD-10-CM | POA: Diagnosis not present

## 2024-05-27 DIAGNOSIS — I129 Hypertensive chronic kidney disease with stage 1 through stage 4 chronic kidney disease, or unspecified chronic kidney disease: Secondary | ICD-10-CM | POA: Diagnosis not present

## 2024-05-27 DIAGNOSIS — J9601 Acute respiratory failure with hypoxia: Secondary | ICD-10-CM | POA: Diagnosis not present

## 2024-05-27 DIAGNOSIS — J430 Unilateral pulmonary emphysema [MacLeod's syndrome]: Secondary | ICD-10-CM | POA: Diagnosis not present

## 2024-05-31 ENCOUNTER — Other Ambulatory Visit: Payer: Self-pay

## 2024-05-31 ENCOUNTER — Telehealth: Payer: Self-pay

## 2024-05-31 NOTE — Patient Outreach (Signed)
 Attempted to contact [facility], left message on nurseline voicemail requesting recommendations for medicine to treat patient's productive cough, as requested by patient.

## 2024-06-02 NOTE — Patient Outreach (Signed)
 Complex Care Management   Visit Note  06/02/2024  Name:  Tristan Brooks MRN: [id number] DOB: [birth year]  Situation: Referral received for Complex Care Management related to {Criteria:32550} I obtained verbal consent from {CHL AMB Patient/Caregiver:28184}.  Visit completed with {CHL AMB Patient/Caregiver:28184}  {VISIT LOCATION:32553}  Background:   Past Medical History:  Diagnosis Date   Aortic stenosis, severe    CHF (congestive heart failure) (HCC)    CKD (chronic kidney disease)    COPD (chronic obstructive pulmonary disease) (HCC)    Diabetes mellitus without complication (HCC)    HLD (hyperlipidemia)    Mass of right lung    Neuropathy     Assessment: Patient Reported Symptoms:  Cognitive Cognitive Status: Alert and oriented to person, place, and time, Insightful and able to interpret abstract concepts, Normal speech and language skills      Neurological Neurological Review of Symptoms: No symptoms reported    HEENT HEENT Symptoms Reported: Other: HEENT Comment: Right eye has a film over it, left eye is clear. Had an eye MD but he's out of network, he had appt on 1/8 with [facility] but he inadvertently missed the appt. Office was closed this afternoon, will try again on Monday 12/15.    Cardiovascular Cardiovascular Symptoms Reported: Swelling in legs or feet Cardiovascular Comment: Edema in bilateral LE at baseline.  Respiratory Respiratory Symptoms Reported: Productive cough Additional Respiratory Details: Patient went to PCP on 05/27/24, finishing up antibiotics and Prednisone .    Endocrine Endocrine Symptoms Reported: No symptoms reported Is patient diabetic?: Yes Is patient checking blood sugars at home?: Yes List most recent blood sugar readings, include date and time of day: Today's FBG: 130mg /dL. Endocrine Comment: Taking Actos 15mg  once daily and Glipizide 10mg  once daily.  A1C 10.6% on 12/26/2023.  Gastrointestinal Gastrointestinal Symptoms Reported: No  symptoms reported      Genitourinary Genitourinary Symptoms Reported: No symptoms reported    Integumentary Integumentary Symptoms Reported: No symptoms reported    Musculoskeletal Musculoskelatal Symptoms Reviewed: Not assessed        Psychosocial Psychosocial Symptoms Reported: Not assessed          06/02/2024    PHQ2-9 Depression Screening   Little interest or pleasure in doing things    Feeling down, depressed, or hopeless    PHQ-2 - Total Score    Trouble falling or staying asleep, or sleeping too much    Feeling tired or having little energy    Poor appetite or overeating     Feeling bad about yourself - or that you are a failure or have let yourself or your family down    Trouble concentrating on things, such as reading the newspaper or watching television    Moving or speaking so slowly that other people could have noticed.  Or the opposite - being so fidgety or restless that you have been moving around a lot more than usual    Thoughts that you would be better off dead, or hurting yourself in some way    PHQ2-9 Total Score    If you checked off any problems, how difficult have these problems made it for you to do your work, take care of things at home, or get along with other people    Depression Interventions/Treatment      There were no vitals filed for this visit.    Medications Reviewed Today     Reviewed by Lucian Santana LABOR, RN (Registered Nurse) on 05/31/24 at 1525  Med List Status: <  None>   Medication Order Taking? Sig Documenting Provider Last Dose Status Informant  albuterol  (VENTOLIN  HFA) 108 (90 Base) MCG/ACT inhaler [id number]  Inhale 2 puffs into the lungs every 4 (four) hours as needed for shortness of breath or wheezing. [provider]  Active Pharmacy Records, Self  ANORO ELLIPTA  62.5-25 MCG/ACT AEPB [id number]  Inhale 1 puff into the lungs daily. [provider]  Active Pharmacy Records, Self  atorvastatin  (LIPITOR) 20 MG tablet  [id number]  Take 20 mg by mouth daily. [provider]  Active Pharmacy Records, Self  Cholecalciferol (VITAMIN D-3) 25 MCG (1000 UT) CAPS [id number]  Take 1 capsule by mouth daily. [provider]  Active Pharmacy Records, Self  ezetimibe  (ZETIA ) 10 MG tablet [id number]  Take 1 tablet by mouth daily. [provider]  Active Pharmacy Records, Self  Ferrous Sulfate Dried (HIGH POTENCY IRON) 65 MG TABS [id number]  Take 1 tablet by mouth daily. [provider]  Active Pharmacy Records, Self  furosemide  (LASIX ) 20 MG tablet [id number]  Take 1 tablet (20 mg total) by mouth daily. Alvan Dorn FALCON, MD  Active Pharmacy Records, Self  gabapentin  (NEURONTIN ) 100 MG capsule [id number]  Take 100 mg by mouth 2 (two) times daily. [provider]  Active Pharmacy Records, Self  insulin  aspart (NOVOLOG ) 100 UNIT/ML FlexPen [id number]  Inject 3 Units into the skin 3 (three) times daily with meals.  Patient not taking: Reported on 03/01/2024   Sira, Zackery, MD  Expired 03/07/24 2359   insulin  glargine-yfgn (SEMGLEE ) 100 UNIT/ML injection [id number]  Inject 0.45 mLs (45 Units total) into the skin 2 (two) times daily.  Patient not taking: Reported on 03/01/2024   Sira, Zackery, MD  Active   mirtazapine  (REMERON ) 15 MG tablet [id number]  Take 15 mg by mouth at bedtime. [provider]  Active Pharmacy Records, Self  Misc. Devices (PULSE OXIMETER FOR FINGER) MISC [id number]  Use pulse oximeter as need Alvan Dorn FALCON, MD  Active Pharmacy Records, Self  omeprazole (PRILOSEC) 40 MG capsule [id number]  Take 40 mg by mouth daily. [provider]  Active   [facility] VERIO test strip [id number]  1 each by Other route as needed for other. [provider]  Active Pharmacy Records, Self  pantoprazole  (PROTONIX ) 40 MG tablet [id number]  Take 1 tablet (40 mg total) by mouth daily.  Patient not taking: Reported on 03/01/2024   Sira, Zackery, MD  Expired 03/07/24 2359              Recommendation:   Specialty provider follow-up : patient will need to call [facility] to res  Follow Up Plan:   {FOLLOWUP:32559}  SIG ***

## 2024-06-02 NOTE — Patient Outreach (Signed)
 Complex Care Management   Visit Note  06/02/2024  Name:  Tristan Brooks MRN: [id number] DOB: 03-13-1941  Situation: Referral received for Complex Care Management related to COPD, Diabetes with Complications, and Chronic Kidney Disease I obtained verbal consent from Patient.  Visit completed with Mr. Knauer  on the phone. Mr. Odor is working on completing Health Maintenance exams, was able to attend Podiatry appt, inadvertently missed his new patient eye exam appt on 05/27/24.    Background:   Past Medical History:  Diagnosis Date   Aortic stenosis, severe    CHF (congestive heart failure) (HCC)    CKD (chronic kidney disease)    COPD (chronic obstructive pulmonary disease) (HCC)    Diabetes mellitus without complication (HCC)    HLD (hyperlipidemia)    Mass of right lung    Neuropathy     Assessment: Patient Reported Symptoms:  Cognitive Cognitive Status: Alert and oriented to person, place, and time, Insightful and able to interpret abstract concepts, Normal speech and language skills      Neurological Neurological Review of Symptoms: No symptoms reported    HEENT HEENT Symptoms Reported: Other: HEENT Comment: Right eye has a film over it, left eye is clear. Had an eye MD but he's out of network, he had appt on 1/8 with [facility] but he inadvertently missed the appt. Office was closed this afternoon, will try again on Monday 12/15.    Cardiovascular Cardiovascular Symptoms Reported: Swelling in legs or feet Cardiovascular Comment: Edema in bilateral LE at baseline.  Respiratory Respiratory Symptoms Reported: Productive cough Additional Respiratory Details: Patient went to PCP on 05/27/24, finishing up antibiotics and Prednisone .    Endocrine Endocrine Symptoms Reported: No symptoms reported Is patient diabetic?: Yes Is patient checking blood sugars at home?: Yes List most recent blood sugar readings, include date and time of day: Today's FBG: 130mg /dL. Endocrine  Comment: Taking Actos 15mg  once daily and Glipizide 10mg  once daily.  A1C 10.6% on 12/26/2023.  Gastrointestinal Gastrointestinal Symptoms Reported: No symptoms reported      Genitourinary Genitourinary Symptoms Reported: No symptoms reported    Integumentary Integumentary Symptoms Reported: No symptoms reported    Musculoskeletal Musculoskelatal Symptoms Reviewed: Not assessed        Psychosocial Psychosocial Symptoms Reported: Not assessed          06/02/2024    PHQ2-9 Depression Screening   Little interest or pleasure in doing things    Feeling down, depressed, or hopeless    PHQ-2 - Total Score    Trouble falling or staying asleep, or sleeping too much    Feeling tired or having little energy    Poor appetite or overeating     Feeling bad about yourself - or that you are a failure or have let yourself or your family down    Trouble concentrating on things, such as reading the newspaper or watching television    Moving or speaking so slowly that other people could have noticed.  Or the opposite - being so fidgety or restless that you have been moving around a lot more than usual    Thoughts that you would be better off dead, or hurting yourself in some way    PHQ2-9 Total Score    If you checked off any problems, how difficult have these problems made it for you to do your work, take care of things at home, or get along with other people    Depression Interventions/Treatment      There were  no vitals filed for this visit.    Medications Reviewed Today     Reviewed by Lucian Santana LABOR, RN (Registered Nurse) on 05/31/24 at 1525  Med List Status: <None>   Medication Order Taking? Sig Documenting Provider Last Dose Status Informant  albuterol  (VENTOLIN  HFA) 108 (90 Base) MCG/ACT inhaler [id number]  Inhale 2 puffs into the lungs every 4 (four) hours as needed for shortness of breath or wheezing. [provider]  Active Pharmacy Records, Self  ANORO ELLIPTA  62.5-25  MCG/ACT AEPB [id number]  Inhale 1 puff into the lungs daily. [provider]  Active Pharmacy Records, Self  atorvastatin  (LIPITOR) 20 MG tablet [id number]  Take 20 mg by mouth daily. [provider]  Active Pharmacy Records, Self  Cholecalciferol (VITAMIN D-3) 25 MCG (1000 UT) CAPS [id number]  Take 1 capsule by mouth daily. [provider]  Active Pharmacy Records, Self  ezetimibe  (ZETIA ) 10 MG tablet [id number]  Take 1 tablet by mouth daily. [provider]  Active Pharmacy Records, Self  Ferrous Sulfate Dried (HIGH POTENCY IRON) 65 MG TABS [id number]  Take 1 tablet by mouth daily. [provider]  Active Pharmacy Records, Self  furosemide  (LASIX ) 20 MG tablet [id number]  Take 1 tablet (20 mg total) by mouth daily. Alvan Dorn FALCON, MD  Active Pharmacy Records, Self  gabapentin  (NEURONTIN ) 100 MG capsule [id number]  Take 100 mg by mouth 2 (two) times daily. [provider]  Active Pharmacy Records, Self  insulin  aspart (NOVOLOG ) 100 UNIT/ML FlexPen [id number]  Inject 3 Units into the skin 3 (three) times daily with meals.  Patient not taking: Reported on 03/01/2024   Sira, Zackery, MD  Expired 03/07/24 2359   insulin  glargine-yfgn (SEMGLEE ) 100 UNIT/ML injection [id number]  Inject 0.45 mLs (45 Units total) into the skin 2 (two) times daily.  Patient not taking: Reported on 03/01/2024   Sira, Zackery, MD  Active   mirtazapine  (REMERON ) 15 MG tablet [id number]  Take 15 mg by mouth at bedtime. [provider]  Active Pharmacy Records, Self  Misc. Devices (PULSE OXIMETER FOR FINGER) MISC [id number]  Use pulse oximeter as need Alvan Dorn FALCON, MD  Active Pharmacy Records, Self  omeprazole (PRILOSEC) 40 MG capsule [id number]  Take 40 mg by mouth daily. [provider]  Active   [facility] VERIO test strip [id number]  1 each by Other route as needed for other. [provider]  Active Pharmacy Records, Self  pantoprazole  (PROTONIX ) 40 MG  tablet [id number]  Take 1 tablet (40 mg total) by mouth daily.  Patient not taking: Reported on 03/01/2024   Sira, Zackery, MD  Expired 03/07/24 2359             Recommendation:   Specialty provider follow-up : Will assist patient in rescheduling eye exam on next contact scheduled 06/03/24.  Patient will continue to take blood sugars twice daily and log, will report fasting blood sugars trending    Follow Up Plan:   Telephone follow-up in 1 day  Santana Lucian BSN, CCM [place]  [facility] Population Health RN Care Manager Direct Dial: [phone]  Fax: [phone]

## 2024-06-03 ENCOUNTER — Telehealth: Payer: Self-pay

## 2024-06-03 NOTE — Patient Instructions (Signed)
 Visit Information  Thank you for taking time to visit with me today. Please don't hesitate to contact me if I can be of assistance to you before our next scheduled appointment.  Your next care management appointment is by telephone on Monday, December 15 at 9:30am  Please call the care guide team at [phone] if you need to cancel, schedule, or reschedule an appointment.   Please call the [place]  National Suicide Prevention Lifeline: (253)590-2676 or TTY: (534)147-9050 TTY (351)199-5778) to talk to a trained counselor if you are experiencing a Mental Health or Behavioral Health Crisis or need someone to talk to.  Santana Stamp BSN, CCM [place]  [facility] Population Health RN Care Manager Direct Dial: [phone]  Fax: [phone]

## 2024-06-03 NOTE — Patient Outreach (Signed)
 Complex Care Management   Visit Note  06/03/2024  Name:  Tristan Brooks MRN: [id number] DOB: Dec 12, 1940  Situation: Referral received for Complex Care Management related to COPD, Diabetes with Complications, and Chronic Kidney Disease I obtained verbal consent from Patient.  Visit completed with Tristan Brooks  on the phone. Patient was able to attend Podiatry appt but inadvertently missed eye appt.   Background:   Past Medical History:  Diagnosis Date   Aortic stenosis, severe    CHF (congestive heart failure) (HCC)    CKD (chronic kidney disease)    COPD (chronic obstructive pulmonary disease) (HCC)    Diabetes mellitus without complication (HCC)    HLD (hyperlipidemia)    Mass of right lung    Neuropathy     Assessment: Patient Reported Symptoms:  Cognitive Cognitive Status: Alert and oriented to person, place, and time, Insightful and able to interpret abstract concepts, Normal speech and language skills      Neurological Neurological Review of Symptoms: No symptoms reported    HEENT HEENT Symptoms Reported: Other: HEENT Comment: Right eye has a film over it, left eye is clear. Had an eye MD but he's out of network, he had appt on 1/8 with [facility] but he inadvertently missed the appt. Office was closed this afternoon, will try again on Monday 12/15.    Cardiovascular Cardiovascular Symptoms Reported: Swelling in legs or feet Cardiovascular Comment: Edema in bilateral LE at baseline.  Respiratory Respiratory Symptoms Reported: Productive cough Additional Respiratory Details: Patient went to PCP on 05/27/24, finishing up antibiotics and Prednisone .    Endocrine Endocrine Symptoms Reported: No symptoms reported Is patient diabetic?: Yes Is patient checking blood sugars at home?: Yes List most recent blood sugar readings, include date and time of day: Today's FBG: 130mg /dL. Endocrine Comment: Taking Actos 15mg  once daily and Glipizide 10mg  once daily.  A1C 10.6% on 12/26/2023.   Gastrointestinal Gastrointestinal Symptoms Reported: No symptoms reported      Genitourinary Genitourinary Symptoms Reported: No symptoms reported    Integumentary Integumentary Symptoms Reported: No symptoms reported    Musculoskeletal Musculoskelatal Symptoms Reviewed: Not assessed        Psychosocial Psychosocial Symptoms Reported: Not assessed          06/03/2024    PHQ2-9 Depression Screening   Little interest or pleasure in doing things    Feeling down, depressed, or hopeless    PHQ-2 - Total Score    Trouble falling or staying asleep, or sleeping too much    Feeling tired or having little energy    Poor appetite or overeating     Feeling bad about yourself - or that you are a failure or have let yourself or your family down    Trouble concentrating on things, such as reading the newspaper or watching television    Moving or speaking so slowly that other people could have noticed.  Or the opposite - being so fidgety or restless that you have been moving around a lot more than usual    Thoughts that you would be better off dead, or hurting yourself in some way    PHQ2-9 Total Score    If you checked off any problems, how difficult have these problems made it for you to do your work, take care of things at home, or get along with other people    Depression Interventions/Treatment      There were no vitals filed for this visit.    Medications Reviewed Today  Reviewed by Lucian Santana LABOR, RN (Registered Nurse) on 05/31/24 at 1525  Med List Status: <None>   Medication Order Taking? Sig Documenting Provider Last Dose Status Informant  albuterol  (VENTOLIN  HFA) 108 (90 Base) MCG/ACT inhaler [id number]  Inhale 2 puffs into the lungs every 4 (four) hours as needed for shortness of breath or wheezing. [provider]  Active Pharmacy Records, Self  ANORO ELLIPTA  62.5-25 MCG/ACT AEPB [id number]  Inhale 1 puff into the lungs daily. [provider]  Active  Pharmacy Records, Self  atorvastatin  (LIPITOR) 20 MG tablet [id number]  Take 20 mg by mouth daily. [provider]  Active Pharmacy Records, Self  Cholecalciferol (VITAMIN D-3) 25 MCG (1000 UT) CAPS [id number]  Take 1 capsule by mouth daily. [provider]  Active Pharmacy Records, Self  ezetimibe  (ZETIA ) 10 MG tablet [id number]  Take 1 tablet by mouth daily. [provider]  Active Pharmacy Records, Self  Ferrous Sulfate Dried (HIGH POTENCY IRON) 65 MG TABS [id number]  Take 1 tablet by mouth daily. [provider]  Active Pharmacy Records, Self  furosemide  (LASIX ) 20 MG tablet [id number]  Take 1 tablet (20 mg total) by mouth daily. Alvan Dorn FALCON, MD  Active Pharmacy Records, Self  gabapentin  (NEURONTIN ) 100 MG capsule [id number]  Take 100 mg by mouth 2 (two) times daily. [provider]  Active Pharmacy Records, Self  insulin  aspart (NOVOLOG ) 100 UNIT/ML FlexPen [id number]  Inject 3 Units into the skin 3 (three) times daily with meals.  Patient not taking: Reported on 03/01/2024   Sira, Zackery, MD  Expired 03/07/24 2359   insulin  glargine-yfgn (SEMGLEE ) 100 UNIT/ML injection [id number]  Inject 0.45 mLs (45 Units total) into the skin 2 (two) times daily.  Patient not taking: Reported on 03/01/2024   Sira, Zackery, MD  Active   mirtazapine  (REMERON ) 15 MG tablet [id number]  Take 15 mg by mouth at bedtime. [provider]  Active Pharmacy Records, Self  Misc. Devices (PULSE OXIMETER FOR FINGER) MISC [id number]  Use pulse oximeter as need Alvan Dorn FALCON, MD  Active Pharmacy Records, Self  omeprazole (PRILOSEC) 40 MG capsule [id number]  Take 40 mg by mouth daily. [provider]  Active   [facility] VERIO test strip [id number]  1 each by Other route as needed for other. [provider]  Active Pharmacy Records, Self  pantoprazole  (PROTONIX ) 40 MG tablet [id number]  Take 1 tablet (40 mg total) by mouth daily.  Patient not taking: Reported  on 03/01/2024   Sira, Zackery, MD  Expired 03/07/24 2359             Recommendation:   This RNCM will assist patient in rescheduling eye appointment next week when [facility] reopens.  Follow Up Plan:   Telephone follow-up 2-3 days   Santana Lucian BSN, CCM [place]  [facility] Population Health RN Care Manager Direct Dial: [phone]  Fax: [phone]

## 2024-06-04 ENCOUNTER — Other Ambulatory Visit

## 2024-06-04 NOTE — Patient Outreach (Signed)
 Conference call with patient and [facility], rescheduled appt for 06/17/24 12:00pm.

## 2024-06-05 ENCOUNTER — Telehealth

## 2024-06-21 ENCOUNTER — Other Ambulatory Visit: Payer: Self-pay

## 2024-06-21 NOTE — Patient Instructions (Signed)
 Visit Information  Thank you for taking time to visit with me today. Please don't hesitate to contact me if I can be of assistance to you before our next scheduled appointment.  Your next care management appointment is by telephone on 06/24/2024 at 1230   Please call the care guide team at [phone] if you need to cancel, schedule, or reschedule an appointment.   Please call the [place]  National Suicide Prevention Lifeline: (705)032-0316 or TTY: 269-631-8391 TTY (904) 426-3050) to talk to a trained counselor if you are experiencing a Mental Health or Behavioral Health Crisis or need someone to talk to.  Elida Pulse, RNCM Case Manager [facility], Population Health Direct Dial: [phone]

## 2024-06-21 NOTE — Patient Outreach (Addendum)
 Complex Care Management   Visit Note  06/21/2024  Name:  Tristan Brooks MRN: [id number] DOB: [birth year]  Situation: Referral received for Complex Care Management related to COPD and Diabetes with Complications I obtained verbal consent from Patient.  Visit completed with Tristan Brooks  on the phone. He confirmed he attended Eye MD appt on 06/17/2024 and received new reading glasses. No abnormalities noted around film over right eye. He completed his ABT and prednisone  from PCP but continues to have a nonproductive cough without fever or SOB off baseline. Encouraged him to call PCP office today for recommendations on cough. Encouraged him to go to local Urgent Care if fever develops over the weekend. RNCM scheduled to call back on Monday to check on symptoms. Is scheduled to see PCP in January to have labs drawn but could not remember date.   Background:   Past Medical History:  Diagnosis Date   Aortic stenosis, severe    CHF (congestive heart failure) (HCC)    CKD (chronic kidney disease)    COPD (chronic obstructive pulmonary disease) (HCC)    Diabetes mellitus without complication (HCC)    HLD (hyperlipidemia)    Mass of right lung    Neuropathy     Assessment: Patient Reported Symptoms:  Cognitive Cognitive Status: Alert and oriented to person, place, and time, Insightful and able to interpret abstract concepts, Normal speech and language skills      Neurological Neurological Review of Symptoms: Not assessed    HEENT HEENT Symptoms Reported: Other: HEENT Comment: Continues to have film over right eye. Saw Eye MD on 06/17/2024 and was given new readers. Patient reports Eye MD did not find anything wrong with right eye.    Cardiovascular Cardiovascular Symptoms Reported: Not assessed    Respiratory Respiratory Symptoms Reported: Dry cough Additional Respiratory Details: Pt completed antibiotic and prednisone  from 12/8 PCP visit but continues to have a non-productive cough.  Patient reports breathing at baseline and no fever present. Encouraged him to call PCP for further recommendations for cough.    Endocrine Endocrine Symptoms Reported: No symptoms reported Is patient diabetic?: Yes Is patient checking blood sugars at home?: Yes List most recent blood sugar readings, include date and time of day: Today's FBS =140mg /dL Endocrine Self-Management Outcome: 3 (uncertain)  Gastrointestinal Gastrointestinal Symptoms Reported: Not assessed      Genitourinary Genitourinary Symptoms Reported: Not assessed    Integumentary Integumentary Symptoms Reported: Not assessed    Musculoskeletal Musculoskelatal Symptoms Reviewed: Not assessed        Psychosocial Psychosocial Symptoms Reported: Not assessed          06/21/2024    PHQ2-9 Depression Screening   Little interest or pleasure in doing things    Feeling down, depressed, or hopeless    PHQ-2 - Total Score    Trouble falling or staying asleep, or sleeping too much    Feeling tired or having little energy    Poor appetite or overeating     Feeling bad about yourself - or that you are a failure or have let yourself or your family down    Trouble concentrating on things, such as reading the newspaper or watching television    Moving or speaking so slowly that other people could have noticed.  Or the opposite - being so fidgety or restless that you have been moving around a lot more than usual    Thoughts that you would be better off dead, or hurting yourself in some way  PHQ2-9 Total Score    If you checked off any problems, how difficult have these problems made it for you to do your work, take care of things at home, or get along with other people    Depression Interventions/Treatment      There were no vitals filed for this visit.    Medications Reviewed Today   Medications were not reviewed in this encounter     Recommendation:   PCP Follow-up Continue Current Plan of Care Go to Urgent Care if  you develop a fever or SOB worsens over the weekend.  Follow Up Plan:   Telephone follow-up in 1 week Telephone follow up appointment date/time:  06/24/2024@1230   Elida Pulse, RNCM Case Manager [facility], [facility] Health Direct Dial: [phone]

## 2024-06-24 ENCOUNTER — Telehealth: Payer: Self-pay

## 2024-06-28 ENCOUNTER — Telehealth: Payer: Self-pay

## 2024-07-02 ENCOUNTER — Other Ambulatory Visit (HOSPITAL_BASED_OUTPATIENT_CLINIC_OR_DEPARTMENT_OTHER): Payer: Self-pay

## 2024-07-02 MED ORDER — EZETIMIBE 10 MG PO TABS
10.0000 mg | ORAL_TABLET | Freq: Every day | ORAL | 0 refills | Status: AC
  Filled 2024-07-02: qty 90, 90d supply, fill #0

## 2024-07-02 MED ORDER — PIOGLITAZONE HCL 30 MG PO TABS
30.0000 mg | ORAL_TABLET | Freq: Every day | ORAL | 0 refills | Status: AC
  Filled 2024-07-02: qty 90, 90d supply, fill #0

## 2024-07-02 MED ORDER — NOVOLOG FLEXPEN 100 UNIT/ML ~~LOC~~ SOPN
3.0000 [IU] | PEN_INJECTOR | Freq: Three times a day (TID) | SUBCUTANEOUS | 2 refills | Status: AC
  Filled 2024-07-02: qty 6, 56d supply, fill #0

## 2024-07-02 MED ORDER — FUROSEMIDE 20 MG PO TABS
20.0000 mg | ORAL_TABLET | Freq: Every day | ORAL | 0 refills | Status: AC
  Filled 2024-07-02: qty 90, 90d supply, fill #0

## 2024-07-02 MED ORDER — GLIPIZIDE ER 5 MG PO TB24
10.0000 mg | ORAL_TABLET | Freq: Every day | ORAL | 3 refills | Status: AC
  Filled 2024-07-02: qty 180, 90d supply, fill #0

## 2024-07-02 MED ORDER — INSULIN GLARGINE 100 UNIT/ML SOLOSTAR PEN
45.0000 [IU] | PEN_INJECTOR | Freq: Two times a day (BID) | SUBCUTANEOUS | 0 refills | Status: AC
  Filled 2024-07-02: qty 90, 100d supply, fill #0

## 2024-07-02 MED ORDER — PANTOPRAZOLE SODIUM 40 MG PO TBEC
40.0000 mg | DELAYED_RELEASE_TABLET | Freq: Every day | ORAL | 30 refills | Status: AC
  Filled 2024-07-02: qty 30, 30d supply, fill #0

## 2024-07-02 MED ORDER — PREDNISONE 20 MG PO TABS: 20.0000 mg | ORAL_TABLET | Freq: Two times a day (BID) | ORAL | 0 refills | Status: AC

## 2024-07-02 MED ORDER — ATORVASTATIN CALCIUM 20 MG PO TABS
20.0000 mg | ORAL_TABLET | Freq: Every evening | ORAL | 1 refills | Status: AC
  Filled 2024-07-02: qty 90, 90d supply, fill #0

## 2024-07-02 MED ORDER — GABAPENTIN 100 MG PO CAPS
100.0000 mg | ORAL_CAPSULE | Freq: Two times a day (BID) | ORAL | 0 refills | Status: AC
  Filled 2024-07-02 – 2024-07-05 (×2): qty 180, 90d supply, fill #0

## 2024-07-02 MED ORDER — UMECLIDINIUM-VILANTEROL 62.5-25 MCG/ACT IN AEPB
1.0000 | INHALATION_SPRAY | Freq: Every morning | RESPIRATORY_TRACT | 6 refills | Status: AC
  Filled 2024-07-02: qty 60, 30d supply, fill #0

## 2024-07-02 MED ORDER — ALBUTEROL SULFATE HFA 108 (90 BASE) MCG/ACT IN AERS
1.0000 | INHALATION_SPRAY | RESPIRATORY_TRACT | 5 refills | Status: AC | PRN
  Filled 2024-07-02: qty 20.1, 100d supply, fill #0

## 2024-07-02 MED ORDER — FERROUS SULFATE 325 (65 FE) MG PO TABS
325.0000 mg | ORAL_TABLET | Freq: Every morning | ORAL | 3 refills | Status: AC
  Filled 2024-07-02: qty 90, 90d supply, fill #0

## 2024-07-05 ENCOUNTER — Other Ambulatory Visit (HOSPITAL_BASED_OUTPATIENT_CLINIC_OR_DEPARTMENT_OTHER): Payer: Self-pay

## 2024-07-08 ENCOUNTER — Other Ambulatory Visit (HOSPITAL_BASED_OUTPATIENT_CLINIC_OR_DEPARTMENT_OTHER): Payer: Self-pay

## 2024-07-08 ENCOUNTER — Other Ambulatory Visit (HOSPITAL_COMMUNITY): Payer: Self-pay

## 2024-07-08 ENCOUNTER — Other Ambulatory Visit: Payer: Self-pay

## 2024-07-08 NOTE — Patient Outreach (Addendum)
 Complex Care Management   Visit Note  07/08/2024  Name:  Tristan Brooks MRN: [id number] DOB: [birth year]  Situation: Referral received for Complex Care Management related to COPD, DM with stage 3 CKD.  I obtained verbal consent from Patient.  Visit completed with Tristan Brooks  on the phone  Background:   Past Medical History:  Diagnosis Date   Aortic stenosis, severe    CHF (congestive heart failure) (HCC)    CKD (chronic kidney disease)    COPD (chronic obstructive pulmonary disease) (HCC)    Diabetes mellitus without complication (HCC)    HLD (hyperlipidemia)    Mass of right lung    Neuropathy     Assessment: Patient Reported Symptoms:  Cognitive Cognitive Status: Alert and oriented to person, place, and time, Insightful and able to interpret abstract concepts, Normal speech and language skills      Neurological Neurological Review of Symptoms: No symptoms reported    HEENT HEENT Symptoms Reported: Other: HEENT Comment: Now established with eye MD for follow up.    Cardiovascular Cardiovascular Symptoms Reported: Swelling in legs or feet Cardiovascular Comment: Edema in bilateral LE at baseline.  Respiratory Respiratory Symptoms Reported: No symptoms reported    Endocrine Endocrine Symptoms Reported: No symptoms reported Is patient diabetic?: Yes Endocrine Comment: Was not able to report blood sugar today, ran out of strips, he will call pharmacy to refill.  Gastrointestinal Gastrointestinal Symptoms Reported: No symptoms reported      Genitourinary Genitourinary Symptoms Reported: Not assessed    Integumentary Additional Integumentary Details: Now estabished with a Podiatrist to have toenails trimmed every 3-4 months or as needed. Skin Management Strategies: Routine screening  Musculoskeletal Additional Musculoskeletal Details: Continues to use his cane and walker.  Denies falls.        Psychosocial Psychosocial Symptoms Reported: Not assessed           07/08/2024    PHQ2-9 Depression Screening   Little interest or pleasure in doing things    Feeling down, depressed, or hopeless    PHQ-2 - Total Score    Trouble falling or staying asleep, or sleeping too much    Feeling tired or having little energy    Poor appetite or overeating     Feeling bad about yourself - or that you are a failure or have let yourself or your family down    Trouble concentrating on things, such as reading the newspaper or watching television    Moving or speaking so slowly that other people could have noticed.  Or the opposite - being so fidgety or restless that you have been moving around a lot more than usual    Thoughts that you would be better off dead, or hurting yourself in some way    PHQ2-9 Total Score    If you checked off any problems, how difficult have these problems made it for you to do your work, take care of things at home, or get along with other people    Depression Interventions/Treatment      There were no vitals filed for this visit.    Medications Reviewed Today   Medications were not reviewed in this encounter     Recommendation:   Continue Current Plan of Care  Follow Up Plan:   Telephone follow-up 07/29/24 11:00am.  Santana Stamp BSN, CCM [place]  [facility] Population Health RN Care Manager Direct Dial: [phone]  Fax: [phone]

## 2024-07-08 NOTE — Patient Instructions (Signed)
 Visit Information  Thank you for taking time to visit with me today. Please don't hesitate to contact me if I can be of assistance to you before our next scheduled appointment.  Your next care management appointment is by telephone on Monday, February 9th at 11:00am.   Please call the care guide team at [phone] if you need to cancel, schedule, or reschedule an appointment.   Please call the [place]  National Suicide Prevention Lifeline: (661)563-2190 or TTY: (617) 875-5779 TTY 564 652 4692) to talk to a trained counselor if you are experiencing a Mental Health or Behavioral Health Crisis or need someone to talk to.  Santana Stamp BSN, CCM [place]  [facility] Population Health RN Care Manager Direct Dial: [phone]  Fax: [phone]

## 2024-07-11 ENCOUNTER — Other Ambulatory Visit (HOSPITAL_BASED_OUTPATIENT_CLINIC_OR_DEPARTMENT_OTHER): Payer: Self-pay

## 2024-07-24 ENCOUNTER — Other Ambulatory Visit (HOSPITAL_BASED_OUTPATIENT_CLINIC_OR_DEPARTMENT_OTHER): Payer: Self-pay

## 2024-07-26 ENCOUNTER — Other Ambulatory Visit (HOSPITAL_COMMUNITY): Payer: Self-pay

## 2024-07-26 ENCOUNTER — Other Ambulatory Visit (HOSPITAL_BASED_OUTPATIENT_CLINIC_OR_DEPARTMENT_OTHER): Payer: Self-pay

## 2024-07-29 ENCOUNTER — Telehealth
